# Patient Record
Sex: Male | Born: 1935 | Race: White | Hispanic: No | Smoking: Former smoker
Health system: Southern US, Community
[De-identification: ages and names within clinical notes are randomized; demographics above are authoritative.]

## PROBLEM LIST (undated history)

## (undated) DIAGNOSIS — Z8679 Personal history of other diseases of the circulatory system: Secondary | ICD-10-CM

## (undated) DIAGNOSIS — E785 Hyperlipidemia, unspecified: Secondary | ICD-10-CM

## (undated) DIAGNOSIS — J449 Chronic obstructive pulmonary disease, unspecified: Secondary | ICD-10-CM

## (undated) DIAGNOSIS — I10 Essential (primary) hypertension: Secondary | ICD-10-CM

## (undated) DIAGNOSIS — Z87898 Personal history of other specified conditions: Secondary | ICD-10-CM

## (undated) DIAGNOSIS — N529 Male erectile dysfunction, unspecified: Secondary | ICD-10-CM

## (undated) HISTORY — DX: Hyperlipidemia, unspecified: E78.5

## (undated) HISTORY — PX: JOINT REPLACEMENT: SHX530

## (undated) HISTORY — DX: Personal history of other diseases of the circulatory system: Z86.79

## (undated) HISTORY — DX: Personal history of other specified conditions: Z87.898

## (undated) HISTORY — DX: Chronic obstructive pulmonary disease, unspecified: J44.9

## (undated) HISTORY — DX: Male erectile dysfunction, unspecified: N52.9

## (undated) HISTORY — DX: Essential (primary) hypertension: I10

---
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
## 1999-04-13 HISTORY — PX: LITHOTRIPSY: SUR834

## 1999-04-13 HISTORY — PX: CATARACT EXTRACTION, BILATERAL: SHX1313

## 1999-06-08 ENCOUNTER — Ambulatory Visit (HOSPITAL_COMMUNITY): Admission: RE | Admit: 1999-06-08 | Discharge: 1999-06-08 | Payer: Self-pay | Admitting: Urology

## 1999-08-20 ENCOUNTER — Ambulatory Visit (HOSPITAL_BASED_OUTPATIENT_CLINIC_OR_DEPARTMENT_OTHER): Admission: RE | Admit: 1999-08-20 | Discharge: 1999-08-20 | Payer: Self-pay | Admitting: *Deleted

## 1999-08-20 ENCOUNTER — Encounter (INDEPENDENT_AMBULATORY_CARE_PROVIDER_SITE_OTHER): Payer: Self-pay | Admitting: *Deleted

## 1999-09-23 ENCOUNTER — Encounter (INDEPENDENT_AMBULATORY_CARE_PROVIDER_SITE_OTHER): Payer: Self-pay | Admitting: Specialist

## 1999-09-23 ENCOUNTER — Ambulatory Visit (HOSPITAL_BASED_OUTPATIENT_CLINIC_OR_DEPARTMENT_OTHER): Admission: RE | Admit: 1999-09-23 | Discharge: 1999-09-23 | Payer: Self-pay | Admitting: *Deleted

## 2002-04-23 ENCOUNTER — Ambulatory Visit (HOSPITAL_COMMUNITY): Admission: RE | Admit: 2002-04-23 | Discharge: 2002-04-23 | Payer: Self-pay | Admitting: Cardiology

## 2005-04-12 HISTORY — PX: TOTAL HIP ARTHROPLASTY: SHX124

## 2006-06-20 ENCOUNTER — Ambulatory Visit: Payer: Self-pay | Admitting: Internal Medicine

## 2006-08-15 ENCOUNTER — Inpatient Hospital Stay (HOSPITAL_COMMUNITY): Admission: EM | Admit: 2006-08-15 | Discharge: 2006-08-18 | Payer: Self-pay | Admitting: Emergency Medicine

## 2006-08-15 ENCOUNTER — Ambulatory Visit: Payer: Self-pay | Admitting: Internal Medicine

## 2006-09-06 ENCOUNTER — Ambulatory Visit: Payer: Self-pay | Admitting: Internal Medicine

## 2006-12-19 ENCOUNTER — Ambulatory Visit: Payer: Self-pay | Admitting: Internal Medicine

## 2007-05-23 ENCOUNTER — Ambulatory Visit (HOSPITAL_BASED_OUTPATIENT_CLINIC_OR_DEPARTMENT_OTHER): Admission: RE | Admit: 2007-05-23 | Discharge: 2007-05-23 | Payer: Self-pay | Admitting: Orthopedic Surgery

## 2007-06-19 ENCOUNTER — Ambulatory Visit: Payer: Self-pay | Admitting: Internal Medicine

## 2007-06-19 DIAGNOSIS — J439 Emphysema, unspecified: Secondary | ICD-10-CM | POA: Insufficient documentation

## 2007-08-20 ENCOUNTER — Encounter: Payer: Self-pay | Admitting: Internal Medicine

## 2007-09-28 ENCOUNTER — Ambulatory Visit: Payer: Self-pay | Admitting: Internal Medicine

## 2007-12-13 ENCOUNTER — Ambulatory Visit: Payer: Self-pay | Admitting: Internal Medicine

## 2008-05-08 ENCOUNTER — Telehealth: Payer: Self-pay | Admitting: Internal Medicine

## 2008-12-11 ENCOUNTER — Ambulatory Visit: Payer: Self-pay | Admitting: Internal Medicine

## 2009-04-18 ENCOUNTER — Telehealth: Payer: Self-pay | Admitting: Internal Medicine

## 2010-01-09 ENCOUNTER — Ambulatory Visit: Payer: Self-pay | Admitting: Internal Medicine

## 2010-01-09 DIAGNOSIS — E785 Hyperlipidemia, unspecified: Secondary | ICD-10-CM | POA: Insufficient documentation

## 2010-02-25 ENCOUNTER — Ambulatory Visit: Payer: Self-pay | Admitting: Cardiology

## 2010-03-27 ENCOUNTER — Ambulatory Visit: Payer: Self-pay | Admitting: Cardiology

## 2010-05-14 NOTE — Assessment & Plan Note (Signed)
Summary: rov 1 yr ///kp-Pt not seen-[facility] appt  Pt [facility] appt due to having another appt he had to get to.Reynaldo Minium CMA  December 10, 2009 10:56 AM  CC:  yearly follow up visit-COPD; denies any SOB or wheezing..  Preventive Screening-Counseling & Management  Alcohol-Tobacco     Smoking Status: quit     Year Quit: 2002     Pack years: 1ppd x 60 years  Current Medications (verified): 1)  Hytrin 2mg  .... Take 1 Tablet By Mouth Once A Day 2)  Zocor 80 Mg Tabs (Simvastatin) .... Take One By Mouth Once Daily 3)  Combivent 103-18 Mcg/act  Aero (Ipratropium-Albuterol) .... Inhale 2 Puffs Every 4 Hours As Needed 4)  Spiriva Handihaler 18 Mcg  Caps (Tiotropium Bromide Monohydrate) .... Inhale Contents of 1 Capsule Once A Day  Allergies (verified): 1)  ! Penicillin 2)  ! * Flu Vaccination  Social History: Pack years:  1ppd x 60 years  Vital Signs:  Patient profile:   77 year old male Height:      74 inches Weight:      216 pounds BMI:     27.83 O2 Sat:      94 % on Room air Pulse rate:   63 / minute BP sitting:   140 / 60  (left arm) Cuff size:   regular  Vitals Entered By: Reynaldo Minium CMA (December 10, 2009 10:02 AM)  O2 Flow:  Room air CC: yearly follow up visit-COPD; denies any SOB or wheezing.    Other Orders: No Charge Patient Arrived (NCPA0) (NCPA0)

## 2010-05-14 NOTE — Assessment & Plan Note (Signed)
Summary: rov 1 yr ///kp   Primary Provider/Referring Provider:  Delfin Edis  CC:  Annual Followup COPD.  Breathing okay and denies any complaints today.Marland Kitchen  History of Present Illness: 12/13/07- 77 year old man returning for follow-up of COPD.  We reviewed his medications and discussed upcoming flu vaccine this season.  He has a history of side effects with flu vaccine once before.he denies any significant change in his breathing.  Specifically, he denies change in cough, purulent or bloody sputum, chest pain or palpitation, adenopathy, or fever, leg pain, or edema.  December 27, 2008- COPD Uneventful year with no problems through cataract surgery recently. Denies cough, phlegm, chest pain, palpitation. Follows with DrTennant and says BP good with no cardiac concerns. Continues Spiriva, using combivent 2-3 x/ week, mostly when his nose is stuffy, with little chest tightness. Has never had pneumonia vac.  January 09, 2010- COPD Discussed how he has outlived 2 wives. Has done well with no major flares. He declines flu shot "I always get the flu from it". Uses daily Spiriva with no problems and only occasional Combivent. No routine cough or phlegm. Not aware of dyspnea, non limiting. He observes that he never tries to run. Dr Deborah Chalk serves as his primary doctor without cardiac problem. Admits rare cigarette- none in 6 months. May smoke one if he is around someone smoking. I explained why we feel it would be best if he stopped completely.   Preventive Screening-Counseling & Management  Alcohol-Tobacco     Smoking Status: quit  Comments: Rare social cigarette, once or twice a year.  Current Medications (verified): 1)  Hytrin 2mg  .... Take 1 Tablet By Mouth Once A Day 2)  Zocor 80 Mg Tabs (Simvastatin) .... Take One By Mouth Once Daily 3)  Combivent 103-18 Mcg/act  Aero (Ipratropium-Albuterol) .... Inhale 2 Puffs Every 4 Hours As Needed 4)  Spiriva Handihaler 18 Mcg  Caps (Tiotropium Bromide  Monohydrate) .... Inhale Contents of 1 Capsule Once A Day  Allergies (verified): 1)  ! Penicillin 2)  ! * Flu Vaccination  Past History:  Past Surgical History: Last updated: 2008-12-27 Carpal tunnel sgy R hip replacement tonsils cataract surgery  Family History: Last updated: 12-27-2008 Mother-deceased age 7; old age. Father- deceased age 1; cancer Brother-deceased age 55; killed by drunk driver Brother- living age 46 Brother-living age 45 Brother- living age 81 ]  Social History: Last updated: 10/11/2007 Patient states former smoker.- cigars. Quit 6 years ago ETOH- 2 drinks per day Widowed with 4 children .  Risk Factors: Smoking Status: quit (01/09/2010)  Past Medical History: COPD pneumonia Hyperlipidemia  Review of Systems      See HPI       The patient complains of shortness of breath with activity.  The patient denies shortness of breath at rest, productive cough, non-productive cough, coughing up blood, chest pain, irregular heartbeats, acid heartburn, indigestion, loss of appetite, weight change, abdominal pain, difficulty swallowing, sore throat, tooth/dental problems, headaches, nasal congestion/difficulty breathing through nose, sneezing, itching, ear ache, rash, change in color of mucus, and fever.    Vital Signs:  Patient profile:   77 year old male Weight:      214.25 pounds O2 Sat:      92 % on Room air Pulse rate:   67 / minute BP sitting:   150 / 70  (left arm)  Vitals Entered By: Vernie Murders (January 09, 2010 9:23 AM)  O2 Flow:  Room air CC: Annual Followup COPD.  Breathing okay and denies  any complaints today.   Physical Exam  Additional Exam:  General: A/Ox3; pleasant and cooperative, NAD, cigarette burn mark on shirt, no odor of tobacco. SKIN: no rash, lesions NODES: no lymphadenopathy HEENT: [place]/AT, EOM- WNL, Conjuctivae- clear, PERRLA, TM-WNL, Nose- clear, Throat- clear and wnl, Mallampati II NECK: Supple w/ fair ROM, JVD-  none, normal carotid impulses w/o bruits Thyroid-  CHEST: Clear to P&A, very clear with no cough, rub or dullness HEART: RRR, no m/g/r heard ABDOMEN: Soft and nl;  EAV:WUJW, nl pulses, no edema  NEURO: Grossly intact to observation      Impression & Recommendations:  Problem # 1:  COPD (ICD-496) He is well controlled and using meds appropriately. I encourage flu shot and strict smoking abstention, but he is convinced flu vaccine is not good for him, and smokes a very rare social cigarette that is not reallistically likely to do him in. We are updating his scripts.  We discussed his breathing in light of upcoming trip to [place] and [place].I did recommend abstinence from smoking.  Other Orders: Est. Patient Level III (11914)  Patient Instructions: 1)  Please schedule a follow-up appointment in 1 year. 2)  Please call if I can be helpful 3)  Scripts refilled. Prescriptions: SPIRIVA HANDIHALER 18 MCG  CAPS (TIOTROPIUM BROMIDE MONOHYDRATE) Inhale contents of 1 capsule once a day  #90 x 3   Entered and Authorized by:   Waymon Budge MD   Signed by:   Waymon Budge MD on 01/09/2010   Method used:   Print then Give to Patient   RxID:   [id number] COMBIVENT 103-18 MCG/ACT  AERO (IPRATROPIUM-ALBUTEROL) inhale 2 puffs every 4 hours as needed  #3 x 3   Entered and Authorized by:   Waymon Budge MD   Signed by:   Waymon Budge MD on 01/09/2010   Method used:   Print then Give to Patient   RxID:   [id number]

## 2010-05-14 NOTE — Progress Notes (Signed)
Summary: rx  Phone Note Call from Patient Call back at Home Phone [phone]   Caller: Patient Call For: Allen Beasley Reason for Call: Refill Medication Summary of Call: need written rx for Spiriva 90 day supply with refills for pt to send to mail order pharmacy.  Please mail to pt's home address. Initial call taken by: Eugene Gavia,  April 18, 2009 11:25 AM  Follow-up for Phone Call        rx given to CY to sign, will mail to pt house once signed. Pt aware.Carron Curie CMA  April 18, 2009 12:46 PM     Prescriptions: SPIRIVA HANDIHALER 18 MCG  CAPS (TIOTROPIUM BROMIDE MONOHYDRATE) Inhale contents of 1 capsule once a day  #90 x 3   Entered by:   Carron Curie CMA   Authorized by:   Waymon Budge MD   Signed by:   Carron Curie CMA on 04/18/2009   Method used:   Print then Mail to Patient   RxID:   [id number]

## 2010-07-31 ENCOUNTER — Other Ambulatory Visit: Payer: Self-pay | Admitting: *Deleted

## 2010-07-31 DIAGNOSIS — Z79899 Other long term (current) drug therapy: Secondary | ICD-10-CM

## 2010-07-31 DIAGNOSIS — E7800 Pure hypercholesterolemia, unspecified: Secondary | ICD-10-CM

## 2010-08-05 ENCOUNTER — Encounter: Payer: Self-pay | Admitting: *Deleted

## 2010-08-05 DIAGNOSIS — Z8679 Personal history of other diseases of the circulatory system: Secondary | ICD-10-CM | POA: Insufficient documentation

## 2010-08-05 DIAGNOSIS — Z87898 Personal history of other specified conditions: Secondary | ICD-10-CM | POA: Insufficient documentation

## 2010-08-05 DIAGNOSIS — N529 Male erectile dysfunction, unspecified: Secondary | ICD-10-CM | POA: Insufficient documentation

## 2010-08-05 DIAGNOSIS — I10 Essential (primary) hypertension: Secondary | ICD-10-CM

## 2010-08-07 ENCOUNTER — Ambulatory Visit (INDEPENDENT_AMBULATORY_CARE_PROVIDER_SITE_OTHER): Payer: Medicare Other | Admitting: Cardiology

## 2010-08-07 ENCOUNTER — Encounter: Payer: Self-pay | Admitting: Cardiology

## 2010-08-07 ENCOUNTER — Other Ambulatory Visit (INDEPENDENT_AMBULATORY_CARE_PROVIDER_SITE_OTHER): Payer: Medicare Other | Admitting: *Deleted

## 2010-08-07 DIAGNOSIS — E7800 Pure hypercholesterolemia, unspecified: Secondary | ICD-10-CM

## 2010-08-07 DIAGNOSIS — E785 Hyperlipidemia, unspecified: Secondary | ICD-10-CM

## 2010-08-07 DIAGNOSIS — Z79899 Other long term (current) drug therapy: Secondary | ICD-10-CM

## 2010-08-07 DIAGNOSIS — I10 Essential (primary) hypertension: Secondary | ICD-10-CM

## 2010-08-07 DIAGNOSIS — Z8679 Personal history of other diseases of the circulatory system: Secondary | ICD-10-CM

## 2010-08-07 LAB — LIPID PANEL
Cholesterol: 131 mg/dL (ref 0–200)
LDL Cholesterol: 65 mg/dL (ref 0–99)

## 2010-08-07 LAB — HEPATIC FUNCTION PANEL: Albumin: 4.1 g/dL (ref 3.5–5.2)

## 2010-08-07 LAB — BASIC METABOLIC PANEL
BUN: 28 mg/dL — ABNORMAL HIGH (ref 6–23)
Calcium: 10 mg/dL (ref 8.4–10.5)
Creatinine, Ser: 0.9 mg/dL (ref 0.4–1.5)
GFR: 87.56 mL/min (ref >60.00)
Glucose, Bld: 108 mg/dL — ABNORMAL HIGH (ref 70–99)
Sodium: 139 mEq/L (ref 135–145)

## 2010-08-07 NOTE — Assessment & Plan Note (Signed)
He had very mild aortic insufficiency by echocardiography in 2006. He clinically doesn't have a murmur.

## 2010-08-07 NOTE — Assessment & Plan Note (Signed)
Pressure slightly elevated today but in general it's been satisfactory. I will have him continue his current medicines with followup with Dr. Jacky Kindle.

## 2010-08-07 NOTE — Progress Notes (Signed)
Subjective:   Allen Beasley comes in today for followup visit. In general, he's been doing well he's returned from a trip to all [place] and [place]. He has occasional shortness of breath but no chest pain headache or dizziness. Lab work is pending today. We discussed about my retirement and I'll ask him to see primary care with Dr. Jacky Kindle.  He's had bilateral cataract extractions and lens implants in August and September of 2011. He has a history of hypertensive heart disease and hypercholesterolemia has been well managed. He has underlying COPD and has a right hip prosthesis from 1997. He had lithotripsy in 2001 tonsillectomy 1932. He was hospitalized for chest pain in 1996 at [facility] I care for his wife, Johnny Bridge, and we discussed those times.  Current Outpatient Prescriptions  Medication Sig Dispense Refill  . Ipratropium-Albuterol (COMBIVENT IN) Inhale 2 puffs into the lungs as needed.        Marland Kitchen losartan (COZAAR) 50 MG tablet Take 50 mg by mouth daily.        . sildenafil (VIAGRA) 50 MG tablet Take 25 mg by mouth. 1/2 tablet as needed       . simvastatin (ZOCOR) 80 MG tablet Take 80 mg by mouth at bedtime.        Marland Kitchen terazosin (HYTRIN) 2 MG capsule Take 2 mg by mouth at bedtime.        Marland Kitchen tiotropium (SPIRIVA) 18 MCG inhalation capsule Place 18 mcg into inhaler and inhale daily.          Allergies  Allergen Reactions  . Penicillins     Patient Active Problem List  Diagnoses  . HYPERLIPIDEMIA  . COPD  . HTN (hypertension)  . Erectile dysfunction  . History of chest pain  . History of aortic insufficiency    History  Smoking status  . Former Smoker -- 1.0 packs/day for 59 years  . Types: Cigarettes  . Quit date: 08/04/2001  Smokeless tobacco  . Not on file    History  Alcohol Use  . Yes    patient admits to alcohol unknown amout    Family History  Problem Relation Age of Onset  . Cancer Father   . Heart disease Father   . Heart attack Father     Review of Systems:    The patient denies any heat or cold intolerance.  No weight gain or weight loss.  The patient denies headaches or blurry vision.  There is no cough or sputum production.  The patient denies dizziness.  There is no hematuria or hematochezia.  The patient denies any muscle aches or arthritis.  The patient denies any rash.  The patient denies frequent falling or instability.  There is no history of depression or anxiety.  All other systems were reviewed and are negative.   Physical Exam:   Weight is 211. Blood pressure today is 158/68 off of medications today.The head is normocephalic and atraumatic.  Pupils are equally round and reactive to light.  Sclerae nonicteric.  Conjunctiva is clear.  Oropharynx is unremarkable.  There's adequate oral airway.  Neck is supple there are no masses.  Thyroid is not enlarged.  There is no lymphadenopathy.  Lungs are clear.  Chest is symmetric.  Heart shows a regular rate and rhythm.  S1 and S2 are normal.  There is no murmur click or gallop.  Abdomen is soft normal bowel sounds.  There is no organomegaly.  Genital and rectal deferred.  Extremities are without edema.  Peripheral pulses are adequate.  Neurologically intact.  Full range of motion.  The patient is not depressed.  Skin is warm and dry.  Assessment / Plan:

## 2010-08-07 NOTE — Assessment & Plan Note (Signed)
Refills were given on his lipid-lowering medicines and lab work is checked today. He is on Zocor 80.

## 2010-08-08 LAB — VITAMIN D 25 HYDROXY (VIT D DEFICIENCY, FRACTURES): Vit D, 25-Hydroxy: 17 ng/mL — ABNORMAL LOW (ref 30–89)

## 2010-08-10 ENCOUNTER — Telehealth: Payer: Self-pay | Admitting: Cardiology

## 2010-08-10 NOTE — Telephone Encounter (Signed)
Needs a Rx refill on Hytrin 2 mg instead of the cholesterol med that he asked for on Friday. Please call into [facility].

## 2010-08-11 ENCOUNTER — Telehealth: Payer: Self-pay | Admitting: *Deleted

## 2010-08-11 NOTE — Telephone Encounter (Signed)
Message copied by Barnetta Hammersmith on Tue Aug 11, 2010 12:49 PM ------      Message from: Roger Shelter      Created: Fri Aug 07, 2010  2:40 PM       OK

## 2010-08-11 NOTE — Telephone Encounter (Signed)
RN faxed new script for Hytrin 2 mg one po daily at bedtime #90 with 3 refills to [facility].

## 2010-08-11 NOTE — Progress Notes (Signed)
Does he need to start Vit D?

## 2010-08-12 NOTE — Telephone Encounter (Signed)
Pt notified of lab results and will f/u with Dr. Jacky Kindle for Vitamin D replacement.  RN faxed labs to Dr. Jacky Kindle.

## 2010-08-12 NOTE — Progress Notes (Signed)
Vitamin D 17

## 2010-08-13 ENCOUNTER — Telehealth: Payer: Self-pay | Admitting: *Deleted

## 2010-08-13 NOTE — Telephone Encounter (Signed)
Labs reported 

## 2010-08-25 NOTE — Op Note (Signed)
[name], Allen                ACCOUNT NO.:  [id number]   MEDICAL RECORD NO.:  [id number]          PATIENT TYPE:  AMB   LOCATION:  DSC                          FACILITY:  [facility]   PHYSICIAN:  Katy Fitch. Sypher, M.D. DATE OF BIRTH:  1923/05/03   DATE OF PROCEDURE:  05/23/2007  DATE OF DISCHARGE:                               OPERATIVE REPORT   PREOPERATIVE DIAGNOSIS:  Chronic severe right carpal tunnel syndrome.   POSTOPERATIVE DIAGNOSIS:  Chronic severe right carpal tunnel syndrome.   OPERATIONS:  Release of right transverse carpal ligament.   OPERATING SURGEON:  Josephine Igo, M.D.   ASSISTANT:  Annye Rusk, PA-C.   ANESTHESIA:  General by LMA.   SUPERVISING ANESTHESIOLOGIST:  Dr. Autumn Patty.   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 Delfin Edis  for evaluation and management of bilateral hand numbness and pain.  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   Electrodiagnostic studies confirmed bilateral severe carpal tunnel  syndrome.  A sed rate was obtained that was elevated at 32.  Uric acid  level was obtained and was noted to be normal.   Due to failure to respond to nonoperative measures, [name] is brought  to the operating room at this time for release of his right transverse  carpal ligament.   PROCEDURE:  [name] is brought to the operating room and placed  in supine position on the table.   Following the induction of general anesthesia by LMA technique, the  right arm was prepped with Betadine soap solution, sterilely draped.  A  pneumatic tourniquet was applied to the proximal right brachium.   Following exsanguination of right arm with Esmarch bandage, arterial  tourniquet was inflated to 250 mmHg due to mild systolic hypertension.   Procedure commenced with a short incision in line of the ring finger in  the palm.  The subcutaneous tissues were carefully divided revealing the  palmar fascia.  This was split in line of its fibers to reveal the  common sensory branch of the median nerve and the distal portion of the  ulnar bursa.  The bursa was quite edematous and inflamed.  This had the  appearance of a possible rheumatoid synovium.   The transverse carpal ligament isolated from the flexor tendons and the  median nerve.  The ligament was released along its ulnar border  extending into the distal forearm.  This widely opened the carpal canal.  No mass or other pigments were noted.   Bleeding points were electrocauterized by bipolar current followed by  repair of the skin with intradermal 3-0 Prolene.   Mr. Mathey tolerated surgery and anesthesia well.  For aftercare he is  provided a prescription for Percocet 5 mg one by mouth every four to six  hours as needed for pain 20 tablets without refill.  Also doxycycline  100  mg one by mouth twice daily x4 days as a perioperative prophylactic  antibiotic.      Katy Fitch Sypher, M.D.  Electronically Signed     RVS/MEDQ  D:  05/23/2007  T:  05/24/2007  Job:  [id number]

## 2010-08-25 NOTE — Assessment & Plan Note (Signed)
[place]  [facility]                             PULMONARY OFFICE NOTE   Allen Beasley, Allen Beasley                       MRN:          [id number]  DATE:09/06/2006                            DOB:          April 10, 1924    PROBLEM LIST:  1. Chronic obstructive pulmonary disease.  2. Recent pneumonia.   HISTORY:  He was hospitalized May 5 through May 9 with chronic  obstructive pulmonary disease exacerbation and pneumonia. He has now  finished antibiotics and returns for hospital followup reporting that he  feels fine. He shows me a Medrol 4 mg Dosepak that he got an Urgent  Care in February but never opened. We talked about its use. He does not  need it now. Chest x-ray May 7 at [facility] had shown improving aeration in  the left lung base with a bibasilar patchy atelectatic infiltrate. There  was a question of a nodular shadow or nipple shadow in the right lung  base and followup was recommended. He is coughing up a little sputum  now, no blood or purulent material, no chest pain. Feet have not been  swelling. He shows me an obvious bruise on his left deltoid area but  does not remember how he got it. He is not taking aspirin or  anticoagulants.   MEDICATIONS:  1. Hytrin 2 mg.  2. Vytorin.  3. Micardis 80 mg.  4. Combivent rescue inhaler.   He uses Dr. Deborah Chalk as his primary physician saying he has no history of  heart disease.   OBJECTIVE:  VITAL SIGNS:  Weight 207 pounds, blood pressure 136/62,  pulse 74, room air saturation 92%. A bruise about the size of a 50 cent  piece on his left deltoid.  LUNGS:  Breath sounds are diminished in the bases and cough with  laughter is mildly congested but not productive. There is no real  dullness, no rub.  HEART:  Sounds are regular, I do not hear murmur or gallops.  EXTREMITIES:  There is no cyanosis or clubbing, no edema.   IMPRESSION:  Chronic obstructive pulmonary disease after recent  exacerbation with pneumonia.   PLAN:  1. Chest x-ray for followup of his hospital film.  2. Try sample Spiriva 1 inhalation daily.  3. Schedule return in 4 months, earlier p.r.n.     Clinton D. Maple Hudson, MD, Tonny Bollman, FACP  Electronically Signed    CDY/MedQ  DD: 09/06/2006  DT: 09/07/2006  Job #: [id number]   cc:   Colleen Can. Deborah Chalk, M.D.

## 2010-08-25 NOTE — Assessment & Plan Note (Signed)
[place] [facility]                             PULMONARY OFFICE NOTE   Allen Beasley, CUEVA                       MRN:          [id number]  DATE:12/19/2006                            DOB:          [birth year]    PROBLEM:  Chronic obstructive pulmonary disease.   HISTORY:  Tolerated the hot summer weather without major problems, but  was bothered attempting to visit at altitude in [place] where exercise  tolerance was quite limited.  He does not cough up much phlegm and feels  stable now with no new issues.  Combivent inhaler is usually sufficient.  He tried Spiriva, but did not see an advantage.   MEDICATIONS:  1. Hytrin 2 mg.  2. Vytorin.  3. Spiriva, available, but not used regularly.  4. Combivent p.r.n.   ALLERGIES:  PENICILLIN AND FLU VACCINE.   OBJECTIVE:  VITAL SIGNS:  Weight 219 pounds, BP 120/70, pulse 60, room  air saturation 95%.  LUNGS:  Rattling cough.  Breath sounds are equal.  I do not hear  dullness, rub or rales.  NECK:  Neck veins are not distended.  There is no stridor.  HEART:  Regular without murmur.  EXTREMITIES:  No peripheral edema.   Chest x-ray in May had shown COPD with bibasilar atelectasis or  infiltrate at the time of pneumonia.  There is a questionable nodule or  summation shadow in the right lung base.  Study was repeated on May 27,  with no definite nodules.  Study repeated for this visit described as  stable exam with hyperinflation, interstitial chronic change and basilar  atelectasis or scarring.  Pulmonary function testing showed moderate  obstructive airways disease with some response to bronchodilator, normal  lung volumes, but moderately reduced diffusion.  FEV-1 was 1.92 (66%  predicted), ratio 0.49.  Flow volume lobe supports impression of  primarily emphysema.   IMPRESSION:  Chronic obstructive pulmonary disease/emphysema.  Followup  chest x-rays x2 have not demonstrated a nodule.   PLAN:  We refilled  Combivent and also Spiriva so that he would have it  available this winter.  Scheduled to return in 6 months, earlier p.r.n.     Clinton D. Maple Hudson, MD, Tonny Bollman, FACP  Electronically Signed    CDY/MedQ  DD: 12/25/2006  DT: 12/26/2006  Job #: [id number]   cc:   Colleen Can. Deborah Chalk, M.D.

## 2010-08-28 NOTE — Assessment & Plan Note (Signed)
[place] [facility]                             PULMONARY OFFICE NOTE   Allen Beasley, Allen Beasley                       MRN:          [id number]  DATE:06/20/2006                            DOB:          20-May-1923    PROBLEM:  1. Chronic obstructive pulmonary disease.  2. Coronary disease.   HISTORY:  I had last seen this gentleman at [facility] in  2005.  He is a former smoker, who was using Combivent as a  bronchodilator, having failed Advair and Spiriva.  He had worked with  Dr. Deborah Chalk for his coronary disease.  Pulmonary function testing in  2004 had shown mild obstruction with an FEV1 of 73% predicted.  He comes  now, having been treated at [facility] for pneumonia.  A  chest x-ray report from May 30, 2006, describes probable lingular  pneumonia with followup recommended.  He was referred here and he said  he feels much better now, back to normal.  Minor cough with no regular  sputum, no blood, no chest pain.  He is a little aware of dyspnea.  He  has had no ankle edema or adenopathy.  Only very occasionally has needed  his Combivent.   MEDICATION:  1. Hytrin.  2. Vytorin.  3. Combivent.   DRUG INTOLERANCE:  PENICILLIN and FLU VACCINE.   He had had his first pneumococcal vaccine in 2004.   OBJECTIVE:  Blood pressure 146/72, pulse 73, room air saturation 95%.  There is a slight raspy quality to his lung sounds, only when he  laughed, once.  Otherwise, chest sounded clear to quiet auscultation  with no dullness or rub.  Heart sounds were regular and normal.  There  was no adenopathy, no edema.   IMPRESSION:  COPD with resolved pneumonia in the lingula.   PLAN:  We are scheduling pulmonary function test and he will return in  mid-summer, earlier p.r.n.  He will need a pneumococcal vaccine booster  next year and we will want to get a followup chest x-ray.  All of this  was reviewed with him and he was in  agreement.     Clinton D. Maple Hudson, MD, Tonny Bollman, FACP  Electronically Signed   CDY/MedQ  DD: 06/23/2006  DT: 06/25/2006  Job #: [id number]   cc:   Louanna Raw

## 2010-08-28 NOTE — Discharge Summary (Signed)
Allen Beasley, Allen Beasley                ACCOUNT NO.:  [id number]   MEDICAL RECORD NO.:  [id number]          PATIENT TYPE:  INP   LOCATION:  5504                         FACILITY:  [facility]   PHYSICIAN:  Clinton D. Maple Hudson, MD, FCCP, FACPDATE OF BIRTH:  05-25-25   DATE OF ADMISSION:  08/15/2006  DATE OF DISCHARGE:  08/18/2006                               DISCHARGE SUMMARY   DISCHARGE DIAGNOSES:  1. Chronic obstructive pulmonary disease with acute exacerbation.  2. Pneumonia and organism not otherwise specified.  3. Hip joint replacement, status post.  4. History of skin malignancy.  5. Leukocytosis.   BRIEF HISTORY:  An 77 year old gentleman followed by Dr. Roger Shelter  for primary care and presenting with a 2-day history of increasing  dyspnea, productive cough, white sputum, low-grade fever, without chest  pain or hemoptysis.  Chest x-ray suggested bilateral pneumonia and he  was admitted for stabilization.   PAST MEDICAL HISTORY:  1. COPD.  2. Replacement, right hip.  3. Resection, basal cell carcinoma.  4. Previous tobacco abuse, ended 5 years ago.   PHYSICAL EXAM ON ADMISSION:  VITAL SIGNS:  Temperature 101.7,  respiratory rate 20-24, oxygen saturation 97% on a non-rebreather mask.  CHEST:  Hyperinflated chest, diminished breath sounds, faint crackles.  CARDIAC:  Regular heart sounds.  EXTREMITIES:  No edema or cyanosis.   HOSPITAL COURSE:  He was admitted to a medical bed and begun on Avelox  intravenously while continuing his outpatient medications and  supplemental oxygen.  He gradually progressed and was able to cough up a  large plug.  At discharge he remained somewhat congested but  substantially improved with little residual sputum.  Chest x-ray showed  some clearing.  Right lower lobe opacity marked for follow-up chest x-  ray.   LABORATORY:  Normal sinus rhythm on EKG.  Chest x-ray showed bibasilar  atelectatic infiltrative changes and mild improvement in  aeration of the  left lung base, question a nodule or summation shadow in the right lung  base.  The initial film had shown airspace disease more prominent on the  left but not excluding pneumonia.  Admission white blood count was  14,300 with a hemoglobin of 16.2 and normal platelet count, neutrophils  93%.  Admission BUN 23, creatinine 0.9.  B-natriuretic peptide less than  30.  Muscle enzymes showed no evidence of injury.   DISCHARGE PLANS:  He is to follow up with Dr. Maple Hudson in 2-3 weeks for  post-hospital follow-up and to see Dr. Deborah Chalk, who is listed as his  primary physician, as previously scheduled or needed.  Diet and activity  as tolerated.  He remains off cigarettes.   1. Avelox 400 mg daily for five more days.  2. Hytrin 2 mg daily.  3. Vytorin 10/40 mg one daily.  4. Rescue inhaler, either Combivent or albuterol 2 puffs q.i.d. p.r.n.   Activity as tolerated.      Clinton D. Maple Hudson, MD, Tonny Bollman, FACP  Electronically Signed     CDY/MEDQ  D:  09/28/2006  T:  09/29/2006  Job:  [id number]   cc:  Colleen Can. Deborah Chalk, M.D.

## 2010-08-28 NOTE — Op Note (Signed)
[place]  Patient:    GATLYN, LIPARI                       MRN: [id number] Proc. Date: 08/20/99 Adm. Date:  [id number] Attending:  Melody Comas.                           Operative Report  PREOPERATIVE DIAGNOSES: 1. Basal cell carcinoma of the nose, 1.0 cm. 2. 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  PROCEDURES PERFORMED: 1. Excisional biopsy of 1.0 cm basal cell carcinoma of the nasal dorsum. 2. Excisional biopsy of 1.0 cm Bowens disease of the right helical rim of the    auricle.  SURGEON:  Janet Berlin. Dan Humphreys, M.D.  ASSISTANT:  None.  ANESTHESIA:  Local.  INDICATIONS:  Brentlee Delage is a 77 year old man referred to me with previously biopsied nasal dorsum and right auricular helical rim skin lesions.  These were showing to be consistent with basal cell carcinoma and Bowens disease, respectively.  He was taken to operating room at this time for excisional biopsy.  DESCRIPTION OF OPERATION:  The patient was brought back to the minor surgery operating suite in the [facility] and placed on the table in a supine position with the head turned slightly to the left.  The right auricle and nose were sterilely prepped and draped.  I used a sterile surgical marking pen to outline the elliptically shaped excisions that I planned to perform with grossly clear tissue margins.  These were oriented, respectively, along the long axis of the helical rim and the long axis of the nasal dorsum. I then infiltrated each of these areas with 1% Xylocaine containing epinephrine.  After a suitable level of anesthesia had been obtained, I carried out excision first of the right auricular skin lesion.  This was excised full-thickness and oriented with a suture at its superior margin for the patient.  The specimen was passed off the operative field.  I closed the resulting defect with a running 5-0 monofilament nylon suture.  Attention  was then directed to the nasal dorsum, where the lesion was excised in a similar fashion.   This specimen was also oriented with a nylon suture at its superior margin.  Of note, was the fact that the patients dorsal nasal skin was heavily sebaceous and that quite a bit of cerumen was expressed as these sutures passed through the skin.  The defect created by the excision was closed with a running 5-0 monofilament nylon suture.  Bacitracin ointment was applied to each of the lesions, which were then covered with sterile band-aids. The patient was given wound care instructions.  I will see him back in the office next week for suture removal and review of his final pathology report. DD:  08/20/99 TD:  08/21/99 Job: [id number]

## 2010-08-28 NOTE — H&P (Signed)
Allen Beasley, DOWNS NO.:  [id number]   MEDICAL RECORD NO.:  [id number]          PATIENT TYPE:  EMS   LOCATION:  MAJO                         FACILITY:  [facility]   PHYSICIAN:  Oley Balm. Sung Amabile, MD   DATE OF BIRTH:  1923/09/16   DATE OF ADMISSION:  08/15/2006  DATE OF DISCHARGE:                              HISTORY & PHYSICAL   ADMISSION DIAGNOSES:  1. Chronic obstructive pulmonary disease.  2. Community-acquired pneumonia.   HISTORY OF PRESENT ILLNESS:  Mr. Allen Beasley is an 77 year old gentleman who  presents with a 2-day history of increasing shortness of breath, cough  productive of white sputum and subjective fevers.  He denies chest pain,  pressure, tightness and heaviness.  He has had no hemoptysis.  He denies  lower extremity edema and calf tenderness.  He denies nausea, vomiting,  diarrhea and dysuria.  He presented to the emergency department due to  increasing shortness of breath.  Chest x-ray suggested bilateral basilar  pneumonia.  Consequently pulmonary medicine is asked to admit him.  He  is followed by Dr. Jetty Duhamel for COPD and his primary care physician  is Dr. Roger Shelter.   PAST MEDICAL HISTORY:  1. COPD.  2. Total right hip replacement.  3. Basal cell carcinoma removed.   CURRENT MEDICATIONS:  1. Hytrin 2 mg p.o. q.h.s.  2. Micardis 80 mg p.o. q. day.  3. Vytorin unknown dose daily.  4. Combivent inhaler 2 sprays q.4h. p.r.n.   SOCIAL HISTORY:  He smoked a pack of cigarettes per day until  approximately five years ago.  He has no history of significant  occupational exposures.   FAMILY HISTORY:  Previously reviewed and noncontributory.   REVIEW OF SYSTEMS:  As per the history of present illness.  It is  otherwise unremarkable.   PHYSICAL EXAMINATION:  GENERAL:  He is well-developed and well-nourished  and in no overt distress.  Cognitive function is intact.  VITAL SIGNS:  Current temperature is 101.7.  Other vital signs are  normal with respiratory rate of 20-24 per minute.  Oxygen saturation 97%  on a nonrebreather mask.  HEENT:  Cranial nerves intact.  NECK:  Supple without adenopathy or jugular venous distention.  CHEST:  Examination reveals hyperresonance to percussion throughout.  Breath sounds are moderately diminished with faint bilateral basilar  crackles.  There is no egophony or bronchophony.  CARDIOVASCULAR:  Cardiac exam reveals a regular rate and rhythm with no  murmurs.  ABDOMEN: Soft, nontender with normal bowel sounds.  EXTREMITIES: Without clubbing, cyanosis and edema.  NEUROLOGIC:  Exam is without focal deficits.   DATA:  Chest x-ray reveals changes of COPD with bilateral basilar  infiltrates consistent with pneumonia.  No prior chest x-rays are  available for comparison.  B natruretic peptide is less than 30.  Chemistries are unremarkable.  White blood cell count is elevated at  14,300.  Cardiac panel is normal.   IMPRESSION:  1. Underlying chronic obstructive pulmonary disease - previous records      from Dr. Maple Hudson indicate that this is mild in severity.  His  baseline exercise tolerance is very good.  2. Community-acquired pneumonia, organism unknown - this most likely      represents a bacterial infection.   PLAN:  Due to as high oxygen requirements, I am going to admit him to a  step-down unit.  I suspect that he can be transferred to regular bed  very soon.  He will be treated with Avelox intravenously.  Nebulized  bronchodilators will be scheduled.  The rest of this medical regimen  will be maintained as is.  DVT prophylaxis will be provided with low  molecular weight heparin.      Oley Balm Sung Amabile, MD  Electronically Signed     DBS/MEDQ  D:  08/15/2006  T:  08/16/2006  Job:  [id number]   cc:   Colleen Can. Deborah Chalk, M.D.  Clinton D. Maple Hudson, MD, FCCP, FACP

## 2010-08-28 NOTE — Op Note (Signed)
[place]  Patient:    Allen Beasley, Allen Beasley                       MRN: [id number] Proc. Date: 09/23/99 Adm. Date:  [id number] Disc. Date: [id number] Attending:  Melody Comas.                           Operative Report  PREOPERATIVE DIAGNOSIS:  Bowens disease at the right helical rim.  POSTOPERATIVE DIAGNOSIS:  Bowens disease at the right helical rim.  OPERATION PERFORMED:  A 3 cm re-excision of area of Bowens disease of right auricular helical rim.  SURGEON:  Janet Berlin. Dan Humphreys, M.D.  ANESTHESIA:  Local.  INDICATIONS FOR PROCEDURE:  The patient is a couple of weeks out from excision of an area of Bowens disease involving the helical rim of his right ear. Final pathology showed the margins at the midpoint and the inferior extent of the incision to be potentially involved.  He was taken back to the operating room at this time for re-excision of the wound in attempt to establish clear margins.  DESCRIPTION OF PROCEDURE:  The patient was brought back to the minor surgery operating room at the [facility].  He was placed on the table in the supine position with the head turned to the left.  I sterilely prepped and draped the right ear.  I designed an elliptically shaped excision oriented along the long axis of the helical rim.  This fully incorporated the previous excision site and measured 3 cm in length.   I infiltrated the area to be excised with 1% Xylocaine containing epinephrine.  The skin was then excised full thickness as previously diagramed.  I placed a suture along the superior extent of the specimen to orient it for the pathologist.  The specimen was passed off the operative field for permanent sections.  The resulting defect was closed with a running 5-0 Monofilament nylon suture. This was dressed with bacitracin ointment and sterile surgical gauze, immobilized into position with surgical tape.  The patient was given  wound care instructions.  I will see him back in the office early next week for wound check, suture removal and review of his final pathology report. DD:  09/23/99 TD:  09/28/99 Job: [id number]

## 2010-11-30 ENCOUNTER — Telehealth: Payer: Self-pay | Admitting: Nurse Practitioner

## 2011-01-01 LAB — POCT HEMOGLOBIN-HEMACUE: Hemoglobin: 13.8

## 2011-01-06 ENCOUNTER — Encounter: Payer: Self-pay | Admitting: Internal Medicine

## 2011-01-08 ENCOUNTER — Ambulatory Visit: Payer: Self-pay | Admitting: Internal Medicine

## 2011-01-11 ENCOUNTER — Ambulatory Visit: Payer: Self-pay | Admitting: Internal Medicine

## 2011-02-12 ENCOUNTER — Ambulatory Visit (INDEPENDENT_AMBULATORY_CARE_PROVIDER_SITE_OTHER): Payer: Medicare Other | Admitting: Internal Medicine

## 2011-02-12 ENCOUNTER — Encounter: Payer: Self-pay | Admitting: Internal Medicine

## 2011-02-12 ENCOUNTER — Ambulatory Visit (INDEPENDENT_AMBULATORY_CARE_PROVIDER_SITE_OTHER)
Admission: RE | Admit: 2011-02-12 | Discharge: 2011-02-12 | Disposition: A | Discharge status: Home / Self Care | Payer: Medicare Other | Source: Ambulatory Visit | Attending: Internal Medicine | Admitting: Internal Medicine

## 2011-02-12 VITALS — BP 110/58 | HR 54 | Ht 74.0 in | Wt 216.0 lb

## 2011-02-12 DIAGNOSIS — J449 Chronic obstructive pulmonary disease, unspecified: Secondary | ICD-10-CM

## 2011-02-12 DIAGNOSIS — J4489 Other specified chronic obstructive pulmonary disease: Secondary | ICD-10-CM

## 2011-02-12 MED ORDER — SILDENAFIL CITRATE 50 MG PO TABS: ORAL_TABLET | ORAL | Status: DC

## 2011-02-12 NOTE — Patient Instructions (Addendum)
Order(ed)   CXR- dx COPD  Refill Viagra to be sent to [facility]  Since you are doing so well, it would be fine to follow with Dr Jacky Kindle for all your future needs and see me again only if needed for your breathing.

## 2011-02-12 NOTE — Progress Notes (Signed)
02/12/11- 77 year old male former smoker followed for COPD, complicated by history of aortic insufficiency, HBP LOV- 01/02/10 He declines flu vaccine which he says makes him sick. His newly established for primary care with Dr. Jacky Kindle. He denies any respiratory problems in the past year. Still smoking one cigarette every 6 months or so. Denies routine cough discolored sputum, chest pain, blood or nodes. Has traveled to [place] and [place]. Still uses Spiriva daily. Last chest x-ray 12/19/2006.  ROS-see HPI Constitutional:   No-   weight loss, night sweats, fevers, chills, fatigue, lassitude. HEENT:   No-  headaches, difficulty swallowing, tooth/dental problems, sore throat,       No-  sneezing, itching, ear ache, nasal congestion, post nasal drip,  CV:  No-   chest pain, orthopnea, PND, swelling in lower extremities, anasarca, dizziness, palpitations Resp: No-   shortness of breath with exertion or at rest.              No-   productive cough,  No non-productive cough,  No- coughing up of blood.              No-   change in color of mucus.  No- wheezing.   Skin: No-   rash or lesions. GI:  No-   heartburn, indigestion, abdominal pain, nausea, vomiting, diarrhea,                 change in bowel habits, loss of appetite GU: No-   dysuria, change in color of urine, no urgency or frequency.  No- flank pain. MS:  No-   joint pain or swelling.  No- decreased range of motion.  No- back pain. Neuro-     nothing unusual Psych:  No- change in mood or affect. No depression or anxiety.  No memory loss.  OBJ General- Alert, Oriented, Affect-appropriate, Distress- none acute, laconic, comfortable appearing Skin- rash-none, lesions- none, excoriation- none Lymphadenopathy- none Head- atraumatic            Eyes- Gross vision intact, PERRLA, conjunctivae clear secretions            Ears- Hearing, canals-normal            Nose- Clear, no-Septal dev, mucus, polyps, erosion, perforation   Throat- Mallampati II , mucosa clear , drainage- none, tonsils- atrophic Neck- flexible , trachea midline, no stridor , thyroid nl, carotid no bruit Chest - symmetrical excursion , unlabored           Heart/CV- RRR , no murmur , no gallop  , no rub, nl s1 s2                           - JVD- none , edema- none, stasis changes- none, varices- none           Lung- clear to P&A, wheeze- none, cough- none , dullness-none, rub- none           Chest wall-  Abd- tender-no, distended-no, bowel sounds-present, HSM- no Br/ Gen/ Rectal- Not done, not indicated Extrem- cyanosis- none, clubbing, none, atrophy- none, strength- nl Neuro- grossly intact to observation

## 2011-02-13 NOTE — Assessment & Plan Note (Signed)
He has been comfortable on Spiriva alone with no exacerbations in the past year. We don't really expect his 2 cigarettes per year to be important, although not encouraged. After discussion, I suggested he follow now with Dr. Jacky Kindle. We will get a chest x-ray today and report, otherwise he'll be happy to see him again as needed.

## 2011-02-17 NOTE — Progress Notes (Signed)
Quick Note:  LMTCB ______ 

## 2011-02-19 NOTE — Progress Notes (Signed)
Quick Note:  Pt aware of results. ______ 

## 2011-05-19 NOTE — Telephone Encounter (Signed)
Closed enc.

## 2011-05-28 DIAGNOSIS — J189 Pneumonia, unspecified organism: Secondary | ICD-10-CM | POA: Diagnosis not present

## 2011-05-28 DIAGNOSIS — J9801 Acute bronchospasm: Secondary | ICD-10-CM | POA: Diagnosis not present

## 2011-05-28 DIAGNOSIS — J019 Acute sinusitis, unspecified: Secondary | ICD-10-CM | POA: Diagnosis not present

## 2011-06-12 ENCOUNTER — Encounter (HOSPITAL_COMMUNITY): Payer: Self-pay | Admitting: *Deleted

## 2011-06-12 ENCOUNTER — Emergency Department (HOSPITAL_COMMUNITY): Payer: Medicare Other

## 2011-06-12 ENCOUNTER — Other Ambulatory Visit: Payer: Self-pay

## 2011-06-12 ENCOUNTER — Inpatient Hospital Stay (HOSPITAL_COMMUNITY)
Admission: EM | Admit: 2011-06-12 | Discharge: 2011-06-18 | DRG: 192 | Disposition: A | Discharge status: Skilled Nursing Facility | Payer: Medicare Other | Attending: Internal Medicine | Admitting: Internal Medicine

## 2011-06-12 DIAGNOSIS — Z880 Allergy status to penicillin: Secondary | ICD-10-CM | POA: Diagnosis not present

## 2011-06-12 DIAGNOSIS — R0902 Hypoxemia: Secondary | ICD-10-CM | POA: Diagnosis not present

## 2011-06-12 DIAGNOSIS — R05 Cough: Secondary | ICD-10-CM | POA: Diagnosis not present

## 2011-06-12 DIAGNOSIS — I10 Essential (primary) hypertension: Secondary | ICD-10-CM | POA: Diagnosis not present

## 2011-06-12 DIAGNOSIS — R269 Unspecified abnormalities of gait and mobility: Secondary | ICD-10-CM | POA: Diagnosis not present

## 2011-06-12 DIAGNOSIS — R0602 Shortness of breath: Secondary | ICD-10-CM | POA: Diagnosis not present

## 2011-06-12 DIAGNOSIS — I119 Hypertensive heart disease without heart failure: Secondary | ICD-10-CM | POA: Diagnosis not present

## 2011-06-12 DIAGNOSIS — R918 Other nonspecific abnormal finding of lung field: Secondary | ICD-10-CM | POA: Diagnosis not present

## 2011-06-12 DIAGNOSIS — Z87891 Personal history of nicotine dependence: Secondary | ICD-10-CM

## 2011-06-12 DIAGNOSIS — R279 Unspecified lack of coordination: Secondary | ICD-10-CM | POA: Diagnosis not present

## 2011-06-12 DIAGNOSIS — IMO0002 Reserved for concepts with insufficient information to code with codable children: Secondary | ICD-10-CM

## 2011-06-12 DIAGNOSIS — J984 Other disorders of lung: Secondary | ICD-10-CM | POA: Diagnosis not present

## 2011-06-12 DIAGNOSIS — E785 Hyperlipidemia, unspecified: Secondary | ICD-10-CM | POA: Diagnosis present

## 2011-06-12 DIAGNOSIS — J9600 Acute respiratory failure, unspecified whether with hypoxia or hypercapnia: Secondary | ICD-10-CM | POA: Diagnosis not present

## 2011-06-12 DIAGNOSIS — N400 Enlarged prostate without lower urinary tract symptoms: Secondary | ICD-10-CM | POA: Diagnosis present

## 2011-06-12 DIAGNOSIS — J441 Chronic obstructive pulmonary disease with (acute) exacerbation: Principal | ICD-10-CM | POA: Diagnosis present

## 2011-06-12 DIAGNOSIS — J449 Chronic obstructive pulmonary disease, unspecified: Secondary | ICD-10-CM | POA: Diagnosis not present

## 2011-06-12 DIAGNOSIS — J189 Pneumonia, unspecified organism: Secondary | ICD-10-CM | POA: Diagnosis not present

## 2011-06-12 DIAGNOSIS — J4489 Other specified chronic obstructive pulmonary disease: Secondary | ICD-10-CM | POA: Diagnosis not present

## 2011-06-12 DIAGNOSIS — Z96649 Presence of unspecified artificial hip joint: Secondary | ICD-10-CM

## 2011-06-12 DIAGNOSIS — J439 Emphysema, unspecified: Secondary | ICD-10-CM | POA: Insufficient documentation

## 2011-06-12 DIAGNOSIS — Z5189 Encounter for other specified aftercare: Secondary | ICD-10-CM | POA: Diagnosis not present

## 2011-06-12 LAB — CBC
HCT: 45.5 % (ref 39.0–52.0)
HCT: 42.6 % (ref 39.0–52.0)
Hemoglobin: 15.4 g/dL (ref 13.0–17.0)
Hemoglobin: 14.5 g/dL (ref 13.0–17.0)
MCH: 30.9 pg (ref 26.0–34.0)
MCHC: 34 g/dL (ref 30.0–36.0)
MCV: 91.5 fL (ref 78.0–100.0)
MCV: 90.8 fL (ref 78.0–100.0)
Platelets: 149 K/uL — ABNORMAL LOW (ref 150–400)
RBC: 4.69 MIL/uL (ref 4.22–5.81)
RDW: 13.3 % (ref 11.5–15.5)
WBC: 6.3 10*3/uL (ref 4.0–10.5)
WBC: 5.7 K/uL (ref 4.0–10.5)

## 2011-06-12 LAB — URINALYSIS, ROUTINE W REFLEX MICROSCOPIC
Glucose, UA: NEGATIVE mg/dL
Leukocytes, UA: NEGATIVE
Protein, ur: NEGATIVE mg/dL
Specific Gravity, Urine: 1.023 (ref 1.005–1.030)
Urobilinogen, UA: 0.2 mg/dL (ref 0.0–1.0)

## 2011-06-12 LAB — DIFFERENTIAL
Basophils Relative: 0 % (ref 0–1)
Eosinophils Relative: 1 % (ref 0–5)
Lymphocytes Relative: 17 % (ref 12–46)
Lymphs Abs: 1.1 10*3/uL (ref 0.7–4.0)
Monocytes Relative: 12 % (ref 3–12)
Neutro Abs: 4.3 10*3/uL (ref 1.7–7.7)

## 2011-06-12 LAB — BASIC METABOLIC PANEL
BUN: 29 mg/dL — ABNORMAL HIGH (ref 6–23)
CO2: 28 mEq/L (ref 19–32)
Chloride: 100 mEq/L (ref 96–112)
GFR calc Af Amer: >90 mL/min (ref >90)
Glucose, Bld: 144 mg/dL — ABNORMAL HIGH (ref 70–99)
Potassium: 3.9 mEq/L (ref 3.5–5.1)

## 2011-06-12 LAB — CREATININE, SERUM
Creatinine, Ser: 0.84 mg/dL (ref 0.50–1.35)
GFR calc Af Amer: 90 mL/min — ABNORMAL LOW (ref >90)
GFR calc non Af Amer: 78 mL/min — ABNORMAL LOW (ref >90)

## 2011-06-12 LAB — URINE MICROSCOPIC-ADD ON

## 2011-06-12 MED ORDER — ALBUTEROL SULFATE (5 MG/ML) 0.5% IN NEBU
10.0000 mg | INHALATION_SOLUTION | Freq: Once | RESPIRATORY_TRACT | Status: AC
  Administered 2011-06-12: 10 mg via RESPIRATORY_TRACT
  Filled 2011-06-12: qty 2

## 2011-06-12 MED ORDER — ONDANSETRON HCL 4 MG/2ML IJ SOLN: 4.0000 mg | Freq: Four times a day (QID) | INTRAMUSCULAR | Status: DC | PRN

## 2011-06-12 MED ORDER — ENOXAPARIN SODIUM 40 MG/0.4ML ~~LOC~~ SOLN
40.0000 mg | SUBCUTANEOUS | Status: DC
  Administered 2011-06-16: 40 mg via SUBCUTANEOUS
  Filled 2011-06-12 (×7): qty 0.4

## 2011-06-12 MED ORDER — TERAZOSIN HCL 2 MG PO CAPS
2.0000 mg | ORAL_CAPSULE | Freq: Every day | ORAL | Status: DC
  Administered 2011-06-12 – 2011-06-17 (×6): 2 mg via ORAL
  Filled 2011-06-12 (×7): qty 1

## 2011-06-12 MED ORDER — ALBUTEROL SULFATE (5 MG/ML) 0.5% IN NEBU
2.5000 mg | INHALATION_SOLUTION | RESPIRATORY_TRACT | Status: DC | PRN
  Filled 2011-06-12: qty 0.5

## 2011-06-12 MED ORDER — GUAIFENESIN ER 600 MG PO TB12
600.0000 mg | ORAL_TABLET | Freq: Two times a day (BID) | ORAL | Status: DC
  Administered 2011-06-12 – 2011-06-18 (×12): 600 mg via ORAL
  Filled 2011-06-12 (×13): qty 1

## 2011-06-12 MED ORDER — IPRATROPIUM BROMIDE 0.02 % IN SOLN
0.5000 mg | Freq: Once | RESPIRATORY_TRACT | Status: AC
  Administered 2011-06-12: 0.5 mg via RESPIRATORY_TRACT
  Filled 2011-06-12: qty 2.5

## 2011-06-12 MED ORDER — ATORVASTATIN CALCIUM 40 MG PO TABS
40.0000 mg | ORAL_TABLET | Freq: Every day | ORAL | Status: DC
  Administered 2011-06-13 – 2011-06-17 (×5): 40 mg via ORAL
  Filled 2011-06-12 (×6): qty 1

## 2011-06-12 MED ORDER — ZOLPIDEM TARTRATE 5 MG PO TABS: 5.0000 mg | ORAL_TABLET | Freq: Every evening | ORAL | Status: DC | PRN

## 2011-06-12 MED ORDER — ALBUTEROL SULFATE (5 MG/ML) 0.5% IN NEBU: 5.0000 mg | INHALATION_SOLUTION | Freq: Once | RESPIRATORY_TRACT | Status: DC

## 2011-06-12 MED ORDER — ONDANSETRON HCL 4 MG PO TABS: 4.0000 mg | ORAL_TABLET | Freq: Four times a day (QID) | ORAL | Status: DC | PRN

## 2011-06-12 MED ORDER — ACETAMINOPHEN 325 MG PO TABS
650.0000 mg | ORAL_TABLET | Freq: Four times a day (QID) | ORAL | Status: DC | PRN
  Filled 2011-06-12: qty 1

## 2011-06-12 MED ORDER — METHYLPREDNISOLONE SODIUM SUCC 125 MG IJ SOLR: 80.0000 mg | Freq: Once | INTRAMUSCULAR | Status: DC

## 2011-06-12 MED ORDER — SENNA 8.6 MG PO TABS
1.0000 | ORAL_TABLET | Freq: Two times a day (BID) | ORAL | Status: DC
  Administered 2011-06-13 – 2011-06-16 (×3): 8.6 mg via ORAL
  Filled 2011-06-12 (×13): qty 1

## 2011-06-12 MED ORDER — MOXIFLOXACIN HCL IN NACL 400 MG/250ML IV SOLN
400.0000 mg | INTRAVENOUS | Status: DC
  Administered 2011-06-13 (×2): 400 mg via INTRAVENOUS
  Filled 2011-06-12 (×3): qty 250

## 2011-06-12 MED ORDER — ACETAMINOPHEN 650 MG RE SUPP: 650.0000 mg | Freq: Four times a day (QID) | RECTAL | Status: DC | PRN

## 2011-06-12 MED ORDER — POTASSIUM CHLORIDE IN NACL 20-0.9 MEQ/L-% IV SOLN
INTRAVENOUS | Status: DC
  Administered 2011-06-13: 02:00:00 via INTRAVENOUS
  Administered 2011-06-13: 75 mL via INTRAVENOUS
  Filled 2011-06-12 (×4): qty 1000

## 2011-06-12 MED ORDER — PREDNISONE 20 MG PO TABS
20.0000 mg | ORAL_TABLET | Freq: Every day | ORAL | Status: DC
  Administered 2011-06-13: 20 mg via ORAL
  Filled 2011-06-12 (×3): qty 1

## 2011-06-12 MED ORDER — ALBUTEROL SULFATE (5 MG/ML) 0.5% IN NEBU
5.0000 mg | INHALATION_SOLUTION | Freq: Once | RESPIRATORY_TRACT | Status: AC
  Administered 2011-06-12: 5 mg via RESPIRATORY_TRACT
  Filled 2011-06-12: qty 1

## 2011-06-12 MED ORDER — METHYLPREDNISOLONE SODIUM SUCC 125 MG IJ SOLR
125.0000 mg | Freq: Once | INTRAMUSCULAR | Status: AC
  Administered 2011-06-12: 125 mg via INTRAVENOUS
  Filled 2011-06-12: qty 2

## 2011-06-12 MED ORDER — LOSARTAN POTASSIUM 50 MG PO TABS
50.0000 mg | ORAL_TABLET | Freq: Every day | ORAL | Status: DC
  Administered 2011-06-12 – 2011-06-18 (×7): 50 mg via ORAL
  Filled 2011-06-12 (×7): qty 1

## 2011-06-12 MED ORDER — TIOTROPIUM BROMIDE MONOHYDRATE 18 MCG IN CAPS
18.0000 ug | ORAL_CAPSULE | Freq: Every day | RESPIRATORY_TRACT | Status: DC
  Administered 2011-06-14 – 2011-06-18 (×5): 18 ug via RESPIRATORY_TRACT
  Filled 2011-06-12 (×2): qty 5

## 2011-06-12 MED ORDER — ALBUTEROL SULFATE (5 MG/ML) 0.5% IN NEBU: 2.5000 mg | INHALATION_SOLUTION | Freq: Once | RESPIRATORY_TRACT | Status: DC

## 2011-06-12 MED ORDER — ALUM & MAG HYDROXIDE-SIMETH 200-200-20 MG/5ML PO SUSP
30.0000 mL | Freq: Four times a day (QID) | ORAL | Status: DC | PRN
  Administered 2011-06-12: 30 mL via ORAL
  Filled 2011-06-12: qty 30

## 2011-06-12 MED ORDER — ALBUTEROL SULFATE (5 MG/ML) 0.5% IN NEBU
2.5000 mg | INHALATION_SOLUTION | Freq: Four times a day (QID) | RESPIRATORY_TRACT | Status: DC
  Administered 2011-06-12 – 2011-06-15 (×9): 2.5 mg via RESPIRATORY_TRACT
  Filled 2011-06-12 (×10): qty 0.5

## 2011-06-12 NOTE — ED Provider Notes (Signed)
Medical screening examination/treatment/procedure(s) were conducted as a shared visit with non-physician practitioner(s) and myself.  I personally evaluated the patient during the encounter  Patient with recent pneumonia, now cough, and shortness of breath with O2 sats below 80 to he improves with nasal canal oxygen in the ER. He appears to need overnight treatment with bronchodilators and oxygen. Prior to disposition .    Flint Melter, MD 06/12/11 1600

## 2011-06-12 NOTE — ED Notes (Signed)
Pt c/o sob x2 days, reports he was dx in [place] 2 wks ago w/pneumonia, he received an ABX shot IM, a prescription for inhaler, ABX pill x 10 days, and steroid pills. Pt also reports productive cough, unable to get anything up w/coughing but reports green colored mucus from his nose.

## 2011-06-12 NOTE — Progress Notes (Signed)
CHIMAOBI CASEBOLT [id number] Admission Data: 06/12/2011 8:17 PM Attending Provider: Minda Meo, MD  PCP:No primary provider on file. Consults/ Treatment Team:    Allen Beasley is a 78 y.o. male patient admitted from ED awake, alert  & orientated  X 3,  From home alone. VSS - Blood pressure 127/57, pulse 99, temperature 98.5 F (36.9 C), temperature source Oral, resp. rate 18, height 6\' 2"  (1.88 m), weight 87.091 kg (192 lb), SpO2 85.00%., O2   3L nasal cannular,  shortness of breath, no c/o chest pain, no distress noted.IV site WDL: Right AC SL with a transparent dsg that's clean dry and intact.  Allergies:   Allergies  Allergen Reactions  . Penicillins Hives and Swelling     Past Medical History  Diagnosis Date  . Hypertension   . Hyperlipidemia   . COPD (chronic obstructive pulmonary disease)   . Erectile dysfunction   . History of chest pain   . History of aortic insufficiency      Pt orientation to unit, room and routine. Information packet given to patient/family and safety video watched.  Admission INP armband ID verified with patient/family, and in place. SR up x 2, fall risk assessment complete with Patient and family verbalizing understanding of risks associated with falls. Pt verbalizes an understanding of how to use the call bell and to call for help before getting out of bed.  Skin, clean-dry- intact without evidence of bruising, or skin tears.   No evidence of skin break down noted on exam.    Will cont to monitor and assist as needed.  Cindra Eves, RN 06/12/2011 8:17 PM

## 2011-06-12 NOTE — ED Provider Notes (Signed)
History     CSN: [id number]  Arrival date & time 06/12/11  1126   First MD Initiated Contact with Patient 06/12/11 1200      Chief Complaint  Patient presents with  . Shortness of Breath    (Consider location/radiation/quality/duration/timing/severity/associated sxs/prior treatment) HPI  78 year old male with history of COPD who was recently diagnosed with pneumonia 2 weeks ago down in [place]. Patient said initially he was getting antibiotic, and steroid taper course. His symptoms seemed to improve significantly until last night when he noticed increased shortness of breath. He endorsed nonproductive cough.  Patient denies fever, chills, nausea, vomiting, diarrhea, or abdominal pain. He denies chest pain or back pain. He does not use oxygen at home. He is a former smoker but has quit for the past 10-12 years.  Past Medical History  Diagnosis Date  . Hypertension   . Hyperlipidemia   . COPD (chronic obstructive pulmonary disease)   . Erectile dysfunction   . History of chest pain   . History of aortic insufficiency     Past Surgical History  Procedure Date  . Cataract extraction, bilateral 2001  . Lithotripsy 2001  . Tonsilectomy, adenoidectomy, bilateral myringotomy and tubes 1932  . Total hip arthroplasty 07    right    Family History  Problem Relation Age of Onset  . Cancer Father   . Heart disease Father   . Heart attack Father     History  Substance Use Topics  . Smoking status: Former Smoker -- 1.0 packs/day for 59 years    Types: Cigarettes    Quit date: 08/04/2001  . Smokeless tobacco: Not on file  . Alcohol Use: Yes     patient admits to alcohol unknown amout      Review of Systems  All other systems reviewed and are negative.    Allergies  Penicillins  Home Medications   Current Outpatient Rx  Name Route Sig Dispense Refill  . COMBIVENT IN Inhalation Inhale 2 puffs into the lungs as needed.      Marland Kitchen LOSARTAN POTASSIUM 50 MG PO TABS Oral  Take 50 mg by mouth daily.      Marland Kitchen SILDENAFIL CITRATE 50 MG PO TABS  1/2 tablet as needed 50 tablet 3  . SIMVASTATIN 80 MG PO TABS Oral Take 80 mg by mouth at bedtime.      . TERAZOSIN HCL 2 MG PO CAPS Oral Take 2 mg by mouth at bedtime.      Marland Kitchen TIOTROPIUM BROMIDE MONOHYDRATE 18 MCG IN CAPS Inhalation Place 18 mcg into inhaler and inhale daily.        BP 119/51  Pulse 74  Temp(Src) 97.6 F (36.4 C) (Oral)  Resp 24  SpO2 93%  Physical Exam  Nursing note and vitals reviewed. Constitutional: He appears well-developed and well-nourished. No distress.       Awake, alert, nontoxic appearance  HENT:  Head: Atraumatic.  Eyes: Conjunctivae are normal. Right eye exhibits no discharge. Left eye exhibits no discharge.  Neck: Normal range of motion. Neck supple.  Cardiovascular: Normal rate and regular rhythm.   Pulmonary/Chest: No accessory muscle usage. Tachypnea noted. No respiratory distress. He has decreased breath sounds. He has wheezes. He has rhonchi. He has rales. He exhibits no tenderness.  Abdominal: Soft. There is no tenderness. There is no rebound.  Musculoskeletal: He exhibits no tenderness.       ROM appears intact, no obvious focal weakness  Neurological: He is alert.  Skin: Skin is  warm and dry. No rash noted.  Psychiatric: He has a normal mood and affect.    ED Course  Procedures (including critical care time)  Labs Reviewed - No data to display No results found.   No diagnosis found.  Dg Chest 2 View  06/12/2011  *RADIOLOGY REPORT*  Clinical Data: 78 year old male with cough and shortness of breath.  CHEST - 2 VIEW  Comparison: 02/12/2011 and prior chest radiographs dating back to 08/15/2006  Findings: The cardiomediastinal silhouette is unchanged. Chronic opacities/scarring at the lung bases are again noted. There is no evidence of focal airspace disease, pulmonary edema, suspicious pulmonary nodule/mass, pleural effusion, or pneumothorax. No acute bony abnormalities are  identified.  IMPRESSION: No evidence of acute cardiopulmonary disease.  Chronic bibasilar pulmonary changes/scarring.  Original Report Authenticated By: Rosendo Gros, M.D.   Results for orders placed during the hospital encounter of 06/12/11  CBC      Component Value Range   WBC 6.3  4.0 - 10.5 (K/uL)   RBC 4.97  4.22 - 5.81 (MIL/uL)   Hemoglobin 15.4  13.0 - 17.0 (g/dL)   HCT 13.0  86.5 - 78.4 (%)   MCV 91.5  78.0 - 100.0 (fL)   MCH 31.0  26.0 - 34.0 (pg)   MCHC 33.8  30.0 - 36.0 (g/dL)   RDW 69.6  29.5 - 28.4 (%)   Platelets 162  150 - 400 (K/uL)  DIFFERENTIAL      Component Value Range   Neutrophils Relative 70  43 - 77 (%)   Lymphocytes Relative 17  12 - 46 (%)   Monocytes Relative 12  3 - 12 (%)   Eosinophils Relative 1  0 - 5 (%)   Basophils Relative 0  0 - 1 (%)   Neutro Abs 4.3  1.7 - 7.7 (K/uL)   Lymphs Abs 1.1  0.7 - 4.0 (K/uL)   Monocytes Absolute 0.8  0.1 - 1.0 (K/uL)   Eosinophils Absolute 0.1  0.0 - 0.7 (K/uL)   Basophils Absolute 0.0  0.0 - 0.1 (K/uL)   WBC Morphology ATYPICAL LYMPHOCYTES    BASIC METABOLIC PANEL      Component Value Range   Sodium 138  135 - 145 (mEq/L)   Potassium 3.9  3.5 - 5.1 (mEq/L)   Chloride 100  96 - 112 (mEq/L)   CO2 28  19 - 32 (mEq/L)   Glucose, Bld 144 (*) 70 - 99 (mg/dL)   BUN 29 (*) 6 - 23 (mg/dL)   Creatinine, Ser 1.32  0.50 - 1.35 (mg/dL)   Calcium 9.3  8.4 - 44.0 (mg/dL)   GFR calc non Af Amer 83 (*) >90 (mL/min)   GFR calc Af Amer >90  >90 (mL/min)   Dg Chest 2 View  06/12/2011  *RADIOLOGY REPORT*  Clinical Data: 78 year old male with cough and shortness of breath.  CHEST - 2 VIEW  Comparison: 02/12/2011 and prior chest radiographs dating back to 08/15/2006  Findings: The cardiomediastinal silhouette is unchanged. Chronic opacities/scarring at the lung bases are again noted. There is no evidence of focal airspace disease, pulmonary edema, suspicious pulmonary nodule/mass, pleural effusion, or pneumothorax. No acute bony  abnormalities are identified.  IMPRESSION: No evidence of acute cardiopulmonary disease.  Chronic bibasilar pulmonary changes/scarring.  Original Report Authenticated By: Rosendo Gros, M.D.    Date: 06/12/2011  Rate: 73  Rhythm: normal sinus rhythm  QRS Axis: normal  Intervals: normal  ST/T Wave abnormalities: normal  Conduction Disutrbances:none  Narrative Interpretation:  Old EKG Reviewed: unchanged      MDM  Recent diagnosis of pneumonia, likely community acquired as patient denies any recent hospitalization. Initially his oxygenation saturation was 81% on room air. Patient is currently on 2-L of NS.  Oxygenation improves to 92%. He appears to be in no acute respiratory distress. He is able to speak 5-10 words per sentence. Mildly increased respiratory rate to 24.  CXR, ECG, UA, CBC, BMP ordered.  Albuterol/atrovent nebs and solu-medrol given.  No hx of CHF or having lower extremities swelling today.    12:43 PM Chest x-ray shows no evidence of acute cardiopulmonary disease.  BUN mildly elevated at 29, but that's his baseline.  Plan for admission due to hypoxia.  Discussed plan with my attending, who will contact [facility] for admission.  Pt is aware of plan.  He is currently in no obvious resp distress.   4:02 PM [facility] oncall doctor, Dr. Neale Burly, who agrees to see pt in ED.    Fayrene Helper, PA-C 06/12/11 1608

## 2011-06-12 NOTE — H&P (Signed)
NAMEBALEN, WOOLUM NO.:  [id number]  MEDICAL RECORD NO.:  [id number]  LOCATION:  5507                         FACILITY:  [facility]  PHYSICIAN:  Gaspar Garbe, M.D.DATE OF BIRTH:  06/15/1923  DATE OF ADMISSION:  06/12/2011 DATE OF DISCHARGE:                             HISTORY & PHYSICAL   CHIEF COMPLAINT:  COPD exacerbation.  HISTORY OF PRESENT ILLNESS:  The patient is an 78 year old white male, former smoker, who is normally followed by Dr. Jacky Kindle at [facility].  He indicates that he was down in [place] in the [address] area visiting his brother and 2 weeks ago was treated for pneumonia.  He was given antibiotics as well as prednisone.  He has returned to [facility] and felt reasonably well until last night when he became more short of breath.  He indicated that he took his Spiriva this morning, but did not try anything else and continued to have shortness of breath, and thus came to the emergency room.  When he was initially seen, his sats were in the low 80s on room air.  He was not able to be weaned off of oxygen and had 1 set of nebulizer treatments.  No steroids were provided by the ER physician/resident, and I was asked to come, admit the patient.  Upon seeing the patient, he did not look to be in any distress, but his breathing was somewhat coarsened and medications were initiated as below.  He is currently by himself and does not have any family members with him.  PAST MEDICAL HISTORY:  Essential hypertension, hyperlipidemia, osteoarthritis, COPD.  PAST SURGICAL HISTORY:  Hip replacement in 1997.  SOCIAL HISTORY:  The patient is widowed.  He has 4 sons, all in good health.  He has a Education officer, community, and he is retired from the [employer].  He has worked for the [employer] for 20+ years.  FAMILY HISTORY:  Father died at age 20 from prostate cancer with a history of hypertension and coronary artery disease.  Mother died at  age 19 from natural causes with a history of cancer, and a brother who was killed by a drunk driver.  He has another brother who has a history of memory loss and another who is younger than him, who is in a good state of health.  MEDICATIONS: 1. Terazosin 2 mg p.o. at bedtime. 2. Zocor 80 mg at bedtime. 3. Spiriva 18 mcg 1 inhalation daily. 4. Combivent 2 puffs as needed. 5. Losartan 50 mg once daily.  ALLERGIES:  PENICILLIN causes swelling of the throat.  REVIEW OF SYSTEMS:  The patient denies any fevers, chills, or sweats. Denies any headache.  Indicates shortness of breath and dyspnea on exertion with some wheezing earlier today.  Denies any chest pain.  No abdominal symptoms.  Otherwise, neurologically intact with no other distress.  REVIEW OF SYSTEMS:  Otherwise, within normal limits.  PHYSICAL EXAMINATION:  VITAL SIGNS:  Temperature 97.6, pulse 63, respiratory rate 16, blood pressure 109/48, satting 87% on 2 L.  He was brought up to 4 L upon my arrival. GENERAL:  In no acute distress. HEENT:  Normocephalic, atraumatic.  PERRLA.  EOMI.  ENT is within normal limits. NECK:  Supple.  No lymphadenopathy, JVD, or bruit. HEART:  Regular rate and rhythm. LUNGS:  The patient has decreased breath sounds bilaterally and wheezes with forced expiration.  He has no other adventitial sounds. ABDOMEN:  Soft, nontender, normoactive bowel sounds. EXTREMITIES:  No clubbing, cyanosis, or edema. NEUROLOGIC:  The patient is oriented to person, place, and time without deficits. SKIN:  Otherwise intact. MUSCULOSKELETAL:  No focal deficits noted.  No weakness.  IMAGING:  The patient underwent a chest x-ray, which showed no evidence of acute pulmonary disease and chronic bibasilar pulmonary changes and scarring.  LABORATORY TESTING:  White count 6.3, hemoglobin 15.4, hematocrit 45.5. BUN elevated at 29 and creatinine 0.72.  Electrolytes otherwise within normal limits.  Urinalysis showed 0-2  white blood cells and 7-10 red blood cells, but was otherwise dipstick negative.  A 12-lead EKG was normal.  ASSESSMENT AND PLAN: 1. Chronic obstructive pulmonary disease exacerbation.  Given that the     patient is initially short of breath upon my seeing him and has not     received steroids.  I have written for Depo-Medrol and another     course of albuterol.  He will be on a non-telemetry bed upstairs,     and I have written for him to have albuterol every 6 hours and     every 2 hours as needed.  We will continue his Spiriva daily.  He     will also be put on oral prednisone after his initial Depo-Medrol     dose.  He may benefit from inhaled corticosteroid once tapered off     of prednisone.  He clearly does not have a concept of rescue     inhaler and Combivent is clearly the wrong inhaler for him given     that he is on Spiriva at baseline.  He would much better be served     by albuterol and further teaching. 2. Hypertension.  Blood pressure is currently somewhat low, but he is     also prerenal, will receive IV fluids. 3. Prerenal azotemia.  We will hydrate with normal saline with 20 mEq     KCl at 75 mL an hour. 4. Hyperlipidemia.  We will substitute his Zocor 80 from Lipitor 40     per formulae. 5. The patient has Lovenox for deep vein thrombosis prophylaxis. 6. The patient will likely be able to go home in the couple of days if     his breathing continues, requires considerable education on what to     do for rescue.  In addition, he was started on empiric Avelox, and     we will repeat another chest x-ray in the morning.  He indicated     that he was on an antibiotic.  With his PENICILLIN     allergy, I am not exactly sure what he was on because he cannot     remember the name of it and when names were reviewed, he could not     tell what they were.  This was dispensed in [place].  Condition on     admission stable.     Gaspar Garbe, M.D.     RWT/MEDQ  D:   06/12/2011  T:  06/12/2011  Job:  [id number]

## 2011-06-12 NOTE — ED Provider Notes (Signed)
Medical screening examination/treatment/procedure(s) were conducted as a shared visit with non-physician practitioner(s) and myself.  I personally evaluated the patient during the encounter  Flint Melter, MD 06/12/11 [phone]

## 2011-06-12 NOTE — ED Notes (Signed)
Pt brought back from triage. Undressed and in a gown. Cardiac monitor, pulse oximetry and blood pressure cuff on.

## 2011-06-12 NOTE — ED Notes (Signed)
Reports sob x 2 weeks, was dx with pneumonia and took antibiotics for it. Still having productive cough. spo2 81% on room air at triage.

## 2011-06-12 NOTE — ED Notes (Signed)
Westly Pam RT called for tx

## 2011-06-13 ENCOUNTER — Inpatient Hospital Stay (HOSPITAL_COMMUNITY): Payer: Medicare Other

## 2011-06-13 LAB — COMPREHENSIVE METABOLIC PANEL
ALT: 11 U/L (ref 0–53)
Alkaline Phosphatase: 49 U/L (ref 39–117)
CO2: 28 mEq/L (ref 19–32)
Chloride: 103 mEq/L (ref 96–112)
GFR calc Af Amer: >90 mL/min (ref >90)
GFR calc non Af Amer: 80 mL/min — ABNORMAL LOW (ref >90)
Glucose, Bld: 117 mg/dL — ABNORMAL HIGH (ref 70–99)
Potassium: 4 mEq/L (ref 3.5–5.1)
Sodium: 139 mEq/L (ref 135–145)

## 2011-06-13 LAB — CBC
MCHC: 33.4 g/dL (ref 30.0–36.0)
RDW: 13 % (ref 11.5–15.5)

## 2011-06-13 NOTE — Progress Notes (Signed)
Subjective: Doing a bit better.   Still has not had his AM CXR.  Eating ok, not needing extra nebs.  Objective: Vital signs in last 24 hours: Temp:  [97.4 F (36.3 C)-98.5 F (36.9 C)] 97.4 F (36.3 C) (03/03 0517) Pulse Rate:  [55-99] 55  (03/03 0517) Resp:  [16-24] 20  (03/03 0517) BP: (105-127)/(48-57) 105/49 mmHg (03/03 0517) SpO2:  [81 %-100 %] 94 % (03/03 0517) Weight:  [87.091 kg (192 lb)] 87.091 kg (192 lb) (03/02 1820) Weight change:  Last BM Date: 06/12/11  Intake/Output from previous day: 03/02 0701 - 03/03 0700 In: 505 [P.O.:240; I.V.:265] Out: -  Intake/Output this shift:    General appearance: alert, cooperative and appears stated age Throat: lips, mucosa, and tongue normal; teeth and gums normal Resp: Coarse on R, better aeration, still diminished on Left Cardio: regular rate and rhythm, S1, S2 normal, no murmur, click, rub or gallop GI: soft, non-tender; bowel sounds normal; no masses,  no organomegaly Skin: Skin color, texture, turgor normal. No rashes or lesions Neurologic: Grossly normal  Lab Results:  Basename 06/13/11 0643 06/12/11 1813  WBC 6.7 5.7  HGB 13.1 14.5  HCT 39.2 42.6  PLT 180 149*   BMET  Basename 06/13/11 0643 06/12/11 1813 06/12/11 1200  NA 139 -- 138  K 4.0 -- 3.9  CL 103 -- 100  CO2 28 -- 28  GLUCOSE 117* -- 144*  BUN 27* -- 29*  CREATININE 0.77 0.84 --  CALCIUM 8.7 -- 9.3    Studies/Results: Dg Chest 2 View  06/12/2011  *RADIOLOGY REPORT*  Clinical Data: 78 year old male with cough and shortness of breath.  CHEST - 2 VIEW  Comparison: 02/12/2011 and prior chest radiographs dating back to 08/15/2006  Findings: The cardiomediastinal silhouette is unchanged. Chronic opacities/scarring at the lung bases are again noted. There is no evidence of focal airspace disease, pulmonary edema, suspicious pulmonary nodule/mass, pleural effusion, or pneumothorax. No acute bony abnormalities are identified.  IMPRESSION: No evidence of acute  cardiopulmonary disease.  Chronic bibasilar pulmonary changes/scarring.  Original Report Authenticated By: Rosendo Gros, M.D.    Medications:  I have reviewed the patient's current medications. Scheduled:   . albuterol  10 mg Nebulization Once  . albuterol  2.5 mg Nebulization Q6H  . albuterol  2.5 mg Nebulization Once  . albuterol  5 mg Nebulization Once  . atorvastatin  40 mg Oral q1800  . enoxaparin  40 mg Subcutaneous Q24H  . guaiFENesin  600 mg Oral BID  . ipratropium  0.5 mg Nebulization Once  . ipratropium  0.5 mg Nebulization Once  . losartan  50 mg Oral Daily  . methylPREDNISolone (SOLU-MEDROL) injection  125 mg Intravenous Once  . moxifloxacin  400 mg Intravenous Q24H  . predniSONE  20 mg Oral Q breakfast  . senna  1 tablet Oral BID  . terazosin  2 mg Oral QHS  . tiotropium  18 mcg Inhalation Daily  . DISCONTD: albuterol  5 mg Nebulization Once  . DISCONTD: methylPREDNISolone (SOLU-MEDROL) injection  80 mg Intravenous Once   Continuous:   . 0.9 % NaCl with KCl 20 mEq / L 75 mL/hr at 06/13/11 0228   RUE:[id number], acetaminophen, albuterol, alum & mag hydroxide-simeth, ondansetron (ZOFRAN) IV, ondansetron, zolpidem  Assessment/Plan: COPD exacerbation.  Steroids orally, nebs, continue Spiriva, will likely benefit from ICS once off oral steroids as step up.  Instruction on rescue also needed.  CXR pending for this AM, empiric Avelox No other med changes  LOS: 1 day   Veola Cafaro W 06/13/2011, 9:03 AM

## 2011-06-14 MED ORDER — MOXIFLOXACIN HCL 400 MG PO TABS
400.0000 mg | ORAL_TABLET | Freq: Every day | ORAL | Status: DC
  Administered 2011-06-14 – 2011-06-17 (×4): 400 mg via ORAL
  Filled 2011-06-14 (×5): qty 1

## 2011-06-14 MED ORDER — PREDNISONE 20 MG PO TABS
20.0000 mg | ORAL_TABLET | Freq: Two times a day (BID) | ORAL | Status: DC
  Administered 2011-06-14 – 2011-06-18 (×8): 20 mg via ORAL
  Filled 2011-06-14 (×10): qty 1

## 2011-06-14 NOTE — Progress Notes (Signed)
Subjective: Allen Beasley seems to be doing well. He sitting on the edge of the bed and clearly states he felt better than he felt is going to [place]. He is not back to baseline as yet. He does live alone. He is now considering home with home health versus skilled nursing this will occur within the next one to 2 days.  Objective: Vital signs in last 24 hours: Temp:  [97.6 F (36.4 C)-97.9 F (36.6 C)] 97.6 F (36.4 C) (03/04 0522) Pulse Rate:  [61-75] 69  (03/04 0522) Resp:  [18-20] 20  (03/04 0522) BP: (119-126)/(51-64) 126/60 mmHg (03/04 0522) SpO2:  [85 %-94 %] 94 % (03/04 0522) Weight change:   CBG (last 3)  No results found for this basename: GLUCAP:3 in the last 72 hours  Intake/Output from previous day: 03/03 0701 - 03/04 0700 In: 2520 [P.O.:720; I.V.:1800] Out: 250 [Urine:250]  Physical Exam: Patient is awake alert no distress. Good facial symmetry. No JVD or bruits. He does have a very moist cough. Pulmonary exam reveals a prolonged expiratory phase wheezing at the left base few rales at the right base. Cardiovascular exam distant heart sounds normal S1-S2 no murmur. Abdomen is soft and nontender. Extremities no edema intact pulses. Neurologically he is nonlateralizing   Lab Results:  Basename 06/13/11 0643 06/12/11 1813 06/12/11 1200  NA 139 -- 138  K 4.0 -- 3.9  CL 103 -- 100  CO2 28 -- 28  GLUCOSE 117* -- 144*  BUN 27* -- 29*  CREATININE 0.77 0.84 --  CALCIUM 8.7 -- 9.3  MG -- -- --  PHOS -- -- --    Basename 06/13/11 0643  AST 14  ALT 11  ALKPHOS 49  BILITOT 0.3  PROT 5.9*  ALBUMIN 2.9*    Basename 06/13/11 0643 06/12/11 1813 06/12/11 1200  WBC 6.7 5.7 --  NEUTROABS -- -- 4.3  HGB 13.1 14.5 --  HCT 39.2 42.6 --  MCV 90.1 90.8 --  PLT 180 149* --   No results found for this basename: INR, PROTIME   No results found for this basename: CKTOTAL:3,CKMB:3,CKMBINDEX:3,TROPONINI:3 in the last 72 hours No results found for this basename:  TSH,T4TOTAL,FREET3,T3FREE,THYROIDAB in the last 72 hours No results found for this basename: VITAMINB12:2,FOLATE:2,FERRITIN:2,TIBC:2,IRON:2,RETICCTPCT:2 in the last 72 hours  Studies/Results: X-ray Chest Pa And Lateral   06/13/2011  *RADIOLOGY REPORT*  Clinical Data: Cough, pneumonia  CHEST - 2 VIEW  Comparison: Chest radiograph 06/12/2011  Findings: Normal mediastinum and cardiac silhouette.  There is opacity over the left cardiac border which is present on multiple comparison exams.  Lungs are hyperinflated.  No effusion or pneumothorax.  IMPRESSION:  1.  No acute findings or change from prior. 2.  Hyperinflated lungs with chronic basilar opacities.  Original Report Authenticated By: Genevive Bi, M.D.   Dg Chest 2 View  06/12/2011  *RADIOLOGY REPORT*  Clinical Data: 78 year old male with cough and shortness of breath.  CHEST - 2 VIEW  Comparison: 02/12/2011 and prior chest radiographs dating back to 08/15/2006  Findings: The cardiomediastinal silhouette is unchanged. Chronic opacities/scarring at the lung bases are again noted. There is no evidence of focal airspace disease, pulmonary edema, suspicious pulmonary nodule/mass, pleural effusion, or pneumothorax. No acute bony abnormalities are identified.  IMPRESSION: No evidence of acute cardiopulmonary disease.  Chronic bibasilar pulmonary changes/scarring.  Original Report Authenticated By: Rosendo Gros, M.D.     Assessment/Plan: #1 COPD exacerbation currently on steroids antibiotics as well as oral bronchodilators. #2 hyperlipidemia stable  #3 essential  hypertension #4 BPH/boo   LOS: 2 days   Goran Olden A 06/14/2011, 7:26 AM

## 2011-06-14 NOTE — Progress Notes (Signed)
Clinical Social Worker completed the psychosocial assessment, which can be found in the shadow chart. FL-2 completed and placed in shadow chart for MD signature and skilled nursing facility search initiated. CSW will follow up with patient with bed offers.  Genelle Bal, MSW, LCSW [phone]

## 2011-06-14 NOTE — Progress Notes (Signed)
Met with pt re d/c plan, pt plan is for short term rehab. He currently lives alone and would need assistance with ADLs as well as PT/OT for strengthening. Will notify CSW of need for placement, as pt will most likely d/c in next 2 days. Johny Shock RN MPH Case Manager [phone]

## 2011-06-15 MED ORDER — ALBUTEROL SULFATE (5 MG/ML) 0.5% IN NEBU
2.5000 mg | INHALATION_SOLUTION | Freq: Four times a day (QID) | RESPIRATORY_TRACT | Status: DC
  Administered 2011-06-15 – 2011-06-18 (×13): 2.5 mg via RESPIRATORY_TRACT
  Filled 2011-06-15 (×13): qty 0.5

## 2011-06-15 NOTE — Progress Notes (Signed)
Subjective: Patient is on the side of bed and feeling better day by day. He does have significant dyspnea. His O2 sat is range 91-97% on oxygen. He is clearly not back to baseline and recognizes that he'll need to skilled nursing to bridge the gap.  Objective: Vital signs in last 24 hours: Temp:  [97.6 F (36.4 C)-98 F (36.7 C)] 97.6 F (36.4 C) (03/05 0525) Pulse Rate:  [67-80] 67  (03/05 0525) Resp:  [17-20] 18  (03/05 0525) BP: (105-164)/(50-70) 105/61 mmHg (03/05 0525) SpO2:  [90 %-98 %] 91 % (03/05 0738) Weight change:   CBG (last 3)  No results found for this basename: GLUCAP:3 in the last 72 hours  Intake/Output from previous day: 03/04 0701 - 03/05 0700 In: 840 [P.O.:840] Out: 400 [Urine:400]  Physical Exam: Patient is awake alert sitting and is out of bed wearing his oxygen. He utilizes no accessory muscles. No JVD or bruits. Lungs reveal coarse breath sounds at the left base, prolonged expiratory phase otherwise clear. Heart sounds are distant. No peripheral edema. Intact distal pulses. Abdomen is benign. Neurologic exam is normal.   Lab Results:  Basename 06/13/11 0643 06/12/11 1813 06/12/11 1200  NA 139 -- 138  K 4.0 -- 3.9  CL 103 -- 100  CO2 28 -- 28  GLUCOSE 117* -- 144*  BUN 27* -- 29*  CREATININE 0.77 0.84 --  CALCIUM 8.7 -- 9.3  MG -- -- --  PHOS -- -- --    Basename 06/13/11 0643  AST 14  ALT 11  ALKPHOS 49  BILITOT 0.3  PROT 5.9*  ALBUMIN 2.9*    Basename 06/13/11 0643 06/12/11 1813 06/12/11 1200  WBC 6.7 5.7 --  NEUTROABS -- -- 4.3  HGB 13.1 14.5 --  HCT 39.2 42.6 --  MCV 90.1 90.8 --  PLT 180 149* --   No results found for this basename: INR, PROTIME   No results found for this basename: CKTOTAL:3,CKMB:3,CKMBINDEX:3,TROPONINI:3 in the last 72 hours No results found for this basename: TSH,T4TOTAL,FREET3,T3FREE,THYROIDAB in the last 72 hours No results found for this basename:  VITAMINB12:2,FOLATE:2,FERRITIN:2,TIBC:2,IRON:2,RETICCTPCT:2 in the last 72 hours  Studies/Results: X-ray Chest Pa And Lateral   06/13/2011  *RADIOLOGY REPORT*  Clinical Data: Cough, pneumonia  CHEST - 2 VIEW  Comparison: Chest radiograph 06/12/2011  Findings: Normal mediastinum and cardiac silhouette.  There is opacity over the left cardiac border which is present on multiple comparison exams.  Lungs are hyperinflated.  No effusion or pneumothorax.  IMPRESSION:  1.  No acute findings or change from prior. 2.  Hyperinflated lungs with chronic basilar opacities.  Original Report Authenticated By: Genevive Bi, M.D.     Assessment/Plan: #1 COPD exacerbation on steroids and bronchodilators we'll continue the present course  #2 essential hypertension stable  #3 hyperlipidemia stable  #4 benign prostatic hypertrophy stable  Plan Will wean oxygen as able insert for skilled nursing.   LOS: 3 days   Derisha Funderburke A 06/15/2011, 7:48 AM

## 2011-06-15 NOTE — Progress Notes (Signed)
Pt ambulated a second time today in the hallway. His oxygen saturation dropped to 75 % on 4lnc. NAD. Returned to room to rest.

## 2011-06-15 NOTE — Progress Notes (Signed)
Ambulated pt in hallway on o2 @ 4l [place]. Pt's breathing became labored and his oxygen saturation dropped to 80%. When he ceases activity, returns to 90s. Pt. Denies distress when ambulating. Will continue to ambulate intermittently and closely monitor o2 sats.

## 2011-06-16 ENCOUNTER — Inpatient Hospital Stay (HOSPITAL_COMMUNITY): Payer: Medicare Other

## 2011-06-16 NOTE — Progress Notes (Signed)
Subjective: Patient remains the same with a moist cough but good by mouth intake. He ambulated for the first time yesterday 20 significant extent and clearly had a significant desaturation. Cough does remain moist but overall he has been some better. We currently await the availability for skilled nursing bed.  Objective: Vital signs in last 24 hours: Temp:  [97.6 F (36.4 C)-98.6 F (37 C)] 97.6 F (36.4 C) (03/06 0542) Pulse Rate:  [64-78] 64  (03/06 0542) Resp:  [18-20] 20  (03/06 0542) BP: (111-139)/(53-65) 139/62 mmHg (03/06 0542) SpO2:  [90 %-92 %] 92 % (03/06 0542) Weight change:   CBG (last 3)  No results found for this basename: GLUCAP:3 in the last 72 hours  Intake/Output from previous day: 03/05 0701 - 03/06 0700 In: 840 [P.O.:840] Out: -   Physical Exam: Patient is awake and alert sitting wearing oxygen no excess her muscle utilization. He is no JVD or bruits. Pulmonary same reveals prolonged expiratory phase with some rhonchi involving the left lung base and wheezing involving the right base both which are new. Cardiovascular exam reveals distant heart sounds. No peripheral edema. Abdomen is benign. Neurologically he is normal.   Lab Results: No results found for this basename: NA:2,K:2,CL:2,CO2:2,GLUCOSE:2,BUN:2,CREATININE:2,CALCIUM:2,MG:2,PHOS:2 in the last 72 hours No results found for this basename: AST:2,ALT:2,ALKPHOS:2,BILITOT:2,PROT:2,ALBUMIN:2 in the last 72 hours No results found for this basename: WBC:2,NEUTROABS:2,HGB:2,HCT:2,MCV:2,PLT:2 in the last 72 hours No results found for this basename: INR, PROTIME   No results found for this basename: CKTOTAL:3,CKMB:3,CKMBINDEX:3,TROPONINI:3 in the last 72 hours No results found for this basename: TSH,T4TOTAL,FREET3,T3FREE,THYROIDAB in the last 72 hours No results found for this basename: VITAMINB12:2,FOLATE:2,FERRITIN:2,TIBC:2,IRON:2,RETICCTPCT:2 in the last 72 hours  Studies/Results: No results  found.   Assessment/Plan:  1 COPD exacerbation on steroids and bronchodilators we'll continue the present course  #2 essential hypertension stable  #3 hyperlipidemia stable  #4 benign prostatic hypertrophy stable    I will repeat a chest x-ray today to assure stability and that nothing further has developed. I did explain with to him that most when he needs at this time his rehabilitation, activity and continued pulmonary medications as ordered. I await notification of bed availability for skilled nursing.   LOS: 4 days   Emani Morad A 06/16/2011, 7:40 AM

## 2011-06-16 NOTE — Progress Notes (Signed)
UR completed. Allen Beasley 06/16/2011 [phone] 

## 2011-06-17 MED ORDER — ALBUTEROL SULFATE (5 MG/ML) 0.5% IN NEBU: 2.5000 mg | INHALATION_SOLUTION | Freq: Four times a day (QID) | RESPIRATORY_TRACT | Status: DC

## 2011-06-17 MED ORDER — ATORVASTATIN CALCIUM 40 MG PO TABS: 40.0000 mg | ORAL_TABLET | Freq: Every day | ORAL | Status: AC

## 2011-06-17 MED ORDER — MOXIFLOXACIN HCL 400 MG PO TABS: 400.0000 mg | ORAL_TABLET | Freq: Every day | ORAL | Status: AC

## 2011-06-17 MED ORDER — PREDNISONE 20 MG PO TABS: 20.0000 mg | ORAL_TABLET | Freq: Two times a day (BID) | ORAL | Status: AC

## 2011-06-17 MED ORDER — ALBUTEROL SULFATE (5 MG/ML) 0.5% IN NEBU: 2.5000 mg | INHALATION_SOLUTION | RESPIRATORY_TRACT | Status: DC | PRN

## 2011-06-17 MED ORDER — GUAIFENESIN ER 600 MG PO TB12: 600.0000 mg | ORAL_TABLET | Freq: Two times a day (BID) | ORAL | Status: AC

## 2011-06-17 NOTE — Discharge Summary (Signed)
DISCHARGE SUMMARY  Allen Beasley  MR#: [id number]  DOB:1923/05/26  Date of Admission: 06/12/2011 Date of Discharge: 06/17/2011  Attending Physician:Brigido Mera A  Patient's PCP:No primary provider on file.  Consults:  none  Discharge Diagnoses:  #1 COPD exacerbation secondary to bronchitis  #2 hyperlipidemia  #3 essential hypertension  #4 BPH/B00  Discharge Medications: Medication List  As of 06/17/2011  7:23 AM   STOP taking these medications         COMBIVENT IN      sildenafil 50 MG tablet      simvastatin 80 MG tablet         TAKE these medications         albuterol (5 MG/ML) 0.5% nebulizer solution   Commonly known as: PROVENTIL   Take 0.5 mLs (2.5 mg total) by nebulization every 2 (two) hours as needed for wheezing.      albuterol (5 MG/ML) 0.5% nebulizer solution   Commonly known as: PROVENTIL   Take 0.5 mLs (2.5 mg total) by nebulization 4 (four) times daily.      atorvastatin 40 MG tablet   Commonly known as: LIPITOR   Take 1 tablet (40 mg total) by mouth daily at 6 PM.      guaiFENesin 600 MG 12 hr tablet   Commonly known as: MUCINEX   Take 1 tablet (600 mg total) by mouth 2 (two) times daily.      losartan 50 MG tablet   Commonly known as: COZAAR   Take 50 mg by mouth daily.      moxifloxacin 400 MG tablet   Commonly known as: AVELOX   Take 1 tablet (400 mg total) by mouth daily at 6 PM.      predniSONE 20 MG tablet   Commonly known as: DELTASONE   Take 1 tablet (20 mg total) by mouth 2 (two) times daily with a meal.      terazosin 2 MG capsule   Commonly known as: HYTRIN   Take 2 mg by mouth at bedtime.      tiotropium 18 MCG inhalation capsule   Commonly known as: SPIRIVA   Place 18 mcg into inhaler and inhale daily.            Hospital Procedures: Dg Chest 2 View  06/16/2011  *RADIOLOGY REPORT*  Clinical Data: Hypoxemia.  Rule out infiltrate or congestive heart failure.  Cough and congestion.  CHEST - 2 VIEW  Comparison:  Chest x-ray 06/13/2011.  Findings: Lungs appear hyperexpanded with increased retrosternal air space and pruning of the pulmonary vasculature in the periphery, suggesting underlying COPD.  There are prominent interstitial opacities throughout the medial aspects of the lung bases bilaterally, only slightly increased compared to the recent prior study 06/13/2011.  Lungs otherwise appear clear.  No definite pleural effusions.  No evidence of edema.  Heart size is normal. Atherosclerotic calcifications are noted within the arch of the aorta.  IMPRESSION: 1. Slight increase seen in the medial bibasilar opacities, which may reflect underlying atelectasis, however, sequela of aspiration with developing pneumonitis, or infection, is not excluded. 2.  Atherosclerosis. 3.  Changes of COPD redemonstrated.  Original Report Authenticated By: Florencia Reasons, M.D.   X-ray Chest Pa And Lateral   06/13/2011  *RADIOLOGY REPORT*  Clinical Data: Cough, pneumonia  CHEST - 2 VIEW  Comparison: Chest radiograph 06/12/2011  Findings: Normal mediastinum and cardiac silhouette.  There is opacity over the left cardiac border which is present on multiple comparison exams.  Lungs  are hyperinflated.  No effusion or pneumothorax.  IMPRESSION:  1.  No acute findings or change from prior. 2.  Hyperinflated lungs with chronic basilar opacities.  Original Report Authenticated By: Genevive Bi, M.D.   Dg Chest 2 View  06/12/2011  *RADIOLOGY REPORT*  Clinical Data: 78 year old male with cough and shortness of breath.  CHEST - 2 VIEW  Comparison: 02/12/2011 and prior chest radiographs dating back to 08/15/2006  Findings: The cardiomediastinal silhouette is unchanged. Chronic opacities/scarring at the lung bases are again noted. There is no evidence of focal airspace disease, pulmonary edema, suspicious pulmonary nodule/mass, pleural effusion, or pneumothorax. No acute bony abnormalities are identified.  IMPRESSION: No evidence of acute  cardiopulmonary disease.  Chronic bibasilar pulmonary changes/scarring.  Original Report Authenticated By: Rosendo Gros, M.D.    History of Present Illness:  The patient is an 78 year old white male,  former smoker, who is normally followed by Dr. Jacky Kindle at [facility]. He indicates that he was down in [place] in the  [place] area visiting his brother and 2 weeks ago was treated for  pneumonia. He was given antibiotics as well as prednisone. He has  returned to [facility] and felt reasonably well until last night when he  became more short of breath. He indicated that he took his Spiriva this  morning, but did not try anything else and continued to have shortness  of breath, and thus came to the emergency room. When he was initially  seen, his sats were in the low 80s on room air. He was not able to be  weaned off of oxygen and had 1 set of nebulizer treatments. No steroids  were provided by the ER physician/resident, and I was asked to come,  admit the patient.  Upon seeing the patient, he did not look to be in any distress, but his  breathing was somewhat coarsened and medications were initiated as  below. He is currently by himself and does not have any family members  with him.  Hospital Course: Patient was admitted to the acute inpatient [facility] for management of his COPD exacerbation. The disease is long-standing and has not sound like he takes his inhalers on a regular basis. The decompensation was precipitated to his by his trip to [place] but he failed to recover to his baseline. He was treated aggressively with empiric antibiotics, bronchodilators and steroids to the extent that he is 80% better but is not back to his baseline. He does remain weak and deconditioned. His oxygen level does desaturate upon walking down to the low 80% range. I did discuss with him and we are keeping in mind that we're not clear what his baseline oxygen saturation  is prior to this decompensation as it is not been monitored on a regular basis. Regardless he does live at home and will need skilled nursing for continued pulmonary toilet, PT and OT to regain independent living. His by mouth intake has been excellent. Mentally he has been bright alert spirits upbeat and is well motivated dyspneic therapies. He is discharged to skilled nursing today  Day of Discharge Exam BP 116/65  Pulse 88  Temp(Src) 98.5 F (36.9 C) (Oral)  Resp 21  Ht 6\' 2"  (1.88 m)  Wt 87.091 kg (192 lb)  BMI 24.65 kg/m2  SpO2 90%  Physical Exam: General appearance: alert, cooperative and appears older than stated age Eyes: no scleral icterus Throat: oropharynx moist without erythema Resp: clear to auscultation bilaterally, patient does  have a prolonged expiratory phase but no overt wheezing rales or rhonchi Cardio: regular rate and rhythm, S1, S2 normal, no murmur, click, rub or gallop Extremities: no clubbing, cyanosis or edema Abdominal exam is benign soft nontender good bowel sounds Neurologically he is a week but nonlateralizing higher cortical functioning is intact  Discharge Labs: Chest x-ray one day prior to discharge he'll some atelectasis no heart failure no frank infiltrate Discharge instructions: Discharge Orders    Future Orders Please Complete By Expires   Diet - low sodium heart healthy      Increase activity slowly         Disposition: Skilled nursing  Follow-up Appts: Follow-up with Dr. Jacky Kindle at [facility] in 2 weeks following discharge to skilled nursing in the interim he will be seen by the physician at the skilled nursing Center that he chooses.  Call for appointment.  Condition on Discharge: Stable, he does continue on oxygen at 2 L nasal cannula, p to prevent further exacerbations once his steroids have been weaned further. atient will need some weaning of his steroids as the receiving physician sees appropriate. He has not been  steroid dependent prior to this. He may also benefit from the addition of Daliresp   Tests Needing Follow-up: None  Signed: Lempi Edwin A 06/17/2011, 7:23 AM

## 2011-06-17 NOTE — Progress Notes (Signed)
Mr. Grantham will discharge on Friday to [facility]. Patient, family, and MD aware. Discharge paperwork forwarded to facility. Patient's son will complete admissions paperwork at [facility].  Genelle Bal, MSW, LCSW [phone]

## 2011-06-18 DIAGNOSIS — J9600 Acute respiratory failure, unspecified whether with hypoxia or hypercapnia: Secondary | ICD-10-CM | POA: Diagnosis not present

## 2011-06-18 DIAGNOSIS — N400 Enlarged prostate without lower urinary tract symptoms: Secondary | ICD-10-CM | POA: Diagnosis not present

## 2011-06-18 DIAGNOSIS — J441 Chronic obstructive pulmonary disease with (acute) exacerbation: Secondary | ICD-10-CM | POA: Diagnosis not present

## 2011-06-18 DIAGNOSIS — E559 Vitamin D deficiency, unspecified: Secondary | ICD-10-CM | POA: Diagnosis not present

## 2011-06-18 DIAGNOSIS — R279 Unspecified lack of coordination: Secondary | ICD-10-CM | POA: Diagnosis not present

## 2011-06-18 DIAGNOSIS — I10 Essential (primary) hypertension: Secondary | ICD-10-CM | POA: Diagnosis not present

## 2011-06-18 DIAGNOSIS — R269 Unspecified abnormalities of gait and mobility: Secondary | ICD-10-CM | POA: Diagnosis not present

## 2011-06-18 DIAGNOSIS — E785 Hyperlipidemia, unspecified: Secondary | ICD-10-CM | POA: Diagnosis not present

## 2011-06-18 DIAGNOSIS — I119 Hypertensive heart disease without heart failure: Secondary | ICD-10-CM | POA: Diagnosis not present

## 2011-06-18 DIAGNOSIS — Z5189 Encounter for other specified aftercare: Secondary | ICD-10-CM | POA: Diagnosis not present

## 2011-06-18 NOTE — Progress Notes (Signed)
Recent remains unchanged from the discharge summary dated 06/17/2011. No new medications and no new orders have been provided. Indication reconciliation after visit summary was completed and has not changed since yesterday. He is to go to skilled nursing today.

## 2011-06-18 NOTE — Progress Notes (Signed)
Pt IV d/c'd. Awaiting transport to SNF by ambulance. Skin intact. Vitals stable. Pt is ambulatory.

## 2011-06-23 DIAGNOSIS — J441 Chronic obstructive pulmonary disease with (acute) exacerbation: Secondary | ICD-10-CM | POA: Diagnosis not present

## 2011-06-23 DIAGNOSIS — I10 Essential (primary) hypertension: Secondary | ICD-10-CM | POA: Diagnosis not present

## 2011-06-23 DIAGNOSIS — E785 Hyperlipidemia, unspecified: Secondary | ICD-10-CM | POA: Diagnosis not present

## 2011-07-06 DIAGNOSIS — E785 Hyperlipidemia, unspecified: Secondary | ICD-10-CM | POA: Diagnosis not present

## 2011-07-06 DIAGNOSIS — J449 Chronic obstructive pulmonary disease, unspecified: Secondary | ICD-10-CM | POA: Diagnosis not present

## 2011-07-06 DIAGNOSIS — I10 Essential (primary) hypertension: Secondary | ICD-10-CM | POA: Diagnosis not present

## 2011-07-14 DIAGNOSIS — J449 Chronic obstructive pulmonary disease, unspecified: Secondary | ICD-10-CM | POA: Diagnosis not present

## 2011-07-14 DIAGNOSIS — I10 Essential (primary) hypertension: Secondary | ICD-10-CM | POA: Diagnosis not present

## 2011-07-17 DIAGNOSIS — E785 Hyperlipidemia, unspecified: Secondary | ICD-10-CM | POA: Diagnosis not present

## 2011-07-17 DIAGNOSIS — J449 Chronic obstructive pulmonary disease, unspecified: Secondary | ICD-10-CM | POA: Diagnosis not present

## 2011-07-17 DIAGNOSIS — I10 Essential (primary) hypertension: Secondary | ICD-10-CM | POA: Diagnosis not present

## 2011-08-12 DIAGNOSIS — R071 Chest pain on breathing: Secondary | ICD-10-CM | POA: Diagnosis not present

## 2011-08-12 DIAGNOSIS — S298XXA Other specified injuries of thorax, initial encounter: Secondary | ICD-10-CM | POA: Diagnosis not present

## 2011-08-12 DIAGNOSIS — I10 Essential (primary) hypertension: Secondary | ICD-10-CM | POA: Diagnosis not present

## 2011-08-12 DIAGNOSIS — J449 Chronic obstructive pulmonary disease, unspecified: Secondary | ICD-10-CM | POA: Diagnosis not present

## 2011-09-20 DIAGNOSIS — L821 Other seborrheic keratosis: Secondary | ICD-10-CM | POA: Diagnosis not present

## 2011-09-20 DIAGNOSIS — Z85828 Personal history of other malignant neoplasm of skin: Secondary | ICD-10-CM | POA: Diagnosis not present

## 2011-09-20 DIAGNOSIS — L570 Actinic keratosis: Secondary | ICD-10-CM | POA: Diagnosis not present

## 2011-09-27 DIAGNOSIS — E785 Hyperlipidemia, unspecified: Secondary | ICD-10-CM | POA: Diagnosis not present

## 2011-09-27 DIAGNOSIS — I10 Essential (primary) hypertension: Secondary | ICD-10-CM | POA: Diagnosis not present

## 2011-09-27 DIAGNOSIS — J449 Chronic obstructive pulmonary disease, unspecified: Secondary | ICD-10-CM | POA: Diagnosis not present

## 2011-11-12 DIAGNOSIS — N400 Enlarged prostate without lower urinary tract symptoms: Secondary | ICD-10-CM | POA: Diagnosis not present

## 2012-03-23 DIAGNOSIS — J449 Chronic obstructive pulmonary disease, unspecified: Secondary | ICD-10-CM | POA: Diagnosis not present

## 2012-03-23 DIAGNOSIS — I10 Essential (primary) hypertension: Secondary | ICD-10-CM | POA: Diagnosis not present

## 2012-03-23 DIAGNOSIS — E785 Hyperlipidemia, unspecified: Secondary | ICD-10-CM | POA: Diagnosis not present

## 2012-03-23 DIAGNOSIS — Z23 Encounter for immunization: Secondary | ICD-10-CM | POA: Diagnosis not present

## 2012-07-13 DIAGNOSIS — L821 Other seborrheic keratosis: Secondary | ICD-10-CM | POA: Diagnosis not present

## 2012-07-13 DIAGNOSIS — Z85828 Personal history of other malignant neoplasm of skin: Secondary | ICD-10-CM | POA: Diagnosis not present

## 2012-07-13 DIAGNOSIS — L570 Actinic keratosis: Secondary | ICD-10-CM | POA: Diagnosis not present

## 2012-07-13 DIAGNOSIS — L219 Seborrheic dermatitis, unspecified: Secondary | ICD-10-CM | POA: Diagnosis not present

## 2012-07-21 DIAGNOSIS — H35369 Drusen (degenerative) of macula, unspecified eye: Secondary | ICD-10-CM | POA: Diagnosis not present

## 2012-08-16 ENCOUNTER — Encounter: Payer: Self-pay | Admitting: Cardiology

## 2012-08-17 ENCOUNTER — Encounter: Payer: Self-pay | Admitting: Cardiology

## 2012-08-19 ENCOUNTER — Encounter (HOSPITAL_COMMUNITY): Payer: Self-pay | Admitting: Neurology

## 2012-08-19 ENCOUNTER — Inpatient Hospital Stay (HOSPITAL_COMMUNITY)
Admission: EM | Admit: 2012-08-19 | Discharge: 2012-08-21 | DRG: 194 | Disposition: A | Discharge status: Home Health | Payer: Medicare Other | Attending: Internal Medicine | Admitting: Internal Medicine

## 2012-08-19 ENCOUNTER — Emergency Department (HOSPITAL_COMMUNITY): Payer: Medicare Other

## 2012-08-19 DIAGNOSIS — J441 Chronic obstructive pulmonary disease with (acute) exacerbation: Secondary | ICD-10-CM | POA: Diagnosis present

## 2012-08-19 DIAGNOSIS — Z87891 Personal history of nicotine dependence: Secondary | ICD-10-CM

## 2012-08-19 DIAGNOSIS — Z96649 Presence of unspecified artificial hip joint: Secondary | ICD-10-CM

## 2012-08-19 DIAGNOSIS — J439 Emphysema, unspecified: Secondary | ICD-10-CM | POA: Diagnosis present

## 2012-08-19 DIAGNOSIS — J449 Chronic obstructive pulmonary disease, unspecified: Secondary | ICD-10-CM | POA: Diagnosis not present

## 2012-08-19 DIAGNOSIS — Z880 Allergy status to penicillin: Secondary | ICD-10-CM | POA: Diagnosis not present

## 2012-08-19 DIAGNOSIS — Z9849 Cataract extraction status, unspecified eye: Secondary | ICD-10-CM

## 2012-08-19 DIAGNOSIS — J189 Pneumonia, unspecified organism: Secondary | ICD-10-CM | POA: Diagnosis not present

## 2012-08-19 DIAGNOSIS — R0902 Hypoxemia: Secondary | ICD-10-CM | POA: Diagnosis present

## 2012-08-19 DIAGNOSIS — Z79899 Other long term (current) drug therapy: Secondary | ICD-10-CM

## 2012-08-19 DIAGNOSIS — R0602 Shortness of breath: Secondary | ICD-10-CM | POA: Diagnosis not present

## 2012-08-19 DIAGNOSIS — I10 Essential (primary) hypertension: Secondary | ICD-10-CM | POA: Diagnosis present

## 2012-08-19 DIAGNOSIS — E785 Hyperlipidemia, unspecified: Secondary | ICD-10-CM | POA: Diagnosis not present

## 2012-08-19 DIAGNOSIS — R079 Chest pain, unspecified: Secondary | ICD-10-CM | POA: Diagnosis not present

## 2012-08-19 LAB — POCT I-STAT 3, ART BLOOD GAS (G3+)
Bicarbonate: 24 meq/L (ref 20.0–24.0)
O2 Saturation: 82 %
Patient temperature: 98.6
TCO2: 25 mmol/L (ref 0–100)
pCO2 arterial: 35.2 mmHg (ref 35.0–45.0)
pH, Arterial: 7.442 (ref 7.350–7.450)
pO2, Arterial: 44 mmHg — ABNORMAL LOW (ref 80.0–100.0)

## 2012-08-19 LAB — URINALYSIS, ROUTINE W REFLEX MICROSCOPIC
Bilirubin Urine: NEGATIVE
Glucose, UA: NEGATIVE mg/dL
Hgb urine dipstick: NEGATIVE
Ketones, ur: 15 mg/dL — AB
Leukocytes, UA: NEGATIVE
Nitrite: NEGATIVE
Protein, ur: NEGATIVE mg/dL
Specific Gravity, Urine: 1.022 (ref 1.005–1.030)
Urobilinogen, UA: 0.2 mg/dL (ref 0.0–1.0)
pH: 5.5 (ref 5.0–8.0)

## 2012-08-19 LAB — CBC WITH DIFFERENTIAL/PLATELET
Basophils Absolute: 0 10*3/uL (ref 0.0–0.1)
Basophils Relative: 0 % (ref 0–1)
Eosinophils Absolute: 0.1 K/uL (ref 0.0–0.7)
Eosinophils Relative: 1 % (ref 0–5)
HCT: 45 % (ref 39.0–52.0)
Hemoglobin: 15.3 g/dL (ref 13.0–17.0)
Lymphocytes Relative: 8 % — ABNORMAL LOW (ref 12–46)
Lymphs Abs: 0.8 K/uL (ref 0.7–4.0)
MCH: 31.5 pg (ref 26.0–34.0)
MCHC: 34 g/dL (ref 30.0–36.0)
MCV: 92.6 fL (ref 78.0–100.0)
Monocytes Absolute: 0.4 10*3/uL (ref 0.1–1.0)
Monocytes Relative: 4 % (ref 3–12)
Neutro Abs: 9.5 10*3/uL — ABNORMAL HIGH (ref 1.7–7.7)
Neutrophils Relative %: 88 % — ABNORMAL HIGH (ref 43–77)
Platelets: 120 10*3/uL — ABNORMAL LOW (ref 150–400)
RBC: 4.86 MIL/uL (ref 4.22–5.81)
RDW: 13 % (ref 11.5–15.5)
WBC: 10.8 10*3/uL — ABNORMAL HIGH (ref 4.0–10.5)

## 2012-08-19 LAB — BASIC METABOLIC PANEL WITH GFR
BUN: 31 mg/dL — ABNORMAL HIGH (ref 6–23)
CO2: 25 meq/L (ref 19–32)
GFR calc non Af Amer: 79 mL/min — ABNORMAL LOW (ref >90)
Glucose, Bld: 167 mg/dL — ABNORMAL HIGH (ref 70–99)
Potassium: 4.1 meq/L (ref 3.5–5.1)

## 2012-08-19 LAB — BASIC METABOLIC PANEL
Calcium: 9.1 mg/dL (ref 8.4–10.5)
Chloride: 100 mEq/L (ref 96–112)
Creatinine, Ser: 0.79 mg/dL (ref 0.50–1.35)
GFR calc Af Amer: >90 mL/min (ref >90)
Sodium: 136 mEq/L (ref 135–145)

## 2012-08-19 LAB — CG4 I-STAT (LACTIC ACID): Lactic Acid, Venous: 1.84 mmol/L (ref 0.5–2.2)

## 2012-08-19 LAB — POCT I-STAT TROPONIN I: Troponin i, poc: 0 ng/mL (ref 0.00–0.08)

## 2012-08-19 MED ORDER — POLYETHYLENE GLYCOL 3350 17 G PO PACK
17.0000 g | PACK | Freq: Every day | ORAL | Status: DC | PRN
  Filled 2012-08-19: qty 1

## 2012-08-19 MED ORDER — MOMETASONE FURO-FORMOTEROL FUM 100-5 MCG/ACT IN AERO
2.0000 | INHALATION_SPRAY | Freq: Two times a day (BID) | RESPIRATORY_TRACT | Status: DC
  Administered 2012-08-19 – 2012-08-21 (×4): 2 via RESPIRATORY_TRACT
  Filled 2012-08-19: qty 8.8

## 2012-08-19 MED ORDER — ALBUTEROL SULFATE (5 MG/ML) 0.5% IN NEBU
5.0000 mg | INHALATION_SOLUTION | Freq: Once | RESPIRATORY_TRACT | Status: AC
  Administered 2012-08-19: 5 mg via RESPIRATORY_TRACT
  Filled 2012-08-19: qty 1

## 2012-08-19 MED ORDER — TERAZOSIN HCL 2 MG PO CAPS
2.0000 mg | ORAL_CAPSULE | Freq: Every day | ORAL | Status: DC
  Administered 2012-08-19 – 2012-08-20 (×2): 2 mg via ORAL
  Filled 2012-08-19 (×3): qty 1

## 2012-08-19 MED ORDER — IPRATROPIUM BROMIDE 0.02 % IN SOLN
0.5000 mg | Freq: Once | RESPIRATORY_TRACT | Status: AC
  Administered 2012-08-19: 0.5 mg via RESPIRATORY_TRACT
  Filled 2012-08-19: qty 2.5

## 2012-08-19 MED ORDER — METHYLPREDNISOLONE SODIUM SUCC 125 MG IJ SOLR
80.0000 mg | Freq: Three times a day (TID) | INTRAMUSCULAR | Status: DC
  Administered 2012-08-19 – 2012-08-20 (×4): 80 mg via INTRAVENOUS
  Filled 2012-08-19 (×7): qty 1.28

## 2012-08-19 MED ORDER — ALBUTEROL SULFATE (5 MG/ML) 0.5% IN NEBU: 2.5000 mg | INHALATION_SOLUTION | RESPIRATORY_TRACT | Status: DC | PRN

## 2012-08-19 MED ORDER — SODIUM CHLORIDE 0.9 % IV BOLUS (SEPSIS)
1000.0000 mL | Freq: Once | INTRAVENOUS | Status: AC
  Administered 2012-08-19: 1000 mL via INTRAVENOUS

## 2012-08-19 MED ORDER — SODIUM CHLORIDE 0.9 % IV SOLN
INTRAVENOUS | Status: DC
  Administered 2012-08-19: 18:00:00 via INTRAVENOUS

## 2012-08-19 MED ORDER — ENOXAPARIN SODIUM 40 MG/0.4ML ~~LOC~~ SOLN
40.0000 mg | SUBCUTANEOUS | Status: DC
  Administered 2012-08-19 – 2012-08-20 (×2): 40 mg via SUBCUTANEOUS
  Filled 2012-08-19 (×3): qty 0.4

## 2012-08-19 MED ORDER — SODIUM CHLORIDE 0.9 % IV SOLN
INTRAVENOUS | Status: AC
  Administered 2012-08-19: 15:00:00 via INTRAVENOUS

## 2012-08-19 MED ORDER — LOSARTAN POTASSIUM 50 MG PO TABS
50.0000 mg | ORAL_TABLET | Freq: Every day | ORAL | Status: DC
  Administered 2012-08-19 – 2012-08-21 (×3): 50 mg via ORAL
  Filled 2012-08-19 (×3): qty 1

## 2012-08-19 MED ORDER — ZOLPIDEM TARTRATE 5 MG PO TABS: 5.0000 mg | ORAL_TABLET | Freq: Every evening | ORAL | Status: DC | PRN

## 2012-08-19 MED ORDER — ACETAMINOPHEN 325 MG PO TABS
650.0000 mg | ORAL_TABLET | Freq: Four times a day (QID) | ORAL | Status: DC | PRN
  Administered 2012-08-19: 650 mg via ORAL
  Filled 2012-08-19: qty 2

## 2012-08-19 MED ORDER — LEVOFLOXACIN IN D5W 750 MG/150ML IV SOLN
750.0000 mg | Freq: Once | INTRAVENOUS | Status: AC
  Administered 2012-08-19: 750 mg via INTRAVENOUS
  Filled 2012-08-19: qty 150

## 2012-08-19 MED ORDER — LEVOFLOXACIN IN D5W 750 MG/150ML IV SOLN
750.0000 mg | INTRAVENOUS | Status: DC
  Administered 2012-08-19 – 2012-08-20 (×2): 750 mg via INTRAVENOUS
  Filled 2012-08-19 (×2): qty 150

## 2012-08-19 NOTE — H&P (Signed)
PCP:   No primary provider on file.   Chief Complaint:  sob  HPI: Well known to me with severe copd presenting with increased shortness of breath and new cough. Normal day yesterday, awoke feeling poorly.  Last hospitalized 2013--marginal oxygen then, refused home oxygen after a stay at rehab.  Upon presentation, increased work of breathing, hypoxemic--settling down now.  No sputum or blood. No chestpain, n/v. No lower extrem swelling. Admitted to iv abx.   Past Medical History: Past Medical History  Diagnosis Date  . Hypertension   . Hyperlipidemia   . COPD (chronic obstructive pulmonary disease)   . Erectile dysfunction   . History of chest pain   . History of aortic insufficiency    Past Surgical History  Procedure Laterality Date  . Cataract extraction, bilateral  2001  . Lithotripsy  2001  . Tonsilectomy, adenoidectomy, bilateral myringotomy and tubes  1932  . Total hip arthroplasty  07    right  . Joint replacement      Medications: Prior to Admission medications   Medication Sig Start Date End Date Taking? Authorizing Provider  albuterol (PROVENTIL) (2.5 MG/3ML) 0.083% nebulizer solution Take 2.5 mg by nebulization every 6 (six) hours as needed for wheezing.   Yes Historical Provider, MD  Fluticasone-Salmeterol (ADVAIR) 250-50 MCG/DOSE AEPB Inhale 1 puff into the lungs every 12 (twelve) hours.   Yes Historical Provider, MD  losartan (COZAAR) 50 MG tablet Take 50 mg by mouth daily.     Yes Historical Provider, MD  terazosin (HYTRIN) 2 MG capsule Take 2 mg by mouth at bedtime.     Yes Historical Provider, MD    Allergies:   Allergies  Allergen Reactions  . Penicillins Hives and Swelling    Social History:  reports that he quit smoking about 11 years ago. His smoking use included Cigarettes. He has a 59 pack-year smoking history. He does not have any smokeless tobacco history on file. He reports that  drinks alcohol. His drug history is not on file.  Family  History: Family History  Problem Relation Age of Onset  . Cancer Father   . Heart disease Father   . Heart attack Father     Physical Exam: Filed Vitals:   08/19/12 1300 08/19/12 1300 08/19/12 1453 08/19/12 1500  BP: 108/55 106/50 114/86   Pulse: 94 93 93   Temp:   98.1 F (36.7 C)   TempSrc:  Oral Oral   Resp: 36 24 23   Height:    6\' 2"  (1.88 m)  Weight:    89.54 kg (197 lb 6.4 oz)  SpO2: 95% 94% 93%    General appearance: alert, cooperative and no distress Head: Normocephalic, without obvious abnormality, atraumatic Eyes: conjunctivae/corneas clear. PERRL, EOM's intact.  Nose: Nares normal. Septum midline. Mucosa normal. No drainage or sinus tenderness. Throat: lips, mucosa, and tongue normal; teeth and gums normal Neck: no adenopathy, no carotid bruit, no JVD and thyroid not enlarged, symmetric, no tenderness/mass/nodules Resp: wheezes base - left, diminished bs left base, increased e/i Cardio: regular rate and rhythm, distant GI: soft, non-tender; bowel sounds normal; no masses,  no organomegaly Extremities: extremities normal, atraumatic, no cyanosis or edema Pulses: 2+ and symmetric Lymph nodes: Cervical adenopathy: no cervical lymphadenopathy Neurologic: Alert and oriented X 3, normal strength and tone. Normal symmetric reflexes.     Labs on Admission:   Recent Labs  08/19/12 1052  NA 136  K 4.1  CL 100  CO2 25  GLUCOSE 167*  BUN 31*  CREATININE 0.79  CALCIUM 9.1   No results found for this basename: AST, ALT, ALKPHOS, BILITOT, PROT, ALBUMIN,  in the last 72 hours No results found for this basename: LIPASE, AMYLASE,  in the last 72 hours  Recent Labs  08/19/12 1052  WBC 10.8*  NEUTROABS 9.5*  HGB 15.3  HCT 45.0  MCV 92.6  PLT 120*   No results found for this basename: CKTOTAL, CKMB, CKMBINDEX, TROPONINI,  in the last 72 hours No results found for this basename: TSH, T4TOTAL, FREET3, T3FREE, THYROIDAB,  in the last 72 hours No results found for  this basename: VITAMINB12, FOLATE, FERRITIN, TIBC, IRON, RETICCTPCT,  in the last 72 hours  Radiological Exams on Admission: Dg Chest Port 1 View  08/19/2012  *RADIOLOGY REPORT*  Clinical Data: Shortness of breath and history COPD.  PORTABLE CHEST - 1 VIEW  Comparison: 06/16/2011  Findings: There is a prominent new infiltrate of the left lung likely involving both the lingula and lower lobe.  There may also be a subtle new infiltrate at the right lung base.  No associated pulmonary edema or pleural fluid is identified.  Heart size is normal.  IMPRESSION: Acute pneumonia of the left lower lung.  Potential subtle infiltrate at the right lung base as well.   Original Report Authenticated By: [name], M.D.    Orders placed during the hospital encounter of 06/12/11  . ED EKG  . ED EKG  . EKG    Assessment/Plan 1. LLL pneumonia, community acquired 2. COPD exacernation 3. HTN  Admit abx, steroids, nebs   Ryo Klang A 08/19/2012, 6:03 PM

## 2012-08-19 NOTE — ED Notes (Signed)
Pt reporting sharp left sided rib pain when breathing, PA made aware.

## 2012-08-19 NOTE — ED Notes (Signed)
Per EMS- Pt woke up at 0600 this morning with SOB. Initially diminished, wheezing breathing sounds, oxygen saturation 80% RA. Given 5 mg albuterol, 0.5 atrovent, 125 solumedrol. ST 111, BP 122/64.

## 2012-08-19 NOTE — ED Notes (Signed)
After albuterol treatment, oxygenation remains 86% 4 L. PA reporting use NRB.

## 2012-08-19 NOTE — ED Provider Notes (Signed)
Medical screening examination/treatment/procedure(s) were conducted as a shared visit with non-physician practitioner(s) and myself.  I personally evaluated the patient during the encounter   Patient with one day of rapidly worsening shortness of breath, cough, chills that began late last night into this morning. Patient has known history of COPD, not usually on home oxygen. Patient denies any significant chest pain except for when he is coughing. EMS reports room air saturations of about 80%, was given albuterol Atrovent and Solu-Medrol en route. Patient was normotensive for them. Here the patient has a low-grade temperature 100.1, lactic acid is normal, no alteration in usual mental status, normotensive. After initial albuterol treatment, on nasal cannula at 5 L, saturations are 89-91%. patient reports he feels somewhat improved. Chest x-ray does endorse a community-acquired pneumonia which was my clinical suspicion. Patient's white count is mildly elevated. Given his allergy to penicillin, IV Levaquin is ordered, and do to continued hypoxemia, patient warrants admission. IV fluids, repeat nebs are also ordered. Patient remains comfortable and normotensive at this time. Plan is to discuss with [facility] for admission.  Impression: Community acquired pneumonia Hypoxemia COPD exacerbation   Allen Beasley. Advay Volante, MD 08/19/12 1257

## 2012-08-19 NOTE — ED Notes (Signed)
Attempted to collect urine but pt unable to void at this time

## 2012-08-19 NOTE — ED Provider Notes (Signed)
History     CSN: [id number]  Arrival date & time 08/19/12  1023   First MD Initiated Contact with Patient 08/19/12 1025      Chief Complaint  Patient presents with  . Shortness of Breath    (Consider location/radiation/quality/duration/timing/severity/associated sxs/prior treatment) HPI Comments: Patient is a 79 year old male who presents today with shortness of breath. He has a history of COPD. He states he was feeling well yesterday, working in the garden and cooking dinner.  Woke up this morning with shortness of breath. He took his breathing medications with no relief. He does not use oxygen at home. He has not been hospitalized recently. He has a tremor which he states has been present for the past 6 months. He denied nausea, vomiting, abdominal pain, numbness, weakness, headache. He has chest pain only when he coughs.   The history is provided by the patient. No language interpreter was used.    Past Medical History  Diagnosis Date  . Hypertension   . Hyperlipidemia   . COPD (chronic obstructive pulmonary disease)   . Erectile dysfunction   . History of chest pain   . History of aortic insufficiency     Past Surgical History  Procedure Laterality Date  . Cataract extraction, bilateral  2001  . Lithotripsy  2001  . Tonsilectomy, adenoidectomy, bilateral myringotomy and tubes  1932  . Total hip arthroplasty  07    right    Family History  Problem Relation Age of Onset  . Cancer Father   . Heart disease Father   . Heart attack Father     History  Substance Use Topics  . Smoking status: Former Smoker -- 1.00 packs/day for 59 years    Types: Cigarettes    Quit date: 08/04/2001  . Smokeless tobacco: Not on file  . Alcohol Use: Yes     Comment: patient admits to alcohol unknown amout      Review of Systems  Constitutional: Positive for fever.  Respiratory: Positive for cough and shortness of breath.   Cardiovascular: Positive for chest pain (associated with  cough). Negative for leg swelling.  Gastrointestinal: Negative for nausea, vomiting and abdominal pain.  All other systems reviewed and are negative.    Allergies  Penicillins  Home Medications   Current Outpatient Rx  Name  Route  Sig  Dispense  Refill  . EXPIRED: albuterol (PROVENTIL) (5 MG/ML) 0.5% nebulizer solution   Nebulization   Take 0.5 mLs (2.5 mg total) by nebulization every 2 (two) hours as needed for wheezing.   20 mL   5   . EXPIRED: albuterol (PROVENTIL) (5 MG/ML) 0.5% nebulizer solution   Nebulization   Take 0.5 mLs (2.5 mg total) by nebulization 4 (four) times daily.   20 mL   5   . losartan (COZAAR) 50 MG tablet   Oral   Take 50 mg by mouth daily.           Marland Kitchen terazosin (HYTRIN) 2 MG capsule   Oral   Take 2 mg by mouth at bedtime.           Marland Kitchen tiotropium (SPIRIVA) 18 MCG inhalation capsule   Inhalation   Place 18 mcg into inhaler and inhale daily.             There were no vitals taken for this visit.  Physical Exam  Nursing note and vitals reviewed. Constitutional: He is oriented to person, place, and time. He appears well-developed and well-nourished. No distress.  HENT:  Head: Normocephalic and atraumatic.  Right Ear: External ear normal.  Left Ear: External ear normal.  Nose: Nose normal.  Eyes: Conjunctivae are normal.  Neck: Normal range of motion. No tracheal deviation present.  Cardiovascular: Normal rate, regular rhythm and normal heart sounds.   Pulmonary/Chest: Effort normal. No stridor. He has decreased breath sounds in the right lower field. He has rales in the left middle field and the left lower field.  Abdominal: Soft. He exhibits no distension. There is no tenderness.  Musculoskeletal: Normal range of motion.  Neurological: He is alert and oriented to person, place, and time.  Skin: Skin is warm and dry. He is not diaphoretic.  Psychiatric: He has a normal mood and affect. His behavior is normal.    ED Course   Procedures (including critical care time)  Labs Reviewed  CBC WITH DIFFERENTIAL - Abnormal; Notable for the following:    WBC 10.8 (*)    Platelets 120 (*)    Neutrophils Relative 88 (*)    Neutro Abs 9.5 (*)    Lymphocytes Relative 8 (*)    All other components within normal limits  BASIC METABOLIC PANEL - Abnormal; Notable for the following:    Glucose, Bld 167 (*)    BUN 31 (*)    GFR calc non Af Amer 79 (*)    All other components within normal limits  CULTURE, BLOOD (ROUTINE X 2)  CULTURE, BLOOD (ROUTINE X 2)  URINE CULTURE  URINALYSIS, ROUTINE W REFLEX MICROSCOPIC  CG4 I-STAT (LACTIC ACID)  POCT I-STAT TROPONIN I   Dg Chest Port 1 View  08/19/2012  *RADIOLOGY REPORT*  Clinical Data: Shortness of breath and history COPD.  PORTABLE CHEST - 1 VIEW  Comparison: 06/16/2011  Findings: There is a prominent new infiltrate of the left lung likely involving both the lingula and lower lobe.  There may also be a subtle new infiltrate at the right lung base.  No associated pulmonary edema or pleural fluid is identified.  Heart size is normal.  IMPRESSION: Acute pneumonia of the left lower lung.  Potential subtle infiltrate at the right lung base as well.   Original Report Authenticated By: [name], M.D.      1. CAP (community acquired pneumonia)   2. COPD (chronic obstructive pulmonary disease)       MDM  Patient is an 79 year old male with history of COPD who presents today with shortness of breath. No oxygen use at home. XR reveals an acute pneumonia of the left lower lung. Oxygen sats were 80% on arrival. EMS gave patient Atrovent and solumedrol en route. Albuterol treatment on arrival. Currently oxygen sats remain at 90% on 5L of oxygen via [place]. Levaquin started due to a penicillin allergy. Dr. Oletta Lamas evaluated this patient and agrees with plan. Plan is to consult [facility] for admission. Patient / Family / Caregiver informed of clinical course, understand medical  decision-making process, and agree with plan.  Medications  acetaminophen (TYLENOL) tablet 650 mg (650 mg Oral Given 08/19/12 1206)  sodium chloride 0.9 % bolus 1,000 mL (not administered)  0.9 %  sodium chloride infusion (not administered)  albuterol (PROVENTIL) (5 MG/ML) 0.5% nebulizer solution 5 mg (5 mg Nebulization Given 08/19/12 1040)  levofloxacin (LEVAQUIN) IVPB 750 mg (750 mg Intravenous New Bag/Given 08/19/12 1137)  albuterol (PROVENTIL) (5 MG/ML) 0.5% nebulizer solution 5 mg (5 mg Nebulization Given 08/19/12 1249)  ipratropium (ATROVENT) nebulizer solution 0.5 mg (0.5 mg Nebulization Given 08/19/12 1249)  Mora Bellman, PA-C 08/19/12 1328

## 2012-08-20 DIAGNOSIS — I10 Essential (primary) hypertension: Secondary | ICD-10-CM | POA: Diagnosis not present

## 2012-08-20 DIAGNOSIS — J189 Pneumonia, unspecified organism: Secondary | ICD-10-CM | POA: Diagnosis not present

## 2012-08-20 DIAGNOSIS — E785 Hyperlipidemia, unspecified: Secondary | ICD-10-CM | POA: Diagnosis not present

## 2012-08-20 DIAGNOSIS — J449 Chronic obstructive pulmonary disease, unspecified: Secondary | ICD-10-CM | POA: Diagnosis not present

## 2012-08-20 LAB — URINE CULTURE
Colony Count: NO GROWTH
Culture: NO GROWTH

## 2012-08-20 NOTE — Progress Notes (Signed)
Subjective: Patient is doing well wearing his oxygen with good O2 sats. He is requiring 4 L to keep him and 91%. Minimal cough and sputum. Work of breathing has settled down but he is dyspneic with minimal exertion  Objective: Vital signs in last 24 hours: Temp:  [98 F (36.7 C)-100.1 F (37.8 C)] 98.1 F (36.7 C) (05/11 0526) Pulse Rate:  [58-114] 58 (05/11 0526) Resp:  [18-36] 18 (05/11 0526) BP: (106-135)/(35-86) 109/35 mmHg (05/11 0526) SpO2:  [86 %-95 %] 91 % (05/10 1956) Weight:  [88.8 kg (195 lb 12.3 oz)-89.54 kg (197 lb 6.4 oz)] 88.8 kg (195 lb 12.3 oz) (05/11 0526) Weight change:   CBG (last 3)  No results found for this basename: GLUCAP,  in the last 72 hours  Intake/Output from previous day: 05/10 0701 - 05/11 0700 In: 587 [P.O.:240; I.V.:195; IV Piggyback:152] Out: 1101 [Urine:1100; Stool:1]  Physical Exam: Looks good no distress wearing his oxygen good facial symmetry. No JVD or bruits. Lungs with diminished breath sounds at the left base minimal wheezing prolonged expiratory phase bilaterally. No JVD or bruits. No peripheral edema. Abdomen is soft and nontender. Heart sounds are distant but regular   Lab Results:  Recent Labs  08/19/12 1052  NA 136  K 4.1  CL 100  CO2 25  GLUCOSE 167*  BUN 31*  CREATININE 0.79  CALCIUM 9.1   No results found for this basename: AST, ALT, ALKPHOS, BILITOT, PROT, ALBUMIN,  in the last 72 hours  Recent Labs  08/19/12 1052  WBC 10.8*  NEUTROABS 9.5*  HGB 15.3  HCT 45.0  MCV 92.6  PLT 120*   No results found for this basename: INR, PROTIME   No results found for this basename: CKTOTAL, CKMB, CKMBINDEX, TROPONINI,  in the last 72 hours No results found for this basename: TSH, T4TOTAL, FREET3, T3FREE, THYROIDAB,  in the last 72 hours No results found for this basename: VITAMINB12, FOLATE, FERRITIN, TIBC, IRON, RETICCTPCT,  in the last 72 hours  Studies/Results: Dg Chest Port 1 View  08/19/2012  *RADIOLOGY REPORT*   Clinical Data: Shortness of breath and history COPD.  PORTABLE CHEST - 1 VIEW  Comparison: 06/16/2011  Findings: There is Beasley prominent new infiltrate of the left lung likely involving both the lingula and lower lobe.  There may also be Beasley subtle new infiltrate at the right lung base.  No associated pulmonary edema or pleural fluid is identified.  Heart size is normal.  IMPRESSION: Acute pneumonia of the left lower lung.  Potential subtle infiltrate at the right lung base as well.   Original Report Authenticated By: Allen Beasley, M.D.      Assessment/Plan:   #1 left lower lobe pneumonia community-acquired on Levaquin  #2 COPD exacerbation with hypoxemia will undoubtedly require home oxygen  #3 essential hypertension   LOS: 1 day   Allen Beasley 08/20/2012, 9:53 AM

## 2012-08-20 NOTE — Progress Notes (Signed)
Pt went for 2 additional walks w/ nursing staff

## 2012-08-20 NOTE — Progress Notes (Signed)
SATURATION QUALIFICATIONS: (This note is used to comply with regulatory documentation for home oxygen)  Patient Saturations on Room Air at Rest = 87%  Patient Saturations on Room Air while Ambulating = 84%  Patient Saturations on 3l Liters of oxygen while Ambulating = 87%  Patient Saturations on 5L Liters of oxygen while Ambulating = 91%  Please briefly explain why patient needs home oxygen: Patient Saturations on Room Air while Ambulating = 84%  Pt walk 350ft, with mild dyspnea, Pt Hr 90-115 when walking. Pt 93% 3L at rest

## 2012-08-21 DIAGNOSIS — I10 Essential (primary) hypertension: Secondary | ICD-10-CM | POA: Diagnosis not present

## 2012-08-21 DIAGNOSIS — J449 Chronic obstructive pulmonary disease, unspecified: Secondary | ICD-10-CM | POA: Diagnosis not present

## 2012-08-21 DIAGNOSIS — E785 Hyperlipidemia, unspecified: Secondary | ICD-10-CM | POA: Diagnosis not present

## 2012-08-21 DIAGNOSIS — J189 Pneumonia, unspecified organism: Secondary | ICD-10-CM | POA: Diagnosis not present

## 2012-08-21 MED ORDER — PREDNISONE 50 MG PO TABS
60.0000 mg | ORAL_TABLET | Freq: Every day | ORAL | Status: DC
  Administered 2012-08-21: 60 mg via ORAL
  Filled 2012-08-21 (×2): qty 1

## 2012-08-21 MED ORDER — LEVOFLOXACIN 750 MG PO TABS
750.0000 mg | ORAL_TABLET | Freq: Every day | ORAL | Status: DC
  Administered 2012-08-21: 750 mg via ORAL
  Filled 2012-08-21: qty 1

## 2012-08-21 MED ORDER — MOMETASONE FURO-FORMOTEROL FUM 100-5 MCG/ACT IN AERO: 2.0000 | INHALATION_SPRAY | Freq: Two times a day (BID) | RESPIRATORY_TRACT | Status: DC

## 2012-08-21 MED ORDER — PREDNISONE 20 MG PO TABS: 60.0000 mg | ORAL_TABLET | Freq: Every day | ORAL | Status: DC

## 2012-08-21 MED ORDER — LEVOFLOXACIN 750 MG PO TABS: 750.0000 mg | ORAL_TABLET | Freq: Every day | ORAL | Status: DC

## 2012-08-21 NOTE — Progress Notes (Signed)
Ambulated Pt 37ft O2 86% on RA, Pt less dyspnea then 08-20-12.

## 2012-08-21 NOTE — Progress Notes (Addendum)
Pt d/c home with son. Pt instructions and medications reviewed with Pt. Pt states understanding. All Pt questions answered. MD office to call to set up follow up appt

## 2012-08-21 NOTE — Progress Notes (Signed)
Utilization Review Completed.   Jullia Mulligan, RN, BSN Nurse Case Manager  [phone]  

## 2012-08-21 NOTE — Progress Notes (Signed)
08/21/12 1030 In to speak with pt. about home health services and dme.  Pt. chose [facility].  In addition, pt. will require home continuous oxygen and nebulizer machine.  TC to Hilda Lias, with [facility], to give referral for [facility] RN, PT, and TC to [facility], to give referral for home continuous oxygen and nebulizer.  Pt. to dc home today. Tera Mater, RN, BSN NCM [phone]

## 2012-08-21 NOTE — Discharge Summary (Signed)
DISCHARGE SUMMARY  Allen Beasley  MR#: [id number]  DOB:[birth year]  Date of Admission: 08/19/2012 Date of Discharge: 08/21/2012  Attending Physician:Treshaun Carrico A  Patient's PCP:No primary provider on file.  Consults:  none  Discharge Diagnoses: Active Problems:   CAP (community acquired pneumonia)   COPD   HYPERLIPIDEMIA   HTN (hypertension)   Discharge Medications:   Medication List    ASK your doctor about these medications       albuterol (2.5 MG/3ML) 0.083% nebulizer solution  Commonly known as:  PROVENTIL  Take 2.5 mg by nebulization every 6 (six) hours as needed for wheezing.     Fluticasone-Salmeterol 250-50 MCG/DOSE Aepb  Commonly known as:  ADVAIR  Inhale 1 puff into the lungs every 12 (twelve) hours.     losartan 50 MG tablet  Commonly known as:  COZAAR  Take 50 mg by mouth daily.     terazosin 2 MG capsule  Commonly known as:  HYTRIN  Take 2 mg by mouth at bedtime.        Hospital Procedures: Dg Chest Port 1 View  08/19/2012  *RADIOLOGY REPORT*  Clinical Data: Shortness of breath and history COPD.  PORTABLE CHEST - 1 VIEW  Comparison: 06/16/2011  Findings: There is a prominent new infiltrate of the left lung likely involving both the lingula and lower lobe.  There may also be a subtle new infiltrate at the right lung base.  No associated pulmonary edema or pleural fluid is identified.  Heart size is normal.  IMPRESSION: Acute pneumonia of the left lower lung.  Potential subtle infiltrate at the right lung base as well.   Original Report Authenticated By: [name], M.D.     History of Present Illness: Allen Beasley is a gentleman well-known to me with severe COPD and hypertension last hospitalized 2013 presenting with fairly sudden onset of cough, shortness of breath and air hunger. In the emergency room was found to have a new left lower lobe infiltrate and COPD exacerbation unresponsive to emergency room interventions. As a baseline he is  marginally compensated and in the past has required home oxygen which she is declined and has not been utilizing. He does remain fairly active and compliant with his inhalers. His last hospitalization 2013 required a rehabilitation stay and was fairly protracted. He is admitted this time for IV steroids and IV antibiotics.  Hospital Course: Patient was omitted placed IV Solu-Medrol and IV Levaquin. Bronchodilators were continued and he overall improved to the extent that on the morning of his discharge he is basically saying he is ready for discharge. He has ambulated yesterday in the hallway one time with oxygen 2 times without oxygen. He is eating well and speaking without any increasing dyspnea. We will transition him to orals this morning and hopefully discharge him later today.  Day of Discharge Exam BP 114/74  Pulse 68  Temp(Src) 97.2 F (36.2 C) (Oral)  Resp 20  Ht 6\' 2"  (1.88 m)  Wt 89.7 kg (197 lb 12 oz)  BMI 25.38 kg/m2  SpO2 95%  Physical Exam: General appearance: alert, cooperative and no distress Eyes: no scleral icterus Throat: oropharynx moist without erythema Resp: clear to auscultation bilaterally. Prolonged expiratory phase but back to baseline Cardio: regular rate and rhythm, S1, S2 normal, no murmur, click, rub or gallop Extremities: no clubbing, cyanosis or edema Neurologic exam is completely normal Abdomen is soft and nontender  Discharge Labs:  Recent Labs  08/19/12 1052  NA 136  K 4.1  CL 100  CO2 25  GLUCOSE 167*  BUN 31*  CREATININE 0.79  CALCIUM 9.1   No results found for this basename: AST, ALT, ALKPHOS, BILITOT, PROT, ALBUMIN,  in the last 72 hours  Recent Labs  08/19/12 1052  WBC 10.8*  NEUTROABS 9.5*  HGB 15.3  HCT 45.0  MCV 92.6  PLT 120*   No results found for this basename: CKTOTAL, CKMB, CKMBINDEX, TROPONINI,  in the last 72 hours No results found for this basename: TSH, T4TOTAL, FREET3, T3FREE, THYROIDAB,  in the last 72  hours No results found for this basename: VITAMINB12, FOLATE, FERRITIN, TIBC, IRON, RETICCTPCT,  in the last 72 hours  Discharge instructions:   Disposition: Home with home health  Follow-up Appts: Follow-up with Dr. Jacky Kindle at [facility] in 1 week.  Call for appointment.  Condition on Discharge: Improved and stable  Tests Needing Follow-up: None  Signed: Wilbern Pennypacker A 08/21/2012, 7:52 AM

## 2012-08-22 DIAGNOSIS — I359 Nonrheumatic aortic valve disorder, unspecified: Secondary | ICD-10-CM | POA: Diagnosis not present

## 2012-08-22 DIAGNOSIS — Z9981 Dependence on supplemental oxygen: Secondary | ICD-10-CM | POA: Diagnosis not present

## 2012-08-22 DIAGNOSIS — J441 Chronic obstructive pulmonary disease with (acute) exacerbation: Secondary | ICD-10-CM | POA: Diagnosis not present

## 2012-08-22 DIAGNOSIS — J189 Pneumonia, unspecified organism: Secondary | ICD-10-CM | POA: Diagnosis not present

## 2012-08-22 DIAGNOSIS — I10 Essential (primary) hypertension: Secondary | ICD-10-CM | POA: Diagnosis not present

## 2012-08-24 DIAGNOSIS — Z9981 Dependence on supplemental oxygen: Secondary | ICD-10-CM | POA: Diagnosis not present

## 2012-08-24 DIAGNOSIS — I359 Nonrheumatic aortic valve disorder, unspecified: Secondary | ICD-10-CM | POA: Diagnosis not present

## 2012-08-24 DIAGNOSIS — I10 Essential (primary) hypertension: Secondary | ICD-10-CM | POA: Diagnosis not present

## 2012-08-24 DIAGNOSIS — J441 Chronic obstructive pulmonary disease with (acute) exacerbation: Secondary | ICD-10-CM | POA: Diagnosis not present

## 2012-08-24 DIAGNOSIS — J189 Pneumonia, unspecified organism: Secondary | ICD-10-CM | POA: Diagnosis not present

## 2012-08-25 ENCOUNTER — Telehealth (HOSPITAL_COMMUNITY): Payer: Self-pay | Admitting: Emergency Medicine

## 2012-08-25 DIAGNOSIS — I359 Nonrheumatic aortic valve disorder, unspecified: Secondary | ICD-10-CM | POA: Diagnosis not present

## 2012-08-25 DIAGNOSIS — Z9981 Dependence on supplemental oxygen: Secondary | ICD-10-CM | POA: Diagnosis not present

## 2012-08-25 DIAGNOSIS — J189 Pneumonia, unspecified organism: Secondary | ICD-10-CM | POA: Diagnosis not present

## 2012-08-25 DIAGNOSIS — J441 Chronic obstructive pulmonary disease with (acute) exacerbation: Secondary | ICD-10-CM | POA: Diagnosis not present

## 2012-08-25 DIAGNOSIS — I10 Essential (primary) hypertension: Secondary | ICD-10-CM | POA: Diagnosis not present

## 2012-08-25 LAB — CULTURE, BLOOD (ROUTINE X 2): Culture: NO GROWTH

## 2012-08-25 NOTE — ED Notes (Signed)
Call from lab with [facility] B[facility].  Pt was admitted.  Provided them w/attending MD's name and office number

## 2012-08-26 LAB — CULTURE, BLOOD (ROUTINE X 2)

## 2012-08-29 DIAGNOSIS — I359 Nonrheumatic aortic valve disorder, unspecified: Secondary | ICD-10-CM | POA: Diagnosis not present

## 2012-08-29 DIAGNOSIS — I10 Essential (primary) hypertension: Secondary | ICD-10-CM | POA: Diagnosis not present

## 2012-08-29 DIAGNOSIS — J189 Pneumonia, unspecified organism: Secondary | ICD-10-CM | POA: Diagnosis not present

## 2012-08-29 DIAGNOSIS — J441 Chronic obstructive pulmonary disease with (acute) exacerbation: Secondary | ICD-10-CM | POA: Diagnosis not present

## 2012-08-29 DIAGNOSIS — Z9981 Dependence on supplemental oxygen: Secondary | ICD-10-CM | POA: Diagnosis not present

## 2012-08-31 DIAGNOSIS — I359 Nonrheumatic aortic valve disorder, unspecified: Secondary | ICD-10-CM | POA: Diagnosis not present

## 2012-08-31 DIAGNOSIS — Z9981 Dependence on supplemental oxygen: Secondary | ICD-10-CM | POA: Diagnosis not present

## 2012-08-31 DIAGNOSIS — J441 Chronic obstructive pulmonary disease with (acute) exacerbation: Secondary | ICD-10-CM | POA: Diagnosis not present

## 2012-08-31 DIAGNOSIS — I10 Essential (primary) hypertension: Secondary | ICD-10-CM | POA: Diagnosis not present

## 2012-08-31 DIAGNOSIS — J189 Pneumonia, unspecified organism: Secondary | ICD-10-CM | POA: Diagnosis not present

## 2012-09-05 DIAGNOSIS — I10 Essential (primary) hypertension: Secondary | ICD-10-CM | POA: Diagnosis not present

## 2012-09-05 DIAGNOSIS — I359 Nonrheumatic aortic valve disorder, unspecified: Secondary | ICD-10-CM | POA: Diagnosis not present

## 2012-09-05 DIAGNOSIS — Z9981 Dependence on supplemental oxygen: Secondary | ICD-10-CM | POA: Diagnosis not present

## 2012-09-05 DIAGNOSIS — J189 Pneumonia, unspecified organism: Secondary | ICD-10-CM | POA: Diagnosis not present

## 2012-09-05 DIAGNOSIS — J441 Chronic obstructive pulmonary disease with (acute) exacerbation: Secondary | ICD-10-CM | POA: Diagnosis not present

## 2012-09-07 DIAGNOSIS — J189 Pneumonia, unspecified organism: Secondary | ICD-10-CM | POA: Diagnosis not present

## 2012-09-07 DIAGNOSIS — J441 Chronic obstructive pulmonary disease with (acute) exacerbation: Secondary | ICD-10-CM | POA: Diagnosis not present

## 2012-09-07 DIAGNOSIS — I359 Nonrheumatic aortic valve disorder, unspecified: Secondary | ICD-10-CM | POA: Diagnosis not present

## 2012-09-07 DIAGNOSIS — I10 Essential (primary) hypertension: Secondary | ICD-10-CM | POA: Diagnosis not present

## 2012-09-07 DIAGNOSIS — Z9981 Dependence on supplemental oxygen: Secondary | ICD-10-CM | POA: Diagnosis not present

## 2012-09-19 DIAGNOSIS — Z6826 Body mass index (BMI) 26.0-26.9, adult: Secondary | ICD-10-CM | POA: Diagnosis not present

## 2012-09-19 DIAGNOSIS — J189 Pneumonia, unspecified organism: Secondary | ICD-10-CM | POA: Diagnosis not present

## 2012-09-19 DIAGNOSIS — J449 Chronic obstructive pulmonary disease, unspecified: Secondary | ICD-10-CM | POA: Diagnosis not present

## 2012-09-19 DIAGNOSIS — R0609 Other forms of dyspnea: Secondary | ICD-10-CM | POA: Diagnosis not present

## 2012-09-19 DIAGNOSIS — I10 Essential (primary) hypertension: Secondary | ICD-10-CM | POA: Diagnosis not present

## 2012-09-28 DIAGNOSIS — I10 Essential (primary) hypertension: Secondary | ICD-10-CM | POA: Diagnosis not present

## 2012-09-28 DIAGNOSIS — J189 Pneumonia, unspecified organism: Secondary | ICD-10-CM | POA: Diagnosis not present

## 2012-09-28 DIAGNOSIS — J441 Chronic obstructive pulmonary disease with (acute) exacerbation: Secondary | ICD-10-CM | POA: Diagnosis not present

## 2012-09-28 DIAGNOSIS — Z9981 Dependence on supplemental oxygen: Secondary | ICD-10-CM | POA: Diagnosis not present

## 2012-09-28 DIAGNOSIS — I359 Nonrheumatic aortic valve disorder, unspecified: Secondary | ICD-10-CM | POA: Diagnosis not present

## 2012-10-05 DIAGNOSIS — J449 Chronic obstructive pulmonary disease, unspecified: Secondary | ICD-10-CM | POA: Diagnosis not present

## 2012-10-05 DIAGNOSIS — E785 Hyperlipidemia, unspecified: Secondary | ICD-10-CM | POA: Diagnosis not present

## 2012-10-05 DIAGNOSIS — I10 Essential (primary) hypertension: Secondary | ICD-10-CM | POA: Diagnosis not present

## 2012-10-05 DIAGNOSIS — Z1331 Encounter for screening for depression: Secondary | ICD-10-CM | POA: Diagnosis not present

## 2012-10-16 DIAGNOSIS — R079 Chest pain, unspecified: Secondary | ICD-10-CM | POA: Diagnosis not present

## 2012-10-16 DIAGNOSIS — R05 Cough: Secondary | ICD-10-CM | POA: Diagnosis not present

## 2012-10-16 DIAGNOSIS — J189 Pneumonia, unspecified organism: Secondary | ICD-10-CM | POA: Diagnosis not present

## 2012-10-16 DIAGNOSIS — R0902 Hypoxemia: Secondary | ICD-10-CM | POA: Diagnosis not present

## 2012-10-27 DIAGNOSIS — J441 Chronic obstructive pulmonary disease with (acute) exacerbation: Secondary | ICD-10-CM | POA: Diagnosis not present

## 2012-10-27 DIAGNOSIS — Z6826 Body mass index (BMI) 26.0-26.9, adult: Secondary | ICD-10-CM | POA: Diagnosis not present

## 2012-10-27 DIAGNOSIS — J449 Chronic obstructive pulmonary disease, unspecified: Secondary | ICD-10-CM | POA: Diagnosis not present

## 2012-10-27 DIAGNOSIS — I10 Essential (primary) hypertension: Secondary | ICD-10-CM | POA: Diagnosis not present

## 2012-11-14 DIAGNOSIS — I10 Essential (primary) hypertension: Secondary | ICD-10-CM | POA: Diagnosis not present

## 2012-11-14 DIAGNOSIS — J441 Chronic obstructive pulmonary disease with (acute) exacerbation: Secondary | ICD-10-CM | POA: Diagnosis not present

## 2012-11-14 DIAGNOSIS — J189 Pneumonia, unspecified organism: Secondary | ICD-10-CM | POA: Diagnosis not present

## 2012-11-14 DIAGNOSIS — Z6825 Body mass index (BMI) 25.0-25.9, adult: Secondary | ICD-10-CM | POA: Diagnosis not present

## 2012-11-14 DIAGNOSIS — Z9981 Dependence on supplemental oxygen: Secondary | ICD-10-CM | POA: Diagnosis not present

## 2013-01-11 DIAGNOSIS — Z85828 Personal history of other malignant neoplasm of skin: Secondary | ICD-10-CM | POA: Diagnosis not present

## 2013-01-11 DIAGNOSIS — L821 Other seborrheic keratosis: Secondary | ICD-10-CM | POA: Diagnosis not present

## 2013-01-11 DIAGNOSIS — L570 Actinic keratosis: Secondary | ICD-10-CM | POA: Diagnosis not present

## 2013-01-29 DIAGNOSIS — E785 Hyperlipidemia, unspecified: Secondary | ICD-10-CM | POA: Diagnosis not present

## 2013-01-29 DIAGNOSIS — I10 Essential (primary) hypertension: Secondary | ICD-10-CM | POA: Diagnosis not present

## 2013-01-29 DIAGNOSIS — J449 Chronic obstructive pulmonary disease, unspecified: Secondary | ICD-10-CM | POA: Diagnosis not present

## 2013-01-29 DIAGNOSIS — Z6826 Body mass index (BMI) 26.0-26.9, adult: Secondary | ICD-10-CM | POA: Diagnosis not present

## 2013-04-14 DIAGNOSIS — R0609 Other forms of dyspnea: Secondary | ICD-10-CM | POA: Diagnosis not present

## 2013-04-14 DIAGNOSIS — R222 Localized swelling, mass and lump, trunk: Secondary | ICD-10-CM | POA: Diagnosis present

## 2013-04-14 DIAGNOSIS — Z79899 Other long term (current) drug therapy: Secondary | ICD-10-CM | POA: Diagnosis not present

## 2013-04-14 DIAGNOSIS — J90 Pleural effusion, not elsewhere classified: Secondary | ICD-10-CM | POA: Diagnosis not present

## 2013-04-14 DIAGNOSIS — IMO0002 Reserved for concepts with insufficient information to code with codable children: Secondary | ICD-10-CM | POA: Diagnosis not present

## 2013-04-14 DIAGNOSIS — J440 Chronic obstructive pulmonary disease with acute lower respiratory infection: Secondary | ICD-10-CM | POA: Diagnosis not present

## 2013-04-14 DIAGNOSIS — J441 Chronic obstructive pulmonary disease with (acute) exacerbation: Secondary | ICD-10-CM | POA: Diagnosis not present

## 2013-04-14 DIAGNOSIS — J189 Pneumonia, unspecified organism: Secondary | ICD-10-CM | POA: Diagnosis not present

## 2013-04-14 DIAGNOSIS — R918 Other nonspecific abnormal finding of lung field: Secondary | ICD-10-CM | POA: Diagnosis not present

## 2013-04-14 DIAGNOSIS — R0989 Other specified symptoms and signs involving the circulatory and respiratory systems: Secondary | ICD-10-CM | POA: Diagnosis not present

## 2013-04-14 DIAGNOSIS — Z87891 Personal history of nicotine dependence: Secondary | ICD-10-CM | POA: Diagnosis not present

## 2013-04-14 DIAGNOSIS — E7800 Pure hypercholesterolemia, unspecified: Secondary | ICD-10-CM | POA: Diagnosis not present

## 2013-04-14 DIAGNOSIS — J449 Chronic obstructive pulmonary disease, unspecified: Secondary | ICD-10-CM | POA: Diagnosis not present

## 2013-04-14 DIAGNOSIS — J984 Other disorders of lung: Secondary | ICD-10-CM | POA: Diagnosis not present

## 2013-04-14 DIAGNOSIS — R0602 Shortness of breath: Secondary | ICD-10-CM | POA: Diagnosis not present

## 2013-04-14 DIAGNOSIS — R0902 Hypoxemia: Secondary | ICD-10-CM | POA: Diagnosis not present

## 2013-04-14 DIAGNOSIS — I10 Essential (primary) hypertension: Secondary | ICD-10-CM | POA: Diagnosis not present

## 2013-04-14 DIAGNOSIS — J209 Acute bronchitis, unspecified: Secondary | ICD-10-CM | POA: Diagnosis not present

## 2013-04-21 DIAGNOSIS — R262 Difficulty in walking, not elsewhere classified: Secondary | ICD-10-CM | POA: Diagnosis not present

## 2013-04-21 DIAGNOSIS — R5381 Other malaise: Secondary | ICD-10-CM | POA: Diagnosis not present

## 2013-04-21 DIAGNOSIS — J189 Pneumonia, unspecified organism: Secondary | ICD-10-CM | POA: Diagnosis not present

## 2013-04-21 DIAGNOSIS — R0902 Hypoxemia: Secondary | ICD-10-CM | POA: Diagnosis not present

## 2013-04-21 DIAGNOSIS — E669 Obesity, unspecified: Secondary | ICD-10-CM | POA: Diagnosis not present

## 2013-04-21 DIAGNOSIS — E639 Nutritional deficiency, unspecified: Secondary | ICD-10-CM | POA: Diagnosis not present

## 2013-04-21 DIAGNOSIS — D72829 Elevated white blood cell count, unspecified: Secondary | ICD-10-CM | POA: Diagnosis not present

## 2013-04-21 DIAGNOSIS — R222 Localized swelling, mass and lump, trunk: Secondary | ICD-10-CM | POA: Diagnosis not present

## 2013-04-21 DIAGNOSIS — Z5189 Encounter for other specified aftercare: Secondary | ICD-10-CM | POA: Diagnosis not present

## 2013-04-21 DIAGNOSIS — R279 Unspecified lack of coordination: Secondary | ICD-10-CM | POA: Diagnosis not present

## 2013-04-21 DIAGNOSIS — E46 Unspecified protein-calorie malnutrition: Secondary | ICD-10-CM | POA: Diagnosis not present

## 2013-04-21 DIAGNOSIS — J209 Acute bronchitis, unspecified: Secondary | ICD-10-CM | POA: Diagnosis not present

## 2013-04-21 DIAGNOSIS — E785 Hyperlipidemia, unspecified: Secondary | ICD-10-CM | POA: Diagnosis not present

## 2013-04-21 DIAGNOSIS — J441 Chronic obstructive pulmonary disease with (acute) exacerbation: Secondary | ICD-10-CM | POA: Diagnosis not present

## 2013-04-21 DIAGNOSIS — R269 Unspecified abnormalities of gait and mobility: Secondary | ICD-10-CM | POA: Diagnosis not present

## 2013-04-21 DIAGNOSIS — R0602 Shortness of breath: Secondary | ICD-10-CM | POA: Diagnosis not present

## 2013-04-21 DIAGNOSIS — R4587 Impulsiveness: Secondary | ICD-10-CM | POA: Diagnosis not present

## 2013-04-21 DIAGNOSIS — F411 Generalized anxiety disorder: Secondary | ICD-10-CM | POA: Diagnosis not present

## 2013-04-21 DIAGNOSIS — I10 Essential (primary) hypertension: Secondary | ICD-10-CM | POA: Diagnosis not present

## 2013-04-21 DIAGNOSIS — J449 Chronic obstructive pulmonary disease, unspecified: Secondary | ICD-10-CM | POA: Diagnosis not present

## 2013-05-11 DIAGNOSIS — J441 Chronic obstructive pulmonary disease with (acute) exacerbation: Secondary | ICD-10-CM | POA: Diagnosis not present

## 2013-05-11 DIAGNOSIS — J449 Chronic obstructive pulmonary disease, unspecified: Secondary | ICD-10-CM | POA: Diagnosis not present

## 2013-05-11 DIAGNOSIS — Z6825 Body mass index (BMI) 25.0-25.9, adult: Secondary | ICD-10-CM | POA: Diagnosis not present

## 2013-05-11 DIAGNOSIS — J189 Pneumonia, unspecified organism: Secondary | ICD-10-CM | POA: Diagnosis not present

## 2013-05-11 DIAGNOSIS — I10 Essential (primary) hypertension: Secondary | ICD-10-CM | POA: Diagnosis not present

## 2013-05-14 ENCOUNTER — Ambulatory Visit (INDEPENDENT_AMBULATORY_CARE_PROVIDER_SITE_OTHER)
Admission: RE | Admit: 2013-05-14 | Discharge: 2013-05-14 | Disposition: A | Discharge status: Home / Self Care | Payer: Medicare Other | Source: Ambulatory Visit | Attending: Internal Medicine | Admitting: Internal Medicine

## 2013-05-14 ENCOUNTER — Ambulatory Visit (INDEPENDENT_AMBULATORY_CARE_PROVIDER_SITE_OTHER): Payer: Medicare Other | Admitting: Internal Medicine

## 2013-05-14 ENCOUNTER — Encounter: Payer: Self-pay | Admitting: Internal Medicine

## 2013-05-14 VITALS — BP 120/68 | HR 78 | Ht 74.0 in | Wt 189.4 lb

## 2013-05-14 DIAGNOSIS — R911 Solitary pulmonary nodule: Secondary | ICD-10-CM | POA: Diagnosis not present

## 2013-05-14 DIAGNOSIS — J449 Chronic obstructive pulmonary disease, unspecified: Secondary | ICD-10-CM

## 2013-05-14 DIAGNOSIS — J189 Pneumonia, unspecified organism: Secondary | ICD-10-CM | POA: Diagnosis not present

## 2013-05-14 DIAGNOSIS — J4489 Other specified chronic obstructive pulmonary disease: Secondary | ICD-10-CM

## 2013-05-14 MED ORDER — TIOTROPIUM BROMIDE MONOHYDRATE 18 MCG IN CAPS: 18.0000 ug | ORAL_CAPSULE | Freq: Every day | RESPIRATORY_TRACT | Status: DC

## 2013-05-14 NOTE — Progress Notes (Signed)
05/14/13- 34 yoM former smoker ( 36 pk yr)  Followed for COPD FOLLOWS FOR: recent hospital in [place] 04-14-13(double PNA); 9 days in hospital and then went to rehab. Last seen here 02-12-2011. Reportedly had abnormal CT needing f/u.  last for acute exacerbation of COPD with community-acquired pneumonia. Was visiting family and caught a respiratory infection from his son. He feels back now almost to normal and is trying to exercise but still more easily short of breath with exertion. Has oxygen 2 L/Advanced for sleep but has begun trying without it. Tapering of prednisone. Off antibiotics. Cough still intermittent clear or slightly yellow mucus with no blood and no chest pain. Had been hospitalized here in May 2014- CAP, COPD  ROS-see HPI Constitutional:   No-   weight loss, night sweats, fevers, chills, fatigue, lassitude. HEENT:   No-  headaches, difficulty swallowing, tooth/dental problems, sore throat,       No-  sneezing, itching, ear ache, nasal congestion, post nasal drip,  CV:  No-   chest pain, orthopnea, PND, swelling in lower extremities, anasarca,                                  dizziness, palpitations Resp: + shortness of breath with exertion or at rest.              +  productive cough,  No non-productive cough,  No- coughing up of blood.              No-   change in color of mucus.  No- wheezing.   Skin: No-   rash or lesions. GI:  No-   heartburn, indigestion, abdominal pain, nausea, vomiting,  GU:  MS:  No-   joint pain or swelling.   Neuro-     nothing unusual Psych:  No- change in mood or affect. No depression or anxiety.  No memory loss.  OBJ- Physical Exam General- Alert, Oriented, Affect-appropriate, Distress- none acute, elderly Skin- rash-none, lesions- none, excoriation- none Lymphadenopathy- none Head- atraumatic            Eyes- Gross vision intact, PERRLA, conjunctivae and secretions clear            Ears- Hearing, canals-normal            Nose- Clear, no-Septal dev,  mucus, polyps, erosion, perforation             Throat- Mallampati II , mucosa clear , drainage- none, tonsils- atrophic Neck- flexible , trachea midline, no stridor , thyroid nl, carotid no bruit Chest - symmetrical excursion , unlabored           Heart/CV- RRR , no murmur , no gallop  , no rub, nl s1 s2                           - JVD- none , edema- none, stasis changes- none, varices- none           Lung- + coarse breath sounds lower lobes, wheeze- none, cough+ rattling ,                          dullness-none, rub- none           Chest wall-  Abd-  Br/ Gen/ Rectal- Not done, not indicated Extrem- cyanosis- none, clubbing, none, atrophy- none, strength- nl Neuro- +  tremor R hand

## 2013-05-14 NOTE — Patient Instructions (Signed)
Order CXR- Dx COPD, pneumonia  Order CT chest future to do in March- no contrast, dx lung nodule  Refill script sent to [facility] for Spiriva   Please call as needed

## 2013-05-18 DIAGNOSIS — J189 Pneumonia, unspecified organism: Secondary | ICD-10-CM | POA: Diagnosis not present

## 2013-05-18 DIAGNOSIS — I10 Essential (primary) hypertension: Secondary | ICD-10-CM | POA: Diagnosis not present

## 2013-05-18 DIAGNOSIS — J441 Chronic obstructive pulmonary disease with (acute) exacerbation: Secondary | ICD-10-CM | POA: Diagnosis not present

## 2013-06-07 ENCOUNTER — Encounter: Payer: Self-pay | Admitting: Internal Medicine

## 2013-06-07 DIAGNOSIS — R911 Solitary pulmonary nodule: Secondary | ICD-10-CM | POA: Insufficient documentation

## 2013-06-07 NOTE — Assessment & Plan Note (Signed)
2 episodes of pneumonia in the past year. Both apparently precipitated by viral respiratory syndrome. He has had pneumococcal vaccine in the past after age 80. He will talk with his primary physician about Prevnar

## 2013-06-07 NOTE — Assessment & Plan Note (Signed)
Significant past smoking history. Now returning to baseline after exacerbation related to pneumonia.  Plan-refill Spiriva. Medication talk. Agree with exercise to improve stamina.

## 2013-06-15 DIAGNOSIS — Z9981 Dependence on supplemental oxygen: Secondary | ICD-10-CM | POA: Diagnosis not present

## 2013-06-15 DIAGNOSIS — I10 Essential (primary) hypertension: Secondary | ICD-10-CM | POA: Diagnosis not present

## 2013-06-15 DIAGNOSIS — Z6825 Body mass index (BMI) 25.0-25.9, adult: Secondary | ICD-10-CM | POA: Diagnosis not present

## 2013-06-15 DIAGNOSIS — J441 Chronic obstructive pulmonary disease with (acute) exacerbation: Secondary | ICD-10-CM | POA: Diagnosis not present

## 2013-06-15 DIAGNOSIS — E785 Hyperlipidemia, unspecified: Secondary | ICD-10-CM | POA: Diagnosis not present

## 2013-06-19 ENCOUNTER — Ambulatory Visit (INDEPENDENT_AMBULATORY_CARE_PROVIDER_SITE_OTHER)
Admission: RE | Admit: 2013-06-19 | Discharge: 2013-06-19 | Disposition: A | Discharge status: Home / Self Care | Payer: Medicare Other | Source: Ambulatory Visit | Attending: Internal Medicine | Admitting: Internal Medicine

## 2013-06-19 DIAGNOSIS — J189 Pneumonia, unspecified organism: Secondary | ICD-10-CM | POA: Diagnosis not present

## 2013-06-19 DIAGNOSIS — R911 Solitary pulmonary nodule: Secondary | ICD-10-CM | POA: Diagnosis not present

## 2013-06-19 DIAGNOSIS — J438 Other emphysema: Secondary | ICD-10-CM | POA: Diagnosis not present

## 2013-07-03 ENCOUNTER — Encounter: Payer: Self-pay | Admitting: Internal Medicine

## 2013-07-13 ENCOUNTER — Ambulatory Visit: Payer: Medicare Other | Admitting: Internal Medicine

## 2013-08-10 ENCOUNTER — Ambulatory Visit (INDEPENDENT_AMBULATORY_CARE_PROVIDER_SITE_OTHER): Payer: Medicare Other | Admitting: Internal Medicine

## 2013-08-10 ENCOUNTER — Encounter: Payer: Self-pay | Admitting: Internal Medicine

## 2013-08-10 VITALS — BP 122/68 | HR 68 | Ht 74.0 in | Wt 197.6 lb

## 2013-08-10 DIAGNOSIS — J449 Chronic obstructive pulmonary disease, unspecified: Secondary | ICD-10-CM

## 2013-08-10 DIAGNOSIS — E041 Nontoxic single thyroid nodule: Secondary | ICD-10-CM

## 2013-08-10 NOTE — Patient Instructions (Addendum)
We can continue present meds  Please call as needed  Primary physician to address thyroid nodule

## 2013-08-10 NOTE — Progress Notes (Signed)
05/14/13- 89 yoM former smoker ( 58 pk yr)  Followed for COPD FOLLOWS FOR: recent hospital in [place] 04-14-13(double PNA); 9 days in hospital and then went to rehab. Last seen here 02-12-2011. Reportedly had abnormal CT needing f/u.  last for acute exacerbation of COPD with community-acquired pneumonia. Was visiting family and caught a respiratory infection from his son. He feels back now almost to normal and is trying to exercise but still more easily short of breath with exertion. Has oxygen 2 L/Advanced for sleep but has begun trying without it. Tapering of prednisone. Off antibiotics. Cough still intermittent clear or slightly yellow mucus with no blood and no chest pain. Had been hospitalized here in May 2014- CAP, COPD  08/10/13- 89 yoM former smoker ( 62 pk yr)  Followed for COPD FOLLOWS FOR:  Still having sob with exertion.  Denies wheezing or tightness in chest Oxygen 2 L/Advanced for sleep He feels well controlled with no acute events. Discussed CT scan. CT chest 06/19/13 IMPRESSION:  Emphysematous changes without consolidation, mass, or pulmonary  nodule identified.  Left adrenal adenoma incidentally noted. This does not require  specific imaging followup.  Possible thyroid nodules. Nonemergent outpatient thyroid ultrasound  is recommended for further evaluation.  Electronically Signed  By: Conchita Paris M.D.  On: 06/19/2013 10:55   ROS-see HPI Constitutional:   No-   weight loss, night sweats, fevers, chills, fatigue, lassitude. HEENT:   No-  headaches, difficulty swallowing, tooth/dental problems, sore throat,       No-  sneezing, itching, ear ache, nasal congestion, post nasal drip,  CV:  No-   chest pain, orthopnea, PND, swelling in lower extremities, anasarca,                                  dizziness, palpitations Resp: + shortness of breath with exertion or at rest.              +  productive cough,  No non-productive cough,  No- coughing up of blood.              No-   change  in color of mucus.  No- wheezing.   Skin: No-   rash or lesions. GI:  No-   heartburn, indigestion, abdominal pain, nausea, vomiting,  GU:  MS:  No-   joint pain or swelling.   Neuro-     nothing unusual Psych:  No- change in mood or affect. No depression or anxiety.  No memory loss.  OBJ- Physical Exam General- Alert, Oriented, Affect-appropriate, Distress- none acute, elderly Skin- rash-none, lesions- none, excoriation- none Lymphadenopathy- none Head- atraumatic            Eyes- Gross vision intact, PERRLA, conjunctivae and secretions clear            Ears- Hearing, canals-normal            Nose- Clear, no-Septal dev, mucus, polyps, erosion, perforation             Throat- Mallampati II , mucosa clear , drainage- none, tonsils- atrophic Neck- flexible , trachea midline, no stridor , thyroid nl, carotid no bruit Chest - symmetrical excursion , unlabored           Heart/CV- RRR , no murmur , no gallop  , no rub, nl s1 s2                           -  JVD- none , edema- none, stasis changes- none, varices- none           Lung- distant, clear and unlabored, wheeze- none, cough-none,                                                     indullness-none, rub- none           Chest wall-  Abd-  Br/ Gen/ Rectal- Not done, not indicated Extrem- cyanosis- none, clubbing, none, atrophy- none, strength- nl Neuro- + tremor R hand

## 2013-09-15 DIAGNOSIS — E041 Nontoxic single thyroid nodule: Secondary | ICD-10-CM | POA: Insufficient documentation

## 2013-09-15 NOTE — Assessment & Plan Note (Signed)
Well controlled 

## 2013-09-15 NOTE — Assessment & Plan Note (Signed)
We'll defer to his primary physician, I don't treat this will

## 2013-09-20 DIAGNOSIS — D485 Neoplasm of uncertain behavior of skin: Secondary | ICD-10-CM | POA: Diagnosis not present

## 2013-09-20 DIAGNOSIS — Z85828 Personal history of other malignant neoplasm of skin: Secondary | ICD-10-CM | POA: Diagnosis not present

## 2013-09-20 DIAGNOSIS — C4442 Squamous cell carcinoma of skin of scalp and neck: Secondary | ICD-10-CM | POA: Diagnosis not present

## 2013-11-16 DIAGNOSIS — R82998 Other abnormal findings in urine: Secondary | ICD-10-CM | POA: Diagnosis not present

## 2013-11-16 DIAGNOSIS — I10 Essential (primary) hypertension: Secondary | ICD-10-CM | POA: Diagnosis not present

## 2013-11-16 DIAGNOSIS — Z125 Encounter for screening for malignant neoplasm of prostate: Secondary | ICD-10-CM | POA: Diagnosis not present

## 2013-11-16 DIAGNOSIS — E785 Hyperlipidemia, unspecified: Secondary | ICD-10-CM | POA: Diagnosis not present

## 2013-11-19 DIAGNOSIS — Z1331 Encounter for screening for depression: Secondary | ICD-10-CM | POA: Diagnosis not present

## 2013-11-19 DIAGNOSIS — Z125 Encounter for screening for malignant neoplasm of prostate: Secondary | ICD-10-CM | POA: Diagnosis not present

## 2013-11-19 DIAGNOSIS — Z6829 Body mass index (BMI) 29.0-29.9, adult: Secondary | ICD-10-CM | POA: Diagnosis not present

## 2013-11-19 DIAGNOSIS — I10 Essential (primary) hypertension: Secondary | ICD-10-CM | POA: Diagnosis not present

## 2013-11-19 DIAGNOSIS — E785 Hyperlipidemia, unspecified: Secondary | ICD-10-CM | POA: Diagnosis not present

## 2013-11-19 DIAGNOSIS — J441 Chronic obstructive pulmonary disease with (acute) exacerbation: Secondary | ICD-10-CM | POA: Diagnosis not present

## 2013-11-19 DIAGNOSIS — Z0000 Encounter for general adult medical examination without abnormal findings: Secondary | ICD-10-CM | POA: Diagnosis not present

## 2013-11-20 DIAGNOSIS — Z1212 Encounter for screening for malignant neoplasm of rectum: Secondary | ICD-10-CM | POA: Diagnosis not present

## 2013-11-26 DIAGNOSIS — R300 Dysuria: Secondary | ICD-10-CM | POA: Diagnosis not present

## 2013-11-26 DIAGNOSIS — R82998 Other abnormal findings in urine: Secondary | ICD-10-CM | POA: Diagnosis not present

## 2014-01-17 DIAGNOSIS — D170 Benign lipomatous neoplasm of skin and subcutaneous tissue of head, face and neck: Secondary | ICD-10-CM | POA: Diagnosis not present

## 2014-01-17 DIAGNOSIS — H35363 Drusen (degenerative) of macula, bilateral: Secondary | ICD-10-CM | POA: Diagnosis not present

## 2014-01-17 DIAGNOSIS — L723 Sebaceous cyst: Secondary | ICD-10-CM | POA: Diagnosis not present

## 2014-01-17 DIAGNOSIS — L570 Actinic keratosis: Secondary | ICD-10-CM | POA: Diagnosis not present

## 2014-01-17 DIAGNOSIS — L400 Psoriasis vulgaris: Secondary | ICD-10-CM | POA: Diagnosis not present

## 2014-01-17 DIAGNOSIS — Z85828 Personal history of other malignant neoplasm of skin: Secondary | ICD-10-CM | POA: Diagnosis not present

## 2014-01-17 DIAGNOSIS — L821 Other seborrheic keratosis: Secondary | ICD-10-CM | POA: Diagnosis not present

## 2014-02-11 ENCOUNTER — Encounter: Payer: Self-pay | Admitting: Internal Medicine

## 2014-02-11 ENCOUNTER — Ambulatory Visit (INDEPENDENT_AMBULATORY_CARE_PROVIDER_SITE_OTHER): Payer: Medicare Other | Admitting: Internal Medicine

## 2014-02-11 VITALS — BP 120/76 | HR 63 | Ht 74.0 in | Wt 207.0 lb

## 2014-02-11 DIAGNOSIS — J432 Centrilobular emphysema: Secondary | ICD-10-CM

## 2014-02-11 DIAGNOSIS — Z8679 Personal history of other diseases of the circulatory system: Secondary | ICD-10-CM | POA: Diagnosis not present

## 2014-02-11 NOTE — Progress Notes (Signed)
05/14/13- 84 yoM former smoker ( 61 pk yr)  Followed for COPD FOLLOWS FOR: recent hospital in [place] 04-14-13(double PNA); 9 days in hospital and then went to rehab. Last seen here 02-12-2011. Reportedly had abnormal CT needing f/u.  last for acute exacerbation of COPD with community-acquired pneumonia. Was visiting family and caught a respiratory infection from his son. He feels back now almost to normal and is trying to exercise but still more easily short of breath with exertion. Has oxygen 2 L/Advanced for sleep but has begun trying without it. Tapering of prednisone. Off antibiotics. Cough still intermittent clear or slightly yellow mucus with no blood and no chest pain. Had been hospitalized here in May 2014- CAP, COPD  08/10/13- 89 yoM former smoker ( 53 pk yr)  Followed for COPD FOLLOWS FOR:  Still having sob with exertion.  Denies wheezing or tightness in chest Oxygen 2 L/Advanced for sleep He feels well controlled with no acute events. Discussed CT scan. CT chest 06/19/13 IMPRESSION:  Emphysematous changes without consolidation, mass, or pulmonary  nodule identified.  Left adrenal adenoma incidentally noted. This does not require  specific imaging followup.  Possible thyroid nodules. Nonemergent outpatient thyroid ultrasound  is recommended for further evaluation.  Electronically Signed  By: Conchita Paris M.D.  On: 06/19/2013 10:55   02/11/14- 26 yoM former smoker ( 89 pk yr)  Followed for COPD Oxygen 2 L/Advanced for sleep FOLLOWS FOR: Denies any cough,congestion, wheezing or SOB at this time. He feels well without active respiratory compliant.  Still sleeps with oxygen. Drove himself twice to [place] and back.  Declines flu vax  ROS-see HPI Constitutional:   No-   weight loss, night sweats, fevers, chills, fatigue, lassitude. HEENT:   No-  headaches, difficulty swallowing, tooth/dental problems, sore throat,       No-  sneezing, itching, ear ache, nasal congestion, post nasal drip,   CV:  No-   chest pain, orthopnea, PND, swelling in lower extremities, anasarca,                                  dizziness, palpitations Resp: + shortness of breath with exertion or at rest.              +  productive cough,  No non-productive cough,  No- coughing up of blood.              No-   change in color of mucus.  No- wheezing.   Skin: No-   rash or lesions. GI:  No-   heartburn, indigestion, abdominal pain, nausea, vomiting,  GU:  MS:  No-   joint pain or swelling.   Neuro-     nothing unusual Psych:  No- change in mood or affect. No depression or anxiety.  No memory loss.  OBJ- Physical Exam General- Alert, Oriented, Affect-appropriate, Distress- none acute, elderly Skin- rash-none, lesions- none, excoriation- none Lymphadenopathy- none Head- atraumatic            Eyes- Gross vision intact, PERRLA, conjunctivae and secretions clear            Ears- Hearing, canals-normal            Nose- Clear, no-Septal dev, mucus, polyps, erosion, perforation             Throat- Mallampati II , mucosa clear , drainage- none, tonsils- atrophic Neck- flexible , trachea midline, no stridor , thyroid nl,  carotid no bruit Chest - symmetrical excursion , unlabored           Heart/CV- RRR , no murmur , no gallop  , no rub, nl s1 s2                           - JVD- none , edema- none, stasis changes- none, varices- none           Lung- distant, clear and unlabored, wheeze- none, cough-none,                                                     indullness-none, rub- none           Chest wall-  Abd-  Br/ Gen/ Rectal- Not done, not indicated Extrem- cyanosis- none, clubbing, none, atrophy- none, strength- nl Neuro- + tremor  hands

## 2014-02-11 NOTE — Assessment & Plan Note (Signed)
Don't hear murmur at this visit

## 2014-02-11 NOTE — Patient Instructions (Signed)
We can continue O2 L for sleep  Please call if we can help with anything

## 2014-02-11 NOTE — Assessment & Plan Note (Signed)
Doing quite well for his age. No current needs identified. Since he is on O2, we will continue to follow at intervals. Encouraged him to reconsider his choice not to take flu shot.

## 2014-04-02 ENCOUNTER — Other Ambulatory Visit: Payer: Self-pay | Admitting: Internal Medicine

## 2014-04-10 DIAGNOSIS — Z87891 Personal history of nicotine dependence: Secondary | ICD-10-CM | POA: Diagnosis not present

## 2014-04-10 DIAGNOSIS — J189 Pneumonia, unspecified organism: Secondary | ICD-10-CM | POA: Diagnosis not present

## 2014-04-10 DIAGNOSIS — E780 Pure hypercholesterolemia: Secondary | ICD-10-CM | POA: Diagnosis not present

## 2014-04-10 DIAGNOSIS — R062 Wheezing: Secondary | ICD-10-CM | POA: Diagnosis not present

## 2014-04-10 DIAGNOSIS — N400 Enlarged prostate without lower urinary tract symptoms: Secondary | ICD-10-CM | POA: Diagnosis present

## 2014-04-10 DIAGNOSIS — R918 Other nonspecific abnormal finding of lung field: Secondary | ICD-10-CM | POA: Diagnosis not present

## 2014-04-10 DIAGNOSIS — A419 Sepsis, unspecified organism: Secondary | ICD-10-CM | POA: Diagnosis not present

## 2014-04-10 DIAGNOSIS — R0602 Shortness of breath: Secondary | ICD-10-CM | POA: Diagnosis not present

## 2014-04-10 DIAGNOSIS — J440 Chronic obstructive pulmonary disease with acute lower respiratory infection: Secondary | ICD-10-CM | POA: Diagnosis not present

## 2014-04-10 DIAGNOSIS — M79661 Pain in right lower leg: Secondary | ICD-10-CM | POA: Diagnosis not present

## 2014-04-10 DIAGNOSIS — R0902 Hypoxemia: Secondary | ICD-10-CM | POA: Diagnosis not present

## 2014-04-10 DIAGNOSIS — M79662 Pain in left lower leg: Secondary | ICD-10-CM | POA: Diagnosis not present

## 2014-04-10 DIAGNOSIS — R749 Abnormal serum enzyme level, unspecified: Secondary | ICD-10-CM | POA: Diagnosis not present

## 2014-04-10 DIAGNOSIS — J9601 Acute respiratory failure with hypoxia: Secondary | ICD-10-CM | POA: Diagnosis not present

## 2014-04-10 DIAGNOSIS — R6510 Systemic inflammatory response syndrome (SIRS) of non-infectious origin without acute organ dysfunction: Secondary | ICD-10-CM | POA: Diagnosis not present

## 2014-04-10 DIAGNOSIS — J441 Chronic obstructive pulmonary disease with (acute) exacerbation: Secondary | ICD-10-CM | POA: Diagnosis not present

## 2014-04-10 DIAGNOSIS — Z96641 Presence of right artificial hip joint: Secondary | ICD-10-CM | POA: Diagnosis present

## 2014-04-10 DIAGNOSIS — Z6832 Body mass index (BMI) 32.0-32.9, adult: Secondary | ICD-10-CM | POA: Diagnosis not present

## 2014-04-10 DIAGNOSIS — Z880 Allergy status to penicillin: Secondary | ICD-10-CM | POA: Diagnosis not present

## 2014-04-10 DIAGNOSIS — G8929 Other chronic pain: Secondary | ICD-10-CM | POA: Diagnosis not present

## 2014-04-10 DIAGNOSIS — M7989 Other specified soft tissue disorders: Secondary | ICD-10-CM | POA: Diagnosis not present

## 2014-04-10 DIAGNOSIS — R079 Chest pain, unspecified: Secondary | ICD-10-CM | POA: Diagnosis not present

## 2014-04-10 DIAGNOSIS — R0600 Dyspnea, unspecified: Secondary | ICD-10-CM | POA: Diagnosis not present

## 2014-04-22 DIAGNOSIS — J189 Pneumonia, unspecified organism: Secondary | ICD-10-CM | POA: Diagnosis not present

## 2014-04-22 DIAGNOSIS — J449 Chronic obstructive pulmonary disease, unspecified: Secondary | ICD-10-CM | POA: Diagnosis not present

## 2014-04-22 DIAGNOSIS — I10 Essential (primary) hypertension: Secondary | ICD-10-CM | POA: Diagnosis not present

## 2014-04-22 DIAGNOSIS — Z6826 Body mass index (BMI) 26.0-26.9, adult: Secondary | ICD-10-CM | POA: Diagnosis not present

## 2014-04-22 DIAGNOSIS — E041 Nontoxic single thyroid nodule: Secondary | ICD-10-CM | POA: Diagnosis not present

## 2014-05-20 DIAGNOSIS — R112 Nausea with vomiting, unspecified: Secondary | ICD-10-CM | POA: Diagnosis not present

## 2014-05-20 DIAGNOSIS — R918 Other nonspecific abnormal finding of lung field: Secondary | ICD-10-CM | POA: Diagnosis not present

## 2014-05-20 DIAGNOSIS — M542 Cervicalgia: Secondary | ICD-10-CM | POA: Diagnosis not present

## 2014-05-20 DIAGNOSIS — J90 Pleural effusion, not elsewhere classified: Secondary | ICD-10-CM | POA: Diagnosis not present

## 2014-05-20 DIAGNOSIS — R109 Unspecified abdominal pain: Secondary | ICD-10-CM | POA: Diagnosis not present

## 2014-05-20 DIAGNOSIS — S270XXA Traumatic pneumothorax, initial encounter: Secondary | ICD-10-CM | POA: Diagnosis not present

## 2014-05-20 DIAGNOSIS — T07 Unspecified multiple injuries: Secondary | ICD-10-CM | POA: Diagnosis not present

## 2014-05-20 DIAGNOSIS — R51 Headache: Secondary | ICD-10-CM | POA: Diagnosis not present

## 2014-05-20 DIAGNOSIS — S2242XA Multiple fractures of ribs, left side, initial encounter for closed fracture: Secondary | ICD-10-CM | POA: Diagnosis not present

## 2014-05-21 DIAGNOSIS — R0989 Other specified symptoms and signs involving the circulatory and respiratory systems: Secondary | ICD-10-CM | POA: Diagnosis not present

## 2014-05-21 DIAGNOSIS — S29009A Unspecified injury of muscle and tendon of unspecified wall of thorax, initial encounter: Secondary | ICD-10-CM | POA: Diagnosis present

## 2014-05-21 DIAGNOSIS — R079 Chest pain, unspecified: Secondary | ICD-10-CM | POA: Diagnosis not present

## 2014-05-21 DIAGNOSIS — K922 Gastrointestinal hemorrhage, unspecified: Secondary | ICD-10-CM | POA: Diagnosis present

## 2014-05-21 DIAGNOSIS — R112 Nausea with vomiting, unspecified: Secondary | ICD-10-CM | POA: Diagnosis not present

## 2014-05-21 DIAGNOSIS — S299XXA Unspecified injury of thorax, initial encounter: Secondary | ICD-10-CM | POA: Diagnosis not present

## 2014-05-21 DIAGNOSIS — J9601 Acute respiratory failure with hypoxia: Secondary | ICD-10-CM | POA: Diagnosis present

## 2014-05-21 DIAGNOSIS — J690 Pneumonitis due to inhalation of food and vomit: Secondary | ICD-10-CM | POA: Diagnosis not present

## 2014-05-21 DIAGNOSIS — S2242XA Multiple fractures of ribs, left side, initial encounter for closed fracture: Secondary | ICD-10-CM | POA: Diagnosis present

## 2014-05-21 DIAGNOSIS — J90 Pleural effusion, not elsewhere classified: Secondary | ICD-10-CM | POA: Diagnosis not present

## 2014-05-21 DIAGNOSIS — J9383 Other pneumothorax: Secondary | ICD-10-CM | POA: Diagnosis not present

## 2014-05-21 DIAGNOSIS — R0902 Hypoxemia: Secondary | ICD-10-CM | POA: Diagnosis not present

## 2014-05-21 DIAGNOSIS — R918 Other nonspecific abnormal finding of lung field: Secondary | ICD-10-CM | POA: Diagnosis not present

## 2014-05-21 DIAGNOSIS — S298XXA Other specified injuries of thorax, initial encounter: Secondary | ICD-10-CM | POA: Diagnosis not present

## 2014-05-21 DIAGNOSIS — J449 Chronic obstructive pulmonary disease, unspecified: Secondary | ICD-10-CM | POA: Diagnosis present

## 2014-05-21 DIAGNOSIS — Z87891 Personal history of nicotine dependence: Secondary | ICD-10-CM | POA: Diagnosis not present

## 2014-05-21 DIAGNOSIS — J9811 Atelectasis: Secondary | ICD-10-CM | POA: Diagnosis not present

## 2014-05-21 DIAGNOSIS — J939 Pneumothorax, unspecified: Secondary | ICD-10-CM | POA: Diagnosis not present

## 2014-05-21 DIAGNOSIS — S270XXA Traumatic pneumothorax, initial encounter: Secondary | ICD-10-CM | POA: Diagnosis not present

## 2014-05-21 DIAGNOSIS — I10 Essential (primary) hypertension: Secondary | ICD-10-CM | POA: Diagnosis present

## 2014-05-21 DIAGNOSIS — J189 Pneumonia, unspecified organism: Secondary | ICD-10-CM | POA: Diagnosis not present

## 2014-06-17 DIAGNOSIS — Z1389 Encounter for screening for other disorder: Secondary | ICD-10-CM | POA: Diagnosis not present

## 2014-06-17 DIAGNOSIS — I10 Essential (primary) hypertension: Secondary | ICD-10-CM | POA: Diagnosis not present

## 2014-06-17 DIAGNOSIS — J449 Chronic obstructive pulmonary disease, unspecified: Secondary | ICD-10-CM | POA: Diagnosis not present

## 2014-06-17 DIAGNOSIS — Z6826 Body mass index (BMI) 26.0-26.9, adult: Secondary | ICD-10-CM | POA: Diagnosis not present

## 2014-06-17 DIAGNOSIS — E785 Hyperlipidemia, unspecified: Secondary | ICD-10-CM | POA: Diagnosis not present

## 2014-08-12 ENCOUNTER — Ambulatory Visit (INDEPENDENT_AMBULATORY_CARE_PROVIDER_SITE_OTHER): Payer: Medicare Other | Admitting: Internal Medicine

## 2014-08-12 ENCOUNTER — Encounter: Payer: Self-pay | Admitting: Internal Medicine

## 2014-08-12 VITALS — BP 120/78 | HR 76 | Ht 74.0 in | Wt 195.4 lb

## 2014-08-12 DIAGNOSIS — J432 Centrilobular emphysema: Secondary | ICD-10-CM

## 2014-08-12 DIAGNOSIS — J449 Chronic obstructive pulmonary disease, unspecified: Secondary | ICD-10-CM

## 2014-08-12 NOTE — Progress Notes (Signed)
05/14/13- 78 yoM former smoker ( 62 pk yr)  Followed for COPD FOLLOWS FOR: recent hospital in [place] 04-14-13(double PNA); 9 days in hospital and then went to rehab. Last seen here 02-12-2011. Reportedly had abnormal CT needing f/u.  last for acute exacerbation of COPD with community-acquired pneumonia. Was visiting family and caught a respiratory infection from his son. He feels back now almost to normal and is trying to exercise but still more easily short of breath with exertion. Has oxygen 2 L/Advanced for sleep but has begun trying without it. Tapering of prednisone. Off antibiotics. Cough still intermittent clear or slightly yellow mucus with no blood and no chest pain. Had been hospitalized here in May 2014- CAP, COPD  08/10/13- 89 yoM former smoker ( 25 pk yr)  Followed for COPD FOLLOWS FOR:  Still having sob with exertion.  Denies wheezing or tightness in chest Oxygen 2 L/Advanced for sleep He feels well controlled with no acute events. Discussed CT scan. CT chest 06/19/13 IMPRESSION:  Emphysematous changes without consolidation, mass, or pulmonary  nodule identified.  Left adrenal adenoma incidentally noted. This does not require  specific imaging followup.  Possible thyroid nodules. Nonemergent outpatient thyroid ultrasound  is recommended for further evaluation.  Electronically Signed  By: Conchita Paris M.D.  On: 06/19/2013 10:55   02/11/14- 20 yoM former smoker ( 63 pk yr)  Followed for COPD Oxygen 2 L/Advanced for sleep FOLLOWS FOR: Denies any cough,congestion, wheezing or SOB at this time. He feels well without active respiratory compliant.  Still sleeps with oxygen. Drove himself twice to [place] and back.  Declines flu vax  08/12/14- 89 yoM former smoker ( 84 pk yr)  Followed for COPD Oxygen 2 L/Advanced for sleep FOLLOWS FOR: Pt states he gets short winded with activity. While sitting he is fine. Pt tried to pace himself to help with this as well. Was in [place] checking  brother w alzheimer's. Tripped in pothole, broke L ribs and had L ptx w/o chest tube. F/U CXR by Dr Reynaldo Minium reported clear. Still easier DOE. Continues Spiriva. No longer using O2 at night, but still has it.  ROS-see HPI Constitutional:   No-   weight loss, night sweats, fevers, chills, fatigue, lassitude. HEENT:   No-  headaches, difficulty swallowing, tooth/dental problems, sore throat,       No-  sneezing, itching, ear ache, nasal congestion, post nasal drip,  CV:  No-   chest pain, orthopnea, PND, swelling in lower extremities, anasarca, dizziness, palpitations Resp: + shortness of breath with exertion or at rest.                productive cough,  No non-productive cough,  No- coughing up of blood.              No-   change in color of mucus.  No- wheezing.   Skin: No-   rash or lesions. GI:  No-   heartburn, indigestion, abdominal pain, nausea, vomiting,  GU:  MS:  No-   joint pain or swelling.   Neuro-     nothing unusual Psych:  No- change in mood or affect. No depression or anxiety.  No memory loss.  OBJ- Physical Exam General- Alert, Oriented, Affect-appropriate, Distress- none acute, elderly Skin- rash-none, lesions- none, excoriation- none Lymphadenopathy- none Head- atraumatic            Eyes- Gross vision intact, PERRLA, conjunctivae and secretions clear            Ears-  Hearing, canals-normal            Nose- Clear, no-Septal dev, mucus, polyps, erosion, perforation             Throat- Mallampati II , mucosa clear , drainage- none, tonsils- atrophic Neck- flexible , trachea midline, no stridor , thyroid nl, carotid no bruit Chest - symmetrical excursion , unlabored           Heart/CV- RRR , no murmur , no gallop  , no rub, nl s1 s2                           - JVD- none , edema- none, stasis changes- none, varices- none           Lung- distant, clear and unlabored, wheeze- none, cough+light,                                                     indullness-none, rub- none            Chest wall-  Abd-  Br/ Gen/ Rectal- Not done, not indicated Extrem- cyanosis- none, clubbing, none, atrophy- none, strength- nl Neuro- + tremor  hands

## 2014-08-12 NOTE — Patient Instructions (Signed)
Order- DME Advanced- [place] room air  Dx COPD with chronic bronchitis  Ok to continue Spiriva

## 2014-08-12 NOTE — Assessment & Plan Note (Signed)
He has COPD and had traumatic L pneumothorax this spring, now resolved. Since he is not using O2 now for sleep, I asked to recheck [place] room air

## 2014-10-17 ENCOUNTER — Ambulatory Visit: Payer: Medicare Other | Admitting: Internal Medicine

## 2014-12-06 DIAGNOSIS — R8290 Unspecified abnormal findings in urine: Secondary | ICD-10-CM | POA: Diagnosis not present

## 2014-12-06 DIAGNOSIS — Z125 Encounter for screening for malignant neoplasm of prostate: Secondary | ICD-10-CM | POA: Diagnosis not present

## 2014-12-06 DIAGNOSIS — E041 Nontoxic single thyroid nodule: Secondary | ICD-10-CM | POA: Diagnosis not present

## 2014-12-06 DIAGNOSIS — I10 Essential (primary) hypertension: Secondary | ICD-10-CM | POA: Diagnosis not present

## 2014-12-06 DIAGNOSIS — E785 Hyperlipidemia, unspecified: Secondary | ICD-10-CM | POA: Diagnosis not present

## 2014-12-23 DIAGNOSIS — Z6826 Body mass index (BMI) 26.0-26.9, adult: Secondary | ICD-10-CM | POA: Diagnosis not present

## 2014-12-23 DIAGNOSIS — Z0000 Encounter for general adult medical examination without abnormal findings: Secondary | ICD-10-CM | POA: Diagnosis not present

## 2014-12-23 DIAGNOSIS — I10 Essential (primary) hypertension: Secondary | ICD-10-CM | POA: Diagnosis not present

## 2014-12-23 DIAGNOSIS — E785 Hyperlipidemia, unspecified: Secondary | ICD-10-CM | POA: Diagnosis not present

## 2014-12-23 DIAGNOSIS — J449 Chronic obstructive pulmonary disease, unspecified: Secondary | ICD-10-CM | POA: Diagnosis not present

## 2015-01-20 DIAGNOSIS — Z85828 Personal history of other malignant neoplasm of skin: Secondary | ICD-10-CM | POA: Diagnosis not present

## 2015-01-20 DIAGNOSIS — L821 Other seborrheic keratosis: Secondary | ICD-10-CM | POA: Diagnosis not present

## 2015-01-20 DIAGNOSIS — L570 Actinic keratosis: Secondary | ICD-10-CM | POA: Diagnosis not present

## 2015-01-20 DIAGNOSIS — D170 Benign lipomatous neoplasm of skin and subcutaneous tissue of head, face and neck: Secondary | ICD-10-CM | POA: Diagnosis not present

## 2015-01-21 DIAGNOSIS — Z1212 Encounter for screening for malignant neoplasm of rectum: Secondary | ICD-10-CM | POA: Diagnosis not present

## 2015-02-10 ENCOUNTER — Encounter: Payer: Self-pay | Admitting: Internal Medicine

## 2015-02-19 NOTE — Patient Outreach (Signed)
[facility]) Care Management  02/19/2015  AKIVA BRASSFIELD [birth year] [id number]   Referral from [place] [facility], assigned Quinn Plowman, RN to outreach for [place] Management services.  Thanks, Ronnell Freshwater. [place], [place] Assistant Phone: [phone] Fax: [phone]

## 2015-03-11 ENCOUNTER — Other Ambulatory Visit: Payer: Self-pay

## 2015-03-11 NOTE — Patient Outreach (Signed)
[name] Care Management  03/11/2015  Allen Beasley 12/02/1923 [id number]   SUBJECTIVE:  Telephone call to patient regarding [facility] high risk referral.  Discussed and offered [facility] care management services to patient.  Patient declined services.  Patient refused [facility] care management outreach letter and brochure.   PLAN: RNCM will refer patient to [facility] to close due to refusal of services.  RNCM will notify patients primary MD of closure.   Quinn Plowman RN,BSN,CCM [place] Coordinator [phone]

## 2015-05-28 DIAGNOSIS — J441 Chronic obstructive pulmonary disease with (acute) exacerbation: Secondary | ICD-10-CM | POA: Diagnosis not present

## 2015-05-28 DIAGNOSIS — Z6826 Body mass index (BMI) 26.0-26.9, adult: Secondary | ICD-10-CM | POA: Diagnosis not present

## 2015-06-23 DIAGNOSIS — Z1389 Encounter for screening for other disorder: Secondary | ICD-10-CM | POA: Diagnosis not present

## 2015-06-23 DIAGNOSIS — G510 Bell's palsy: Secondary | ICD-10-CM | POA: Diagnosis not present

## 2015-06-23 DIAGNOSIS — Z9981 Dependence on supplemental oxygen: Secondary | ICD-10-CM | POA: Diagnosis not present

## 2015-06-23 DIAGNOSIS — B029 Zoster without complications: Secondary | ICD-10-CM | POA: Diagnosis not present

## 2015-06-23 DIAGNOSIS — E041 Nontoxic single thyroid nodule: Secondary | ICD-10-CM | POA: Diagnosis not present

## 2015-06-23 DIAGNOSIS — E784 Other hyperlipidemia: Secondary | ICD-10-CM | POA: Diagnosis not present

## 2015-06-23 DIAGNOSIS — I10 Essential (primary) hypertension: Secondary | ICD-10-CM | POA: Diagnosis not present

## 2015-06-23 DIAGNOSIS — J441 Chronic obstructive pulmonary disease with (acute) exacerbation: Secondary | ICD-10-CM | POA: Diagnosis not present

## 2015-06-23 DIAGNOSIS — Z6825 Body mass index (BMI) 25.0-25.9, adult: Secondary | ICD-10-CM | POA: Diagnosis not present

## 2015-06-23 DIAGNOSIS — D692 Other nonthrombocytopenic purpura: Secondary | ICD-10-CM | POA: Diagnosis not present

## 2016-01-16 DIAGNOSIS — I10 Essential (primary) hypertension: Secondary | ICD-10-CM | POA: Diagnosis not present

## 2016-01-16 DIAGNOSIS — E784 Other hyperlipidemia: Secondary | ICD-10-CM | POA: Diagnosis not present

## 2016-01-16 DIAGNOSIS — R8290 Unspecified abnormal findings in urine: Secondary | ICD-10-CM | POA: Diagnosis not present

## 2016-01-16 DIAGNOSIS — Z125 Encounter for screening for malignant neoplasm of prostate: Secondary | ICD-10-CM | POA: Diagnosis not present

## 2016-01-22 DIAGNOSIS — Z85828 Personal history of other malignant neoplasm of skin: Secondary | ICD-10-CM | POA: Diagnosis not present

## 2016-01-22 DIAGNOSIS — L723 Sebaceous cyst: Secondary | ICD-10-CM | POA: Diagnosis not present

## 2016-01-22 DIAGNOSIS — L821 Other seborrheic keratosis: Secondary | ICD-10-CM | POA: Diagnosis not present

## 2016-01-22 DIAGNOSIS — C44319 Basal cell carcinoma of skin of other parts of face: Secondary | ICD-10-CM | POA: Diagnosis not present

## 2016-01-22 DIAGNOSIS — D485 Neoplasm of uncertain behavior of skin: Secondary | ICD-10-CM | POA: Diagnosis not present

## 2016-01-22 DIAGNOSIS — L570 Actinic keratosis: Secondary | ICD-10-CM | POA: Diagnosis not present

## 2016-01-22 DIAGNOSIS — D170 Benign lipomatous neoplasm of skin and subcutaneous tissue of head, face and neck: Secondary | ICD-10-CM | POA: Diagnosis not present

## 2016-01-22 DIAGNOSIS — D1801 Hemangioma of skin and subcutaneous tissue: Secondary | ICD-10-CM | POA: Diagnosis not present

## 2016-02-03 DIAGNOSIS — L0213 Carbuncle of neck: Secondary | ICD-10-CM | POA: Diagnosis not present

## 2016-02-03 DIAGNOSIS — J449 Chronic obstructive pulmonary disease, unspecified: Secondary | ICD-10-CM | POA: Diagnosis not present

## 2016-02-03 DIAGNOSIS — Z9981 Dependence on supplemental oxygen: Secondary | ICD-10-CM | POA: Diagnosis not present

## 2016-02-03 DIAGNOSIS — E784 Other hyperlipidemia: Secondary | ICD-10-CM | POA: Diagnosis not present

## 2016-02-03 DIAGNOSIS — E041 Nontoxic single thyroid nodule: Secondary | ICD-10-CM | POA: Diagnosis not present

## 2016-02-03 DIAGNOSIS — Z6826 Body mass index (BMI) 26.0-26.9, adult: Secondary | ICD-10-CM | POA: Diagnosis not present

## 2016-02-03 DIAGNOSIS — Z1389 Encounter for screening for other disorder: Secondary | ICD-10-CM | POA: Diagnosis not present

## 2016-02-03 DIAGNOSIS — E663 Overweight: Secondary | ICD-10-CM | POA: Diagnosis not present

## 2016-02-03 DIAGNOSIS — D692 Other nonthrombocytopenic purpura: Secondary | ICD-10-CM | POA: Diagnosis not present

## 2016-02-03 DIAGNOSIS — J441 Chronic obstructive pulmonary disease with (acute) exacerbation: Secondary | ICD-10-CM | POA: Diagnosis not present

## 2016-02-03 DIAGNOSIS — I10 Essential (primary) hypertension: Secondary | ICD-10-CM | POA: Diagnosis not present

## 2016-02-03 DIAGNOSIS — Z0000 Encounter for general adult medical examination without abnormal findings: Secondary | ICD-10-CM | POA: Diagnosis not present

## 2016-02-04 ENCOUNTER — Emergency Department (HOSPITAL_COMMUNITY): Payer: Medicare Other

## 2016-02-04 ENCOUNTER — Inpatient Hospital Stay (HOSPITAL_COMMUNITY)
Admission: EM | Admit: 2016-02-04 | Discharge: 2016-02-17 | DRG: 871 | Disposition: A | Discharge status: Home Health | Payer: Medicare Other | Attending: Family Medicine | Admitting: Family Medicine

## 2016-02-04 ENCOUNTER — Encounter (HOSPITAL_COMMUNITY): Payer: Self-pay | Admitting: Emergency Medicine

## 2016-02-04 DIAGNOSIS — J432 Centrilobular emphysema: Secondary | ICD-10-CM | POA: Diagnosis present

## 2016-02-04 DIAGNOSIS — J90 Pleural effusion, not elsewhere classified: Secondary | ICD-10-CM | POA: Diagnosis present

## 2016-02-04 DIAGNOSIS — J9601 Acute respiratory failure with hypoxia: Secondary | ICD-10-CM | POA: Diagnosis present

## 2016-02-04 DIAGNOSIS — A419 Sepsis, unspecified organism: Secondary | ICD-10-CM | POA: Diagnosis present

## 2016-02-04 DIAGNOSIS — J9621 Acute and chronic respiratory failure with hypoxia: Secondary | ICD-10-CM

## 2016-02-04 DIAGNOSIS — B3781 Candidal esophagitis: Secondary | ICD-10-CM | POA: Diagnosis present

## 2016-02-04 DIAGNOSIS — Z96641 Presence of right artificial hip joint: Secondary | ICD-10-CM | POA: Diagnosis present

## 2016-02-04 DIAGNOSIS — R1312 Dysphagia, oropharyngeal phase: Secondary | ICD-10-CM | POA: Diagnosis present

## 2016-02-04 DIAGNOSIS — N400 Enlarged prostate without lower urinary tract symptoms: Secondary | ICD-10-CM | POA: Diagnosis present

## 2016-02-04 DIAGNOSIS — R935 Abnormal findings on diagnostic imaging of other abdominal regions, including retroperitoneum: Secondary | ICD-10-CM | POA: Diagnosis not present

## 2016-02-04 DIAGNOSIS — R069 Unspecified abnormalities of breathing: Secondary | ICD-10-CM | POA: Diagnosis not present

## 2016-02-04 DIAGNOSIS — J850 Gangrene and necrosis of lung: Secondary | ICD-10-CM

## 2016-02-04 DIAGNOSIS — J189 Pneumonia, unspecified organism: Secondary | ICD-10-CM

## 2016-02-04 DIAGNOSIS — Z09 Encounter for follow-up examination after completed treatment for conditions other than malignant neoplasm: Secondary | ICD-10-CM

## 2016-02-04 DIAGNOSIS — E785 Hyperlipidemia, unspecified: Secondary | ICD-10-CM | POA: Diagnosis present

## 2016-02-04 DIAGNOSIS — R1110 Vomiting, unspecified: Secondary | ICD-10-CM

## 2016-02-04 DIAGNOSIS — J690 Pneumonitis due to inhalation of food and vomit: Secondary | ICD-10-CM | POA: Diagnosis not present

## 2016-02-04 DIAGNOSIS — Z9981 Dependence on supplemental oxygen: Secondary | ICD-10-CM

## 2016-02-04 DIAGNOSIS — L0293 Carbuncle, unspecified: Secondary | ICD-10-CM | POA: Diagnosis not present

## 2016-02-04 DIAGNOSIS — I10 Essential (primary) hypertension: Secondary | ICD-10-CM | POA: Diagnosis present

## 2016-02-04 DIAGNOSIS — K219 Gastro-esophageal reflux disease without esophagitis: Secondary | ICD-10-CM

## 2016-02-04 DIAGNOSIS — Z7189 Other specified counseling: Secondary | ICD-10-CM

## 2016-02-04 DIAGNOSIS — R0902 Hypoxemia: Secondary | ICD-10-CM

## 2016-02-04 DIAGNOSIS — K224 Dyskinesia of esophagus: Secondary | ICD-10-CM | POA: Diagnosis present

## 2016-02-04 DIAGNOSIS — K5900 Constipation, unspecified: Secondary | ICD-10-CM | POA: Diagnosis present

## 2016-02-04 DIAGNOSIS — Z87891 Personal history of nicotine dependence: Secondary | ICD-10-CM

## 2016-02-04 DIAGNOSIS — Z8249 Family history of ischemic heart disease and other diseases of the circulatory system: Secondary | ICD-10-CM

## 2016-02-04 DIAGNOSIS — L02831 Carbuncle of head [any part, except face]: Secondary | ICD-10-CM | POA: Diagnosis present

## 2016-02-04 DIAGNOSIS — Z79899 Other long term (current) drug therapy: Secondary | ICD-10-CM

## 2016-02-04 DIAGNOSIS — K210 Gastro-esophageal reflux disease with esophagitis: Secondary | ICD-10-CM | POA: Diagnosis present

## 2016-02-04 DIAGNOSIS — Z515 Encounter for palliative care: Secondary | ICD-10-CM

## 2016-02-04 DIAGNOSIS — Z880 Allergy status to penicillin: Secondary | ICD-10-CM

## 2016-02-04 DIAGNOSIS — Z9119 Patient's noncompliance with other medical treatment and regimen: Secondary | ICD-10-CM

## 2016-02-04 DIAGNOSIS — R319 Hematuria, unspecified: Secondary | ICD-10-CM | POA: Diagnosis not present

## 2016-02-04 DIAGNOSIS — R739 Hyperglycemia, unspecified: Secondary | ICD-10-CM | POA: Diagnosis present

## 2016-02-04 DIAGNOSIS — I351 Nonrheumatic aortic (valve) insufficiency: Secondary | ICD-10-CM | POA: Diagnosis present

## 2016-02-04 LAB — CBC WITH DIFFERENTIAL/PLATELET
Basophils Absolute: 0 10*3/uL (ref 0.0–0.1)
Basophils Relative: 0 %
Eosinophils Absolute: 0.1 10*3/uL (ref 0.0–0.7)
Eosinophils Relative: 1 %
HCT: 46.1 % (ref 39.0–52.0)
Hemoglobin: 14.6 g/dL (ref 13.0–17.0)
Lymphocytes Relative: 15 %
Lymphs Abs: 2.1 10*3/uL (ref 0.7–4.0)
MCH: 29.1 pg (ref 26.0–34.0)
MCHC: 31.7 g/dL (ref 30.0–36.0)
MCV: 92 fL (ref 78.0–100.0)
Monocytes Absolute: 1.1 10*3/uL — ABNORMAL HIGH (ref 0.1–1.0)
Monocytes Relative: 8 %
Neutro Abs: 10.3 10*3/uL — ABNORMAL HIGH (ref 1.7–7.7)
Neutrophils Relative %: 76 %
Platelets: 222 10*3/uL (ref 150–400)
RBC: 5.01 MIL/uL (ref 4.22–5.81)
RDW: 13.4 % (ref 11.5–15.5)
WBC: 13.6 10*3/uL — ABNORMAL HIGH (ref 4.0–10.5)

## 2016-02-04 LAB — URINE MICROSCOPIC-ADD ON

## 2016-02-04 LAB — COMPREHENSIVE METABOLIC PANEL
ALT: 13 U/L — ABNORMAL LOW (ref 17–63)
AST: 23 U/L (ref 15–41)
Albumin: 4 g/dL (ref 3.5–5.0)
Alkaline Phosphatase: 63 U/L (ref 38–126)
Anion gap: 11 (ref 5–15)
BUN: 31 mg/dL — ABNORMAL HIGH (ref 6–20)
CO2: 27 mmol/L (ref 22–32)
Calcium: 9.1 mg/dL (ref 8.9–10.3)
Chloride: 101 mmol/L (ref 101–111)
Creatinine, Ser: 0.89 mg/dL (ref 0.61–1.24)
GFR calc Af Amer: >60 mL/min (ref >60)
GFR calc non Af Amer: >60 mL/min (ref >60)
Glucose, Bld: 196 mg/dL — ABNORMAL HIGH (ref 65–99)
Potassium: 4 mmol/L (ref 3.5–5.1)
Sodium: 139 mmol/L (ref 135–145)
Total Bilirubin: 0.8 mg/dL (ref 0.3–1.2)
Total Protein: 7.5 g/dL (ref 6.5–8.1)

## 2016-02-04 LAB — URINALYSIS, ROUTINE W REFLEX MICROSCOPIC
Bilirubin Urine: NEGATIVE
Glucose, UA: NEGATIVE mg/dL
Ketones, ur: NEGATIVE mg/dL
Nitrite: NEGATIVE
Protein, ur: 100 mg/dL — AB
Specific Gravity, Urine: 1.023 (ref 1.005–1.030)
pH: 6 (ref 5.0–8.0)

## 2016-02-04 LAB — I-STAT CG4 LACTIC ACID, ED: Lactic Acid, Venous: 3.22 mmol/L (ref 0.5–1.9)

## 2016-02-04 LAB — LIPASE, BLOOD: Lipase: 19 U/L (ref 11–51)

## 2016-02-04 LAB — LACTIC ACID, PLASMA: Lactic Acid, Venous: 2.7 mmol/L (ref 0.5–1.9)

## 2016-02-04 MED ORDER — LIDOCAINE-EPINEPHRINE (PF) 1 %-1:200000 IJ SOLN
10.0000 mL | Freq: Once | INTRAMUSCULAR | Status: AC
  Administered 2016-02-04: 10 mL
  Filled 2016-02-04: qty 30

## 2016-02-04 MED ORDER — METHYLPREDNISOLONE SODIUM SUCC 125 MG IJ SOLR
125.0000 mg | Freq: Once | INTRAMUSCULAR | Status: AC
  Administered 2016-02-04: 125 mg via INTRAVENOUS
  Filled 2016-02-04: qty 2

## 2016-02-04 MED ORDER — DEXTROSE 5 % IV SOLN
2.0000 g | Freq: Once | INTRAVENOUS | Status: AC
  Administered 2016-02-04: 2 g via INTRAVENOUS
  Filled 2016-02-04: qty 2

## 2016-02-04 MED ORDER — VANCOMYCIN HCL IN DEXTROSE 1-5 GM/200ML-% IV SOLN
1000.0000 mg | Freq: Once | INTRAVENOUS | Status: AC
  Administered 2016-02-05: 1000 mg via INTRAVENOUS
  Filled 2016-02-04: qty 200

## 2016-02-04 MED ORDER — LIDOCAINE-EPINEPHRINE 1 %-1:100000 IJ SOLN: 10.0000 mL | Freq: Once | INTRAMUSCULAR | Status: DC

## 2016-02-04 MED ORDER — LEVOFLOXACIN IN D5W 750 MG/150ML IV SOLN
750.0000 mg | Freq: Once | INTRAVENOUS | Status: AC
  Administered 2016-02-04: 750 mg via INTRAVENOUS
  Filled 2016-02-04: qty 150

## 2016-02-04 MED ORDER — IPRATROPIUM-ALBUTEROL 0.5-2.5 (3) MG/3ML IN SOLN
3.0000 mL | Freq: Once | RESPIRATORY_TRACT | Status: AC
  Administered 2016-02-04: 3 mL via RESPIRATORY_TRACT
  Filled 2016-02-04: qty 3

## 2016-02-04 NOTE — ED Notes (Signed)
6L [place] applied with SpO2 improvement to 89%

## 2016-02-04 NOTE — ED Notes (Signed)
Pt in CT.

## 2016-02-04 NOTE — ED Notes (Signed)
Bed: [id number] Expected date:  Expected time:  Means of arrival:  Comments: 82 yo M  Shortness of breath

## 2016-02-04 NOTE — ED Notes (Signed)
Respiratory at bedside.

## 2016-02-04 NOTE — Progress Notes (Signed)
Pt transported to and from CT on BIPAP without incident.  Pt tolerated well.  RT to monitor and assess as needed.

## 2016-02-04 NOTE — ED Notes (Signed)
MD Kohut at bedside. 

## 2016-02-04 NOTE — ED Notes (Signed)
Lactic Acid= 3.22, MD Kohut notified

## 2016-02-04 NOTE — ED Triage Notes (Addendum)
Pt c/o sob that began this AM; pt used inhaler this morning with relief; this evening inhaler did not provide any relief; pt had wheezing and was tripoding on EMS arrival; after administration of a Duoneb pt had significant improvement of wheezing; pt received total of 10mg  albuterol, 0.5mg  Atrovent, and 125mg  solumedrol from EMS; hx of COPD and Emphysema; denies chest pain  Abdomen noted to be distended and taut; abscess noted to back of neck

## 2016-02-04 NOTE — ED Provider Notes (Signed)
[facility] DEPT Provider Note   CSN: [id number] Arrival date & time: 02/04/16  2119  By signing my name below, I, Reola Mosher, attest that this documentation has been prepared under the direction and in the presence of Virgel Manifold, MD. Electronically Signed: Reola Mosher, ED Scribe. 02/04/16. 9:38 PM.  History   Chief Complaint Chief Complaint  Patient presents with  . Shortness of Breath   The history is provided by the patient and medical records. No language interpreter was used.    HPI Comments: Allen Beasley is a 82 y.o. male BIB EMS, with a PMHx of COPD and Emphysema, who presents to the Emergency Department complaining of constant, gradually worsening SOB which began several hours PTA. Pt reports associated abdominal distension and left-sided abdominal pain which began prior to the onset of his SOB and which he notes is now gradually improving. Pt tried using his at-home inhaler tonight and was additionally given 125mg  Solumedrol, 10mg  Albuterol, and a DuoNeb treatment via EMS with minimal relief of his current symptoms. Pt states that prior to the onset of his shortness of breath today that he was feeling at his baseline. Pt is not currently O2 dependent at home. Denies chest pain, diarrhea, constipation, nausea, vomiting, urgency, frequency, hematuria, dysuria, difficulty urinating, or any other associated symptoms.   Past Medical History:  Diagnosis Date  . COPD (chronic obstructive pulmonary disease)   . Erectile dysfunction   . History of aortic insufficiency   . History of chest pain   . Hyperlipidemia   . Hypertension    Patient Active Problem List   Diagnosis Date Noted  . Thyroid nodule 09/15/2013  . CAP (community acquired pneumonia) 08/19/2012  . HTN (hypertension) 08/05/2010  . Erectile dysfunction   . History of chest pain   . History of aortic insufficiency   . HYPERLIPIDEMIA 01/09/2010  . COPD with emphysema ([place]) 06/19/2007   Past  Surgical History:  Procedure Laterality Date  . CATARACT EXTRACTION, BILATERAL  2001  . JOINT REPLACEMENT    . LITHOTRIPSY  2001  . TONSILECTOMY, ADENOIDECTOMY, BILATERAL MYRINGOTOMY AND TUBES  1932  . TOTAL HIP ARTHROPLASTY  07   right    Home Medications    Prior to Admission medications   Medication Sig Start Date End Date Taking? Authorizing Provider  simvastatin (ZOCOR) 20 MG tablet Take 20 mg by mouth daily.    Historical Provider, MD  SPIRIVA HANDIHALER 18 MCG inhalation capsule PLACE 1 CAPSULE INTO INHALER AND INHALE THE CONTENTS OF 1 CAPSULE ONCE DAILY 04/02/14   Deneise Lever, MD  terazosin (HYTRIN) 2 MG capsule Take 2 mg by mouth at bedtime.      Historical Provider, MD   Family History Family History  Problem Relation Age of Onset  . Cancer Father   . Heart disease Father   . Heart attack Father    Social History Social History  Substance Use Topics  . Smoking status: Former Smoker    Packs/day: 1.00    Years: 59.00    Types: Cigarettes    Quit date: 08/04/2001  . Smokeless tobacco: Not on file  . Alcohol use Yes     Comment: patient admits to alcohol unknown amout   Allergies   Penicillins  Review of Systems Review of Systems  Respiratory: Positive for shortness of breath.   Cardiovascular: Negative for chest pain.  Gastrointestinal: Positive for abdominal distention and abdominal pain (left-sided). Negative for constipation, diarrhea, nausea and vomiting.  Genitourinary:  Negative for difficulty urinating, dysuria, frequency, hematuria and urgency.   Physical Exam Updated Vital Signs BP 194/77 (BP Location: Left Arm)   Pulse 114   Temp 100.9 F (38.3 C) (Oral)   Resp (!) 35   SpO2 (!) 78%   Physical Exam  Constitutional: He appears well-developed and well-nourished. He appears distressed.  HENT:  Head: Normocephalic.  Right Ear: External ear normal.  Left Ear: External ear normal.  Nose: Nose normal.  Eyes: Conjunctivae are normal. Right  eye exhibits no discharge. Left eye exhibits no discharge.  Neck: Normal range of motion.  Cardiovascular: Normal rate, regular rhythm and normal heart sounds.   No murmur heard. Pulmonary/Chest: Accessory muscle usage present. He is in respiratory distress. He has wheezes (bilaterally). He has no rales.  Pt speaking in short sentences on exam.   Abdominal: Soft. He exhibits distension. There is tenderness. There is no rebound and no guarding.  Distended with tenderness in epigastrium and left upper quadrant.   Musculoskeletal: Normal range of motion. He exhibits no edema or tenderness.  Neurological: He is alert. No cranial nerve deficit. Coordination normal.  Skin: Skin is warm and dry. No erythema. No pallor.  Abscess along the hairline posteriorly with a small amount of purulent drainage noted. Just inferior to this there is a firm, mobile subcutaneous mass consistent with a lipoma.   Psychiatric: He has a normal mood and affect. His behavior is normal.  Nursing note and vitals reviewed.  ED Treatments / Results  DIAGNOSTIC STUDIES: Oxygen Saturation is 91% on 6LO2NC, low by my interpretation.   COORDINATION OF CARE: 9:33 PM-Discussed next steps with pt. Pt verbalized understanding and is agreeable with the plan.   Labs (all labs ordered are listed, but only abnormal results are displayed) Labs Reviewed  LACTIC ACID, PLASMA - Abnormal; Notable for the following:       Result Value   Lactic Acid, Venous 2.7 (*)    All other components within normal limits  CBC WITH DIFFERENTIAL/PLATELET - Abnormal; Notable for the following:    WBC 13.6 (*)    Neutro Abs 10.3 (*)    Monocytes Absolute 1.1 (*)    All other components within normal limits  URINALYSIS, ROUTINE W REFLEX MICROSCOPIC (NOT AT [facility]) - Abnormal; Notable for the following:    Color, Urine AMBER (*)    APPearance CLOUDY (*)    Hgb urine dipstick LARGE (*)    Protein, ur 100 (*)    Leukocytes, UA TRACE (*)    All  other components within normal limits  COMPREHENSIVE METABOLIC PANEL - Abnormal; Notable for the following:    Glucose, Bld 196 (*)    BUN 31 (*)    ALT 13 (*)    All other components within normal limits  URINE MICROSCOPIC-ADD ON - Abnormal; Notable for the following:    Squamous Epithelial / LPF 0-5 (*)    Bacteria, UA RARE (*)    Casts HYALINE CASTS (*)    All other components within normal limits  I-STAT CG4 LACTIC ACID, ED - Abnormal; Notable for the following:    Lactic Acid, Venous 3.22 (*)    All other components within normal limits  CULTURE, BLOOD (ROUTINE X 2)  CULTURE, BLOOD (ROUTINE X 2)  URINE CULTURE  LIPASE, BLOOD  LACTIC ACID, PLASMA  I-STAT CG4 LACTIC ACID, ED    EKG  EKG Interpretation None      Radiology Ct Abdomen Pelvis Wo Contrast  Result Date: 02/05/2016  CLINICAL DATA:  Shortness of breath wheezing distended abdomen EXAM: CT ABDOMEN AND PELVIS WITHOUT CONTRAST TECHNIQUE: Multidetector CT imaging of the abdomen and pelvis was performed following the standard protocol without IV contrast. COMPARISON:  06/19/2013, radiograph 02/04/2016 FINDINGS: Lower chest: Small left-sided pleural effusion. Moderate emphysematous disease at the bilateral lung bases. Mild right middle lobe atelectasis. Patchy right lower lobe atelectasis. Partial consolidation in the posterior left lower lobe. Partial consolidation within the lingula. Coronary artery calcifications. Aortic valvular calcifications. Mitral valvular calcifications. Heart size is slightly enlarged. Hepatobiliary: No focal hepatic abnormalities. Mild increased density in the gallbladder lumen could reflect small amount of sludge or stones. No biliary dilatation. Pancreas: Pancreas is atrophic.  No inflammatory changes. Spleen: Normal in size without focal abnormality. Adrenals/Urinary Tract: Mild nodular thickening of the adrenal glands without discrete nodule. No hydronephrosis. Small stones are vascular  calcifications within the left kidney. The bladder demonstrates right posterior lobulated contour. Stomach/Bowel: No significant bowel dilatation. No obstruction. Scattered colon diverticula without acute inflammation. Vascular/Lymphatic: Multifocal ectasia of the abdominal aorta. Atherosclerotic vascular disease. No pathologically enlarged retroperitoneal or mesenteric lymph nodes. Reproductive: Prostate gland is enlarged with calcifications. Other: No free air or free fluid. Musculoskeletal: Status post right hip replacement with no acute dislocation. Multilevel degenerative changes of the spine with marked posterior facet changes. IMPRESSION: 1. No CT evidence for bowel obstruction. 2. Small left-sided pleural effusion. Moderate emphysematous disease at the bilateral lung bases. Partial consolidation in the left lower lobe and lingula, possible pneumonia. Continued imaging follow-up recommended to document resolution. 3. Mild cardiomegaly. 4. Atherosclerotic vascular disease of the aorta and branch vessels. Electronically Signed   By: Donavan Foil M.D.   On: 02/05/2016 00:31   Dg Chest Portable 1 View  Result Date: 02/04/2016 CLINICAL DATA:  82 year old male with fever and hypoxemia. EXAM: PORTABLE CHEST 1 VIEW COMPARISON:  Chest CT dated 06/19/2013 FINDINGS: There is emphysematous changes of the lungs. Small left pleural effusion with associated left lung base compressive atelectasis versus infiltrate. There bibasilar atelectatic changes/ scarring. No pneumothorax. There is mild cardiomegaly. The aorta is tortuous. There is atherosclerotic calcification of the thoracic aorta. Old healed left rib fracture. No acute fracture. IMPRESSION: Small left pleural effusion with associated left lung base atelectasis versus infiltrate. Clinical correlation and follow-up resolution recommended. Emphysema. Electronically Signed   By: Anner Crete M.D.   On: 02/04/2016 22:56    Procedures Procedures   INCISION  AND DRAINAGE Performed by: Virgel Manifold Consent: Verbal consent obtained. Risks and benefits: risks, benefits and alternatives were discussed Type: carbuncle  Body area: posterior scalp  Anesthesia: local infiltration  Draining from multiple areas. Two Incisions made with a scalpel.  Local anesthetic: lidocaine 1% w/ epinephrine  Anesthetic total: 3 ml  Complexity: complex Blunt dissection to break up loculations  Drainage: purulent  Drainage amount: large  Packing material: none  Patient tolerance: Patient tolerated the procedure well with no immediate complications.    Medications Ordered in ED Medications - No data to display  Initial Impression / Assessment and Plan / ED Course  I have reviewed the triage vital signs and the nursing notes.  Pertinent labs & imaging results that were available during my care of the patient were reviewed by me and considered in my medical decision making (see chart for details).  Clinical Course   89yM with respiratory distress. Acute on chronic respiratory failure. Hx of COPD with increased o2 requirement. CXR with possible pneumonia. Empiric abx were ordered. Carbuncle to posterior  scalp I&D'd but I doubt contributory to systemic symptoms. LUQ may actually be from LLL pneumonia? CT a/p otherwise ok.   Final Clinical Impressions(s) / ED Diagnoses   Final diagnoses:  Community acquired pneumonia, unspecified laterality  Carbuncle  Acute on chronic respiratory failure with hypoxia (HCC)   New Prescriptions New Prescriptions   No medications on file   I personally preformed the services scribed in my presence. The recorded information has been reviewed is accurate. Virgel Manifold, MD.    Virgel Manifold, MD 02/05/16 [phone]

## 2016-02-05 DIAGNOSIS — J449 Chronic obstructive pulmonary disease, unspecified: Secondary | ICD-10-CM | POA: Diagnosis not present

## 2016-02-05 DIAGNOSIS — J9601 Acute respiratory failure with hypoxia: Secondary | ICD-10-CM | POA: Diagnosis not present

## 2016-02-05 DIAGNOSIS — Z880 Allergy status to penicillin: Secondary | ICD-10-CM | POA: Diagnosis not present

## 2016-02-05 DIAGNOSIS — R739 Hyperglycemia, unspecified: Secondary | ICD-10-CM | POA: Diagnosis present

## 2016-02-05 DIAGNOSIS — J90 Pleural effusion, not elsewhere classified: Secondary | ICD-10-CM | POA: Diagnosis not present

## 2016-02-05 DIAGNOSIS — Z7189 Other specified counseling: Secondary | ICD-10-CM | POA: Diagnosis not present

## 2016-02-05 DIAGNOSIS — I10 Essential (primary) hypertension: Secondary | ICD-10-CM | POA: Diagnosis present

## 2016-02-05 DIAGNOSIS — J850 Gangrene and necrosis of lung: Secondary | ICD-10-CM | POA: Diagnosis not present

## 2016-02-05 DIAGNOSIS — R0602 Shortness of breath: Secondary | ICD-10-CM | POA: Diagnosis not present

## 2016-02-05 DIAGNOSIS — Z9981 Dependence on supplemental oxygen: Secondary | ICD-10-CM | POA: Diagnosis not present

## 2016-02-05 DIAGNOSIS — J690 Pneumonitis due to inhalation of food and vomit: Secondary | ICD-10-CM | POA: Diagnosis not present

## 2016-02-05 DIAGNOSIS — Z87891 Personal history of nicotine dependence: Secondary | ICD-10-CM | POA: Diagnosis not present

## 2016-02-05 DIAGNOSIS — E785 Hyperlipidemia, unspecified: Secondary | ICD-10-CM | POA: Diagnosis present

## 2016-02-05 DIAGNOSIS — J181 Lobar pneumonia, unspecified organism: Secondary | ICD-10-CM | POA: Diagnosis not present

## 2016-02-05 DIAGNOSIS — R935 Abnormal findings on diagnostic imaging of other abdominal regions, including retroperitoneum: Secondary | ICD-10-CM | POA: Diagnosis not present

## 2016-02-05 DIAGNOSIS — B3781 Candidal esophagitis: Secondary | ICD-10-CM | POA: Diagnosis not present

## 2016-02-05 DIAGNOSIS — Z96641 Presence of right artificial hip joint: Secondary | ICD-10-CM | POA: Diagnosis present

## 2016-02-05 DIAGNOSIS — R0600 Dyspnea, unspecified: Secondary | ICD-10-CM | POA: Diagnosis not present

## 2016-02-05 DIAGNOSIS — K210 Gastro-esophageal reflux disease with esophagitis: Secondary | ICD-10-CM | POA: Diagnosis not present

## 2016-02-05 DIAGNOSIS — R109 Unspecified abdominal pain: Secondary | ICD-10-CM | POA: Diagnosis not present

## 2016-02-05 DIAGNOSIS — R319 Hematuria, unspecified: Secondary | ICD-10-CM | POA: Diagnosis not present

## 2016-02-05 DIAGNOSIS — J432 Centrilobular emphysema: Secondary | ICD-10-CM | POA: Diagnosis not present

## 2016-02-05 DIAGNOSIS — R0902 Hypoxemia: Secondary | ICD-10-CM | POA: Diagnosis not present

## 2016-02-05 DIAGNOSIS — Z8249 Family history of ischemic heart disease and other diseases of the circulatory system: Secondary | ICD-10-CM | POA: Diagnosis not present

## 2016-02-05 DIAGNOSIS — N400 Enlarged prostate without lower urinary tract symptoms: Secondary | ICD-10-CM | POA: Diagnosis present

## 2016-02-05 DIAGNOSIS — J9621 Acute and chronic respiratory failure with hypoxia: Secondary | ICD-10-CM | POA: Diagnosis not present

## 2016-02-05 DIAGNOSIS — L02831 Carbuncle of head [any part, except face]: Secondary | ICD-10-CM | POA: Diagnosis not present

## 2016-02-05 DIAGNOSIS — A419 Sepsis, unspecified organism: Secondary | ICD-10-CM | POA: Diagnosis present

## 2016-02-05 DIAGNOSIS — Z515 Encounter for palliative care: Secondary | ICD-10-CM | POA: Diagnosis not present

## 2016-02-05 DIAGNOSIS — K219 Gastro-esophageal reflux disease without esophagitis: Secondary | ICD-10-CM | POA: Diagnosis not present

## 2016-02-05 DIAGNOSIS — R1312 Dysphagia, oropharyngeal phase: Secondary | ICD-10-CM | POA: Diagnosis not present

## 2016-02-05 DIAGNOSIS — K5900 Constipation, unspecified: Secondary | ICD-10-CM | POA: Diagnosis present

## 2016-02-05 DIAGNOSIS — Z1212 Encounter for screening for malignant neoplasm of rectum: Secondary | ICD-10-CM | POA: Diagnosis not present

## 2016-02-05 DIAGNOSIS — K224 Dyskinesia of esophagus: Secondary | ICD-10-CM | POA: Diagnosis not present

## 2016-02-05 DIAGNOSIS — N401 Enlarged prostate with lower urinary tract symptoms: Secondary | ICD-10-CM | POA: Diagnosis not present

## 2016-02-05 DIAGNOSIS — I351 Nonrheumatic aortic (valve) insufficiency: Secondary | ICD-10-CM | POA: Diagnosis not present

## 2016-02-05 DIAGNOSIS — Z79899 Other long term (current) drug therapy: Secondary | ICD-10-CM | POA: Diagnosis not present

## 2016-02-05 LAB — PROCALCITONIN: Procalcitonin: 3.14 ng/mL

## 2016-02-05 LAB — LACTIC ACID, PLASMA
Lactic Acid, Venous: 1.7 mmol/L (ref 0.5–1.9)
Lactic Acid, Venous: 1.7 mmol/L (ref 0.5–1.9)

## 2016-02-05 LAB — CBC
HCT: 41.2 % (ref 39.0–52.0)
HEMOGLOBIN: 13.1 g/dL (ref 13.0–17.0)
MCH: 28.9 pg (ref 26.0–34.0)
MCHC: 31.8 g/dL (ref 30.0–36.0)
MCV: 90.9 fL (ref 78.0–100.0)
PLATELETS: 203 10*3/uL (ref 150–400)
RBC: 4.53 MIL/uL (ref 4.22–5.81)
RDW: 13.3 % (ref 11.5–15.5)
WBC: 17.5 10*3/uL — ABNORMAL HIGH (ref 4.0–10.5)

## 2016-02-05 LAB — TROPONIN I

## 2016-02-05 LAB — STREP PNEUMONIAE URINARY ANTIGEN: STREP PNEUMO URINARY ANTIGEN: NEGATIVE

## 2016-02-05 LAB — MRSA PCR SCREENING: MRSA BY PCR: NEGATIVE

## 2016-02-05 MED ORDER — SODIUM CHLORIDE 0.9 % IV SOLN
INTRAVENOUS | Status: DC
  Administered 2016-02-05 (×2): via INTRAVENOUS

## 2016-02-05 MED ORDER — LEVOFLOXACIN IN D5W 750 MG/150ML IV SOLN: 750.0000 mg | Freq: Every day | INTRAVENOUS | Status: DC

## 2016-02-05 MED ORDER — IPRATROPIUM BROMIDE 0.02 % IN SOLN
0.5000 mg | Freq: Four times a day (QID) | RESPIRATORY_TRACT | Status: DC
  Administered 2016-02-06 – 2016-02-09 (×11): 0.5 mg via RESPIRATORY_TRACT
  Filled 2016-02-05 (×11): qty 2.5

## 2016-02-05 MED ORDER — LEVALBUTEROL HCL 0.63 MG/3ML IN NEBU
0.6300 mg | INHALATION_SOLUTION | Freq: Four times a day (QID) | RESPIRATORY_TRACT | Status: DC
  Administered 2016-02-06 – 2016-02-09 (×11): 0.63 mg via RESPIRATORY_TRACT
  Filled 2016-02-05 (×11): qty 3

## 2016-02-05 MED ORDER — LEVALBUTEROL HCL 0.63 MG/3ML IN NEBU: 0.6300 mg | INHALATION_SOLUTION | RESPIRATORY_TRACT | Status: DC | PRN

## 2016-02-05 MED ORDER — ONDANSETRON HCL 4 MG PO TABS: 4.0000 mg | ORAL_TABLET | Freq: Four times a day (QID) | ORAL | Status: DC | PRN

## 2016-02-05 MED ORDER — LEVALBUTEROL HCL 0.63 MG/3ML IN NEBU
0.6300 mg | INHALATION_SOLUTION | Freq: Four times a day (QID) | RESPIRATORY_TRACT | Status: DC
  Administered 2016-02-05 (×3): 0.63 mg via RESPIRATORY_TRACT
  Filled 2016-02-05 (×3): qty 3

## 2016-02-05 MED ORDER — POLYETHYLENE GLYCOL 3350 17 G PO PACK
17.0000 g | PACK | Freq: Every day | ORAL | Status: DC
  Administered 2016-02-05 – 2016-02-17 (×12): 17 g via ORAL
  Filled 2016-02-05 (×13): qty 1

## 2016-02-05 MED ORDER — LEVOFLOXACIN 750 MG PO TABS
750.0000 mg | ORAL_TABLET | Freq: Every day | ORAL | Status: DC
  Administered 2016-02-05: 750 mg via ORAL
  Filled 2016-02-05: qty 1

## 2016-02-05 MED ORDER — IPRATROPIUM BROMIDE 0.02 % IN SOLN: 0.5000 mg | RESPIRATORY_TRACT | Status: DC | PRN

## 2016-02-05 MED ORDER — ACETAMINOPHEN 650 MG RE SUPP: 650.0000 mg | Freq: Four times a day (QID) | RECTAL | Status: DC | PRN

## 2016-02-05 MED ORDER — SENNOSIDES-DOCUSATE SODIUM 8.6-50 MG PO TABS
2.0000 | ORAL_TABLET | Freq: Two times a day (BID) | ORAL | Status: DC
  Administered 2016-02-05 – 2016-02-17 (×21): 2 via ORAL
  Filled 2016-02-05 (×24): qty 2

## 2016-02-05 MED ORDER — DEXTROSE 5 % IV SOLN
1.0000 g | Freq: Three times a day (TID) | INTRAVENOUS | Status: DC
  Administered 2016-02-05 (×2): 1 g via INTRAVENOUS
  Filled 2016-02-05 (×3): qty 1

## 2016-02-05 MED ORDER — METHYLPREDNISOLONE SODIUM SUCC 125 MG IJ SOLR
60.0000 mg | Freq: Three times a day (TID) | INTRAMUSCULAR | Status: DC
  Administered 2016-02-05 – 2016-02-07 (×8): 60 mg via INTRAVENOUS
  Filled 2016-02-05 (×8): qty 2

## 2016-02-05 MED ORDER — SODIUM CHLORIDE 0.9 % IV BOLUS (SEPSIS)
1000.0000 mL | Freq: Once | INTRAVENOUS | Status: AC
Start: 2016-02-05 — End: 2016-02-05
  Administered 2016-02-05 (×2): 1000 mL via INTRAVENOUS

## 2016-02-05 MED ORDER — ACETAMINOPHEN 325 MG PO TABS: 650.0000 mg | ORAL_TABLET | Freq: Four times a day (QID) | ORAL | Status: DC | PRN

## 2016-02-05 MED ORDER — VANCOMYCIN HCL IN DEXTROSE 750-5 MG/150ML-% IV SOLN
750.0000 mg | Freq: Two times a day (BID) | INTRAVENOUS | Status: DC
  Administered 2016-02-05: 750 mg via INTRAVENOUS
  Filled 2016-02-05: qty 150

## 2016-02-05 MED ORDER — ENOXAPARIN SODIUM 40 MG/0.4ML ~~LOC~~ SOLN
40.0000 mg | SUBCUTANEOUS | Status: DC
  Administered 2016-02-05 – 2016-02-06 (×2): 40 mg via SUBCUTANEOUS
  Filled 2016-02-05 (×2): qty 0.4

## 2016-02-05 MED ORDER — ONDANSETRON HCL 4 MG/2ML IJ SOLN: 4.0000 mg | Freq: Four times a day (QID) | INTRAMUSCULAR | Status: DC | PRN

## 2016-02-05 MED ORDER — GUAIFENESIN ER 600 MG PO TB12
600.0000 mg | ORAL_TABLET | Freq: Two times a day (BID) | ORAL | Status: DC
  Administered 2016-02-05 – 2016-02-17 (×26): 600 mg via ORAL
  Filled 2016-02-05 (×26): qty 1

## 2016-02-05 MED ORDER — TERAZOSIN HCL 2 MG PO CAPS
2.0000 mg | ORAL_CAPSULE | Freq: Every day | ORAL | Status: DC
  Administered 2016-02-05 – 2016-02-16 (×13): 2 mg via ORAL
  Filled 2016-02-05 (×13): qty 1

## 2016-02-05 MED ORDER — ATORVASTATIN CALCIUM 40 MG PO TABS
40.0000 mg | ORAL_TABLET | Freq: Every day | ORAL | Status: DC
  Administered 2016-02-05 – 2016-02-16 (×11): 40 mg via ORAL
  Filled 2016-02-05 (×12): qty 1

## 2016-02-05 NOTE — Care Management Note (Signed)
Case Management Note  Patient Details  Name: DEANGELO KISSICK MRN: [id number] Date of Birth: [birth year]  Subjective/Objective:         Copd excerbation           Action/Plan: home   Expected Discharge Date:   (unknown)               Expected Discharge Plan:  Home/Self Care  In-House Referral:     Discharge planning Services     Post Acute Care Choice:    Choice offered to:     DME Arranged:    DME Agency:     HH Arranged:    [place] Agency:     Status of Service:  In process, will continue to follow  If discussed at Long Length of Stay Meetings, dates discussed:    Additional Comments:Date:  February 05, 2016 Chart reviewed for concurrent status and case management needs. Will continue to follow the patient for status change: Discharge Planning: following for needs Expected discharge date: [id number] [name], BSN, [place], [place] Leeroy Cha, RN 02/05/2016, 10:19 AM

## 2016-02-05 NOTE — Progress Notes (Signed)
82 year old gentleman admitted for sepsis from community acquired pneumonia and acute respiratory failure with hypoxia from copd exacerbation. He was started on IV steroids, IV antibiotics and admitted to step down for futher evaluation.  On exam.  He is alert and comfortable on 6 lit of [place] oxygen.  CVS s1s2,  Lungs scattered wheezing bilaterally.  Abdomen soft non tender non distended bowel sounds heard.  Extremities, no pedal edema.   Continue steroids change to po levaquin.   Hosie Poisson, MD [phone]

## 2016-02-05 NOTE — Progress Notes (Signed)
Pharmacy Antibiotic Note  Allen Beasley is a 82 y.o. male admitted on 02/04/2016 with pneumonia and sepsis.  Pharmacy has been consulted for Vancomycin, levofloxacin, aztreonam dosing.  Plan: Vancomycin 750mg  IV every 12 hours.  Goal trough 15-20 mcg/mL.  Aztreonam 1gm iv q8hr Levofloxacin 750mg  iv q24hr  Height: 5\' 11"  (180.3 cm) Weight: 186 lb 1.1 oz (84.4 kg) IBW/kg (Calculated) : 75.3  Temp (24hrs), Avg:99.8 F (37.7 C), Min:98.6 F (37 C), Max:100.9 F (38.3 C)   Recent Labs Lab 02/04/16 2146 02/04/16 2202 02/05/16 0044 02/05/16 0400  WBC 13.6*  --   --  17.5*  CREATININE 0.89  --   --   --   LATICACIDVEN 2.7* 3.22* 1.7 1.7    Estimated Creatinine Clearance: 58.8 mL/min (by C-G formula based on SCr of 0.89 mg/dL).    Allergies  Allergen Reactions  . Penicillins Hives and Swelling    Antimicrobials this admission: Vancomycin 10/26 >> Levofloxacin 10/25 >> Aztreonam 10/25 >>  Dose adjustments this admission: -  Microbiology results: pending  Thank you for allowing pharmacy to be a part of this patient's care.  Nani Skillern Crowford 02/05/2016 5:38 AM

## 2016-02-05 NOTE — ED Notes (Signed)
Hospitalist at bedside 

## 2016-02-05 NOTE — H&P (Signed)
History and Physical    Allen Beasley [id number] DOB: 03/04/1924 DOA: 02/04/2016  Referring MD/NP/PA: Dr. Wilson Singer PCP: Geoffery Lyons, MD  Patient coming from: Home  Chief Complaint: Shortness of breath  HPI: Allen Beasley is a 82 y.o. male with medical history significant of COPD/emphysema, HTN, HMD, aortic insufficiency; who presents with complaints of shortness of breath. Symptoms started acutely at around 3:57 PM. Patient notes taking his regular home medications of Sprivia, denies having any other inhalers. He also has a oxygen unit at home to use as needed, but notes that last time he tried to use it didn't work. The patient had not required use of oxygen and this year. The oxygen company was scheduled to come out tomorrow to to come fix his oxygen machine. Associated symptoms included wheezing, minimal cough, sharp left-sided abdominal pain, abdominal distention, constipation with last bowel movement 2 days ago, Patient denies any recent sick contacts, fever, chills, chest pain, urinary frequency, nausea, vomiting, diarrhea, leg swelling, or change in weight. Allen Beasley states that he does not receive the flu vaccine as he always gets sick thereafter.  ED Course: En route with EMS patient was given 125mg  Solumedrol, 10mg  Albuterol, and a DuoNeb treatment with mild relief of symptoms. Upon admission to emergency department patient was evaluated and seen to be febrile up to 100.25F, heart rate is 93-114, respirations 19-38, O2 saturations as low as 78%, and blood pressure seen as high as 194/77. Laboratory revealed WBC 13.6, BUN 31, creatinine 0.89, glucose 196, lactic acid elevated to 3.22. Patient was placed on BiPAP for short period in time but was able to be transitioned to 6 L of nasal cannula oxygen to maintain O2 sats greater than 92%. Chest x-ray showing possible left lung base atelectasis versus infiltrate. Empiric antibiotics of Levaquin, vancomycin, and aztreonam due to history of  penicillin allergy. Patient also had an I&D of a lesion on the posterior scalp.  Review of Systems: As per HPI otherwise 10 point review of systems negative.   Past Medical History:  Diagnosis Date  . COPD (chronic obstructive pulmonary disease) ([place])   . Erectile dysfunction   . History of aortic insufficiency   . History of chest pain   . Hyperlipidemia   . Hypertension     Past Surgical History:  Procedure Laterality Date  . CATARACT EXTRACTION, BILATERAL  2001  . JOINT REPLACEMENT    . LITHOTRIPSY  2001  . TONSILECTOMY, ADENOIDECTOMY, BILATERAL MYRINGOTOMY AND TUBES  1932  . TOTAL HIP ARTHROPLASTY  07   right     reports that he quit smoking about 14 years ago. His smoking use included Cigarettes. He has a 59.00 pack-year smoking history. He has never used smokeless tobacco. He reports that he drinks alcohol. His drug history is not on file.  Allergies  Allergen Reactions  . Penicillins Hives and Swelling    Family History  Problem Relation Age of Onset  . Cancer Father   . Heart disease Father   . Heart attack Father     Prior to Admission medications   Medication Sig Start Date End Date Taking? Authorizing Provider  simvastatin (ZOCOR) 80 MG tablet Take 80 mg by mouth daily.   Yes Historical Provider, MD  SPIRIVA HANDIHALER 18 MCG inhalation capsule PLACE 1 CAPSULE INTO INHALER AND INHALE THE CONTENTS OF 1 CAPSULE ONCE DAILY 04/02/14  Yes Deneise Lever, MD  terazosin (HYTRIN) 2 MG capsule Take 2 mg by mouth at bedtime.  Yes Historical Provider, MD  doxycycline (VIBRAMYCIN) 100 MG capsule Take 1 capsule by mouth 2 (two) times daily. 02/03/16   Historical Provider, MD    Physical Exam:    Constitutional: Elderly male in moderate respiratory distress  Vitals:   02/04/16 2230 02/04/16 2300 02/04/16 2356 02/05/16 0000  BP: 139/71 122/90  141/60  Pulse: 105  102 100  Resp: 24  21 20   Temp:      TempSrc:      SpO2: 100% 100% 95% 91%   Eyes: PERRL, lids  and conjunctivae normal ENMT: Mucous membranes are moist. Posterior pharynx clear of any exudate or lesions.Normal dentition.  Neck: normal, supple, no masses, no thyromegaly Respiratory: Tachypneic with these increased aeration expiratory wheezes appreciated. Patient talking in short sentences on 6 L nasal cannula oxygen at this time. Cardiovascular: Tachycardic, no murmurs / rubs / gallops. No extremity edema. 2+ pedal pulses. No carotid bruits.  Abdomen:Abdominal distention with what appears to be ventral hernia.   tenderness to palpation no masses palpated. No hepatosplenomegaly. Bowel sounds positive.  Musculoskeletal: no clubbing / cyanosis. No joint deformity upper and lower extremities. Good ROM, no contractures. Normal muscle tone.  Skin:Large golfball sized lesion of the posterior aspect of the neck that is firm and mobile. Abscess of posterior scalp was status post I&D.  Neurologic: CN 2-12 grossly intact. Sensation intact, DTR normal. Strength 4+/5 in all 4.  Psychiatric: Normal judgment and insight. Alert and oriented x 3. Normal mood.     Labs on Admission: I have personally reviewed following labs and imaging studies  CBC:  Recent Labs Lab 02/04/16 2146  WBC 13.6*  NEUTROABS 10.3*  HGB 14.6  HCT 46.1  MCV 92.0  PLT [id number]   Basic Metabolic Panel:  Recent Labs Lab 02/04/16 2146  NA 139  K 4.0  CL 101  CO2 27  GLUCOSE 196*  BUN 31*  CREATININE 0.89  CALCIUM 9.1   GFR: CrCl cannot be calculated (Unknown ideal weight.). Liver Function Tests:  Recent Labs Lab 02/04/16 2146  AST 23  ALT 13*  ALKPHOS 63  BILITOT 0.8  PROT 7.5  ALBUMIN 4.0    Recent Labs Lab 02/04/16 2146  LIPASE 19   No results for input(s): AMMONIA in the last 168 hours. Coagulation Profile: No results for input(s): INR, PROTIME in the last 168 hours. Cardiac Enzymes: No results for input(s): CKTOTAL, CKMB, CKMBINDEX, TROPONINI in the last 168 hours. BNP (last 3 results) No  results for input(s): PROBNP in the last 8760 hours. HbA1C: No results for input(s): HGBA1C in the last 72 hours. CBG: No results for input(s): GLUCAP in the last 168 hours. Lipid Profile: No results for input(s): CHOL, HDL, LDLCALC, TRIG, CHOLHDL, LDLDIRECT in the last 72 hours. Thyroid Function Tests: No results for input(s): TSH, T4TOTAL, FREET4, T3FREE, THYROIDAB in the last 72 hours. Anemia Panel: No results for input(s): VITAMINB12, FOLATE, FERRITIN, TIBC, IRON, RETICCTPCT in the last 72 hours. Urine analysis:    Component Value Date/Time   COLORURINE AMBER (A) 02/04/2016 2139   APPEARANCEUR CLOUDY (A) 02/04/2016 2139   LABSPEC 1.023 02/04/2016 2139   PHURINE 6.0 02/04/2016 2139   GLUCOSEU NEGATIVE 02/04/2016 2139   HGBUR LARGE (A) 02/04/2016 2139   BILIRUBINUR NEGATIVE 02/04/2016 2139   KETONESUR NEGATIVE 02/04/2016 2139   PROTEINUR 100 (A) 02/04/2016 2139   UROBILINOGEN 0.2 08/19/2012 1511   NITRITE NEGATIVE 02/04/2016 2139   LEUKOCYTESUR TRACE (A) 02/04/2016 2139   Sepsis Labs: No results found  for this or any previous visit (from the past 240 hour(s)).   Radiological Exams on Admission: Ct Abdomen Pelvis Wo Contrast  Result Date: 02/05/2016 CLINICAL DATA:  Shortness of breath wheezing distended abdomen EXAM: CT ABDOMEN AND PELVIS WITHOUT CONTRAST TECHNIQUE: Multidetector CT imaging of the abdomen and pelvis was performed following the standard protocol without IV contrast. COMPARISON:  06/19/2013, radiograph 02/04/2016 FINDINGS: Lower chest: Small left-sided pleural effusion. Moderate emphysematous disease at the bilateral lung bases. Mild right middle lobe atelectasis. Patchy right lower lobe atelectasis. Partial consolidation in the posterior left lower lobe. Partial consolidation within the lingula. Coronary artery calcifications. Aortic valvular calcifications. Mitral valvular calcifications. Heart size is slightly enlarged. Hepatobiliary: No focal hepatic  abnormalities. Mild increased density in the gallbladder lumen could reflect small amount of sludge or stones. No biliary dilatation. Pancreas: Pancreas is atrophic.  No inflammatory changes. Spleen: Normal in size without focal abnormality. Adrenals/Urinary Tract: Mild nodular thickening of the adrenal glands without discrete nodule. No hydronephrosis. Small stones are vascular calcifications within the left kidney. The bladder demonstrates right posterior lobulated contour. Stomach/Bowel: No significant bowel dilatation. No obstruction. Scattered colon diverticula without acute inflammation. Vascular/Lymphatic: Multifocal ectasia of the abdominal aorta. Atherosclerotic vascular disease. No pathologically enlarged retroperitoneal or mesenteric lymph nodes. Reproductive: Prostate gland is enlarged with calcifications. Other: No free air or free fluid. Musculoskeletal: Status post right hip replacement with no acute dislocation. Multilevel degenerative changes of the spine with marked posterior facet changes. IMPRESSION: 1. No CT evidence for bowel obstruction. 2. Small left-sided pleural effusion. Moderate emphysematous disease at the bilateral lung bases. Partial consolidation in the left lower lobe and lingula, possible pneumonia. Continued imaging follow-up recommended to document resolution. 3. Mild cardiomegaly. 4. Atherosclerotic vascular disease of the aorta and branch vessels. Electronically Signed   By: Donavan Foil M.D.   On: 02/05/2016 00:31   Dg Chest Portable 1 View  Result Date: 02/04/2016 CLINICAL DATA:  82 year old male with fever and hypoxemia. EXAM: PORTABLE CHEST 1 VIEW COMPARISON:  Chest CT dated 06/19/2013 FINDINGS: There is emphysematous changes of the lungs. Small left pleural effusion with associated left lung base compressive atelectasis versus infiltrate. There bibasilar atelectatic changes/ scarring. No pneumothorax. There is mild cardiomegaly. The aorta is tortuous. There is  atherosclerotic calcification of the thoracic aorta. Old healed left rib fracture. No acute fracture. IMPRESSION: Small left pleural effusion with associated left lung base atelectasis versus infiltrate. Clinical correlation and follow-up resolution recommended. Emphysema. Electronically Signed   By: Anner Crete M.D.   On: 02/04/2016 22:56      Assessment/Plan Sepsis secondary to suspected community-acquired pneumonia: Acute. Patient with reported acute onset of shortness of breath x-ray imaging center for pneumonia versus atelectasis. Patient was noted to be febrile for which gave more concerned for infection. - Admit to stepdown - Check procalcitonin - Follow up blood, urine, and sputum cultures - Continue empiric antibiotics of Levaquin, aztreonam, and vancomycin due to penicillin allergy  - Mucinex - Check EKG and troponin  Acute respiratory failure with hypoxia/ COPD exacerbation - Continuous pulse oximetry, with nasal cannula oxygen to keep O2 sats greater than 92% - Bipap prn - DuoNeb's - Solu-Medrol IV 60 mg q8hrs  Hyperglycemia: Acute. Initial blood glucose 196 on admission no previous history of diabetes.  Suspect that this is secondary to recently given steroids. - Continue to monitor  Constipation: Reports last bowel movement 2 days ago. -  MiraLAX scheduled until bowel movement     BPH - Continue Terazosin  Hyperlipidemia - Continue pharmacy substitution of atorvastatin for simvastatin  DVT prophylaxis: lovenox Code Status:  patient notes that he wants to be full code  Family Communication:  no family present at bedside  Disposition Plan: TBD Consults called: none Admission status: stepdown inpatient  Norval Morton MD [facility] Pager [phone]  If 7PM-7AM, please contact night-coverage [email or web address] Password [email or web address]  02/05/2016, 12:55 AM

## 2016-02-05 NOTE — Progress Notes (Signed)
Pt taken off BIPAP and placed on 6 LPM [place].  Pt tolerating well at this time.  MD aware, RT to monitor and assess as needed.

## 2016-02-06 ENCOUNTER — Inpatient Hospital Stay (HOSPITAL_COMMUNITY): Payer: Medicare Other

## 2016-02-06 LAB — CBC
HCT: 39.3 % (ref 39.0–52.0)
HEMOGLOBIN: 12.5 g/dL — AB (ref 13.0–17.0)
MCH: 28.8 pg (ref 26.0–34.0)
MCHC: 31.8 g/dL (ref 30.0–36.0)
MCV: 90.6 fL (ref 78.0–100.0)
PLATELETS: 223 10*3/uL (ref 150–400)
RBC: 4.34 MIL/uL (ref 4.22–5.81)
RDW: 13.2 % (ref 11.5–15.5)
WBC: 17.6 10*3/uL — AB (ref 4.0–10.5)

## 2016-02-06 LAB — URINE MICROSCOPIC-ADD ON

## 2016-02-06 LAB — URINE CULTURE: CULTURE: NO GROWTH

## 2016-02-06 LAB — LEGIONELLA PNEUMOPHILA SEROGP 1 UR AG: L. pneumophila Serogp 1 Ur Ag: NEGATIVE

## 2016-02-06 LAB — BASIC METABOLIC PANEL
ANION GAP: 8 (ref 5–15)
BUN: 33 mg/dL — ABNORMAL HIGH (ref 6–20)
CHLORIDE: 101 mmol/L (ref 101–111)
CO2: 27 mmol/L (ref 22–32)
Calcium: 8.8 mg/dL — ABNORMAL LOW (ref 8.9–10.3)
Creatinine, Ser: 0.59 mg/dL — ABNORMAL LOW (ref 0.61–1.24)
GFR calc Af Amer: >60 mL/min (ref >60)
Glucose, Bld: 159 mg/dL — ABNORMAL HIGH (ref 65–99)
POTASSIUM: 3.9 mmol/L (ref 3.5–5.1)
SODIUM: 136 mmol/L (ref 135–145)

## 2016-02-06 LAB — URINALYSIS, ROUTINE W REFLEX MICROSCOPIC
Bilirubin Urine: NEGATIVE
GLUCOSE, UA: 100 mg/dL — AB
Ketones, ur: NEGATIVE mg/dL
Nitrite: NEGATIVE
PROTEIN: 100 mg/dL — AB
SPECIFIC GRAVITY, URINE: 1.027 (ref 1.005–1.030)
pH: 6 (ref 5.0–8.0)

## 2016-02-06 MED ORDER — FUROSEMIDE 10 MG/ML IJ SOLN
20.0000 mg | Freq: Two times a day (BID) | INTRAMUSCULAR | Status: DC
  Administered 2016-02-06 – 2016-02-07 (×2): 20 mg via INTRAVENOUS
  Filled 2016-02-06 (×2): qty 2

## 2016-02-06 MED ORDER — ZOLPIDEM TARTRATE 5 MG PO TABS
5.0000 mg | ORAL_TABLET | Freq: Once | ORAL | Status: AC
  Administered 2016-02-06: 5 mg via ORAL
  Filled 2016-02-06: qty 1

## 2016-02-06 MED ORDER — MAGNESIUM HYDROXIDE 400 MG/5ML PO SUSP
30.0000 mL | Freq: Every day | ORAL | Status: DC | PRN
  Filled 2016-02-06: qty 30

## 2016-02-06 MED ORDER — LEVOFLOXACIN IN D5W 750 MG/150ML IV SOLN
750.0000 mg | INTRAVENOUS | Status: DC
  Administered 2016-02-06 – 2016-02-14 (×9): 750 mg via INTRAVENOUS
  Filled 2016-02-06 (×9): qty 150

## 2016-02-06 MED ORDER — IOPAMIDOL (ISOVUE-300) INJECTION 61%: 30.0000 mL | Freq: Once | INTRAVENOUS | Status: DC | PRN

## 2016-02-06 MED ORDER — IOPAMIDOL (ISOVUE-300) INJECTION 61%
100.0000 mL | Freq: Once | INTRAVENOUS | Status: AC | PRN
  Administered 2016-02-06: 100 mL via INTRAVENOUS

## 2016-02-06 NOTE — Progress Notes (Signed)
Pt requested that I call his PCP  Dr. Burnard Bunting with [facility] and make him aware that he is in the hospital, so I call his office at [phone] and left a phone message with his assistant.

## 2016-02-06 NOTE — Progress Notes (Signed)
Called to make sure that MD is aware that the patients urine is now dark and [place] in color. MD ordered for UA. Will collect and send to lab. Pt urine output seems to be ok at this time. Asymptomatic otherwise. Will continue to monitor.

## 2016-02-06 NOTE — Progress Notes (Signed)
Pt vomited a small to moderate amount of [place] emesis again this am. Will make MD aware and continue to monitor.

## 2016-02-06 NOTE — Progress Notes (Signed)
PROGRESS NOTE    Allen Beasley  [id number] DOB: 12-Dec-1923 DOA: 02/04/2016 PCP: Geoffery Lyons, MD    Brief Narrative: 82 year old gentleman admitted for sepsis from community acquired pneumonia and acute respiratory failure with hypoxia from copd exacerbation. He was started on IV steroids, IV antibiotics and admitted to step down for futher evaluation.   Assessment & Plan:   Principal Problem:   Sepsis ([place]) Active Problems:   CAP (community acquired pneumonia)   Acute respiratory failure with hypoxia ([place])   BPH (benign prostatic hyperplasia)   Hyperglycemia   Sepsis from community acquired pneumonia:  - improved.  - persistent leukocytosis, though afebrile.  - coffee ground emesis earlier this am and last night.  - lactic acid normalized, and pro calcitonin elevated.  - CT abdomen and pelvis ordered.   Acute respiratory failure with hypoxia probably from a combination of copd exacerbation and community acquired pneumonia.  Started on IV steroids, levaquin. Urine for legionella and strep pneumonia antigen negative.  Blood cultures are negative so far.  [place] oxygen as needed to keep sats greater than 90%.   Hypertension: controlled.         DVT prophylaxis: scd's Code Status: full code.  Family Communication: none at bedside.  Disposition Plan: pending further eval.    Consultants:   None.    Procedures:  None.    Antimicrobials: one dose of vancomycin and aztreonam.   levaquin from 10/26   Subjective: Reports had vomiting once last night and this am.   Objective: Vitals:   02/06/16 0813 02/06/16 0850 02/06/16 0900 02/06/16 1200  BP:   133/75   Pulse: 80  86   Resp: (!) 25  20   Temp:    97.4 F (36.3 C)  TempSrc:    Oral  SpO2: 92% 91% 92%   Weight:      Height:        Intake/Output Summary (Last 24 hours) at 02/06/16 1621 Last data filed at 02/06/16 0635  Gross per 24 hour  Intake              210 ml  Output              800  ml  Net             -590 ml   Filed Weights   02/05/16 0215 02/05/16 0500  Weight: 84.4 kg (186 lb 1.1 oz) 84.4 kg (186 lb 1.1 oz)    Examination:  General exam: Appears comfortable still on high flow oxygen.  Respiratory system: scattered wheezing.  Cardiovascular system: S1 & S2 heard, RRR. No JVD, murmurs, rubs, gallops or clicks. No pedal edema. Gastrointestinal system: Abdomen is nondistended, soft and nontender. No organomegaly or masses felt. Normal bowel sounds heard. Central nervous system: Alert and oriented. No focal neurological deficits. Extremities: Symmetric 5 x 5 power. Skin: No rashes, lesions or ulcers Psychiatry: Judgement and insight appear normal. Mood & affect appropriate.     Data Reviewed: I have personally reviewed following labs and imaging studies  CBC:  Recent Labs Lab 02/04/16 2146 02/05/16 0400 02/06/16 0358  WBC 13.6* 17.5* 17.6*  NEUTROABS 10.3*  --   --   HGB 14.6 13.1 12.5*  HCT 46.1 41.2 39.3  MCV 92.0 90.9 90.6  PLT 222 203 [id number]   Basic Metabolic Panel:  Recent Labs Lab 02/04/16 2146  NA 139  K 4.0  CL 101  CO2 27  GLUCOSE 196*  BUN 31*  CREATININE 0.89  CALCIUM 9.1   GFR: Estimated Creatinine Clearance: 58.8 mL/min (by C-G formula based on SCr of 0.89 mg/dL). Liver Function Tests:  Recent Labs Lab 02/04/16 2146  AST 23  ALT 13*  ALKPHOS 63  BILITOT 0.8  PROT 7.5  ALBUMIN 4.0    Recent Labs Lab 02/04/16 2146  LIPASE 19   No results for input(s): AMMONIA in the last 168 hours. Coagulation Profile: No results for input(s): INR, PROTIME in the last 168 hours. Cardiac Enzymes:  Recent Labs Lab 02/05/16 0400 02/05/16 1008 02/05/16 1601  TROPONINI <0.03 <0.03 <0.03   BNP (last 3 results) No results for input(s): PROBNP in the last 8760 hours. HbA1C: No results for input(s): HGBA1C in the last 72 hours. CBG: No results for input(s): GLUCAP in the last 168 hours. Lipid Profile: No results for  input(s): CHOL, HDL, LDLCALC, TRIG, CHOLHDL, LDLDIRECT in the last 72 hours. Thyroid Function Tests: No results for input(s): TSH, T4TOTAL, FREET4, T3FREE, THYROIDAB in the last 72 hours. Anemia Panel: No results for input(s): VITAMINB12, FOLATE, FERRITIN, TIBC, IRON, RETICCTPCT in the last 72 hours. Sepsis Labs:  Recent Labs Lab 02/04/16 2146 02/04/16 2202 02/05/16 0044 02/05/16 0400  PROCALCITON  --   --   --  3.14  LATICACIDVEN 2.7* 3.22* 1.7 1.7    Recent Results (from the past 240 hour(s))  Urine culture     Status: Abnormal   Collection Time: 02/04/16  9:39 PM  Result Value Ref Range Status   Specimen Description URINE, CLEAN CATCH  Final   Special Requests NONE  Final   Culture MULTIPLE SPECIES PRESENT, SUGGEST RECOLLECTION (A)  Final   Report Status 02/06/2016 FINAL  Final  Blood culture (routine x 2)     Status: None (Preliminary result)   Collection Time: 02/04/16  9:44 PM  Result Value Ref Range Status   Specimen Description BLOOD LEFT WRIST  Final   Special Requests BOTTLES DRAWN AEROBIC AND ANAEROBIC 5CC  Final   Culture   Final    NO GROWTH 2 DAYS Performed at [facility]    Report Status PENDING  Incomplete  MRSA PCR Screening     Status: None   Collection Time: 02/05/16  2:40 AM  Result Value Ref Range Status   MRSA by PCR NEGATIVE NEGATIVE Final    Comment:        The GeneXpert MRSA Assay (FDA approved for NASAL specimens only), is one component of a comprehensive MRSA colonization surveillance program. It is not intended to diagnose MRSA infection nor to guide or monitor treatment for MRSA infections.   Blood culture (routine x 2)     Status: None (Preliminary result)   Collection Time: 02/05/16  4:00 AM  Result Value Ref Range Status   Specimen Description BLOOD LEFT ARM  Final   Special Requests BOTTLES DRAWN AEROBIC ONLY 6 CC  Final   Culture   Final    NO GROWTH 1 DAY Performed at [facility]    Report Status PENDING   Incomplete  Culture, Urine     Status: None   Collection Time: 02/05/16  4:30 AM  Result Value Ref Range Status   Specimen Description URINE, RANDOM  Final   Special Requests NONE  Final   Culture NO GROWTH Performed at [facility]   Final   Report Status 02/06/2016 FINAL  Final         Radiology Studies: Ct Abdomen Pelvis Wo Contrast  Result  Date: 02/05/2016 CLINICAL DATA:  Shortness of breath wheezing distended abdomen EXAM: CT ABDOMEN AND PELVIS WITHOUT CONTRAST TECHNIQUE: Multidetector CT imaging of the abdomen and pelvis was performed following the standard protocol without IV contrast. COMPARISON:  06/19/2013, radiograph 02/04/2016 FINDINGS: Lower chest: Small left-sided pleural effusion. Moderate emphysematous disease at the bilateral lung bases. Mild right middle lobe atelectasis. Patchy right lower lobe atelectasis. Partial consolidation in the posterior left lower lobe. Partial consolidation within the lingula. Coronary artery calcifications. Aortic valvular calcifications. Mitral valvular calcifications. Heart size is slightly enlarged. Hepatobiliary: No focal hepatic abnormalities. Mild increased density in the gallbladder lumen could reflect small amount of sludge or stones. No biliary dilatation. Pancreas: Pancreas is atrophic.  No inflammatory changes. Spleen: Normal in size without focal abnormality. Adrenals/Urinary Tract: Mild nodular thickening of the adrenal glands without discrete nodule. No hydronephrosis. Small stones are vascular calcifications within the left kidney. The bladder demonstrates right posterior lobulated contour. Stomach/Bowel: No significant bowel dilatation. No obstruction. Scattered colon diverticula without acute inflammation. Vascular/Lymphatic: Multifocal ectasia of the abdominal aorta. Atherosclerotic vascular disease. No pathologically enlarged retroperitoneal or mesenteric lymph nodes. Reproductive: Prostate gland is enlarged with  calcifications. Other: No free air or free fluid. Musculoskeletal: Status post right hip replacement with no acute dislocation. Multilevel degenerative changes of the spine with marked posterior facet changes. IMPRESSION: 1. No CT evidence for bowel obstruction. 2. Small left-sided pleural effusion. Moderate emphysematous disease at the bilateral lung bases. Partial consolidation in the left lower lobe and lingula, possible pneumonia. Continued imaging follow-up recommended to document resolution. 3. Mild cardiomegaly. 4. Atherosclerotic vascular disease of the aorta and branch vessels. Electronically Signed   By: Donavan Foil M.D.   On: 02/05/2016 00:31   Dg Abd 1 View  Result Date: 02/06/2016 CLINICAL DATA:  New onset vomiting and mid abdominal pain with bloating EXAM: ABDOMEN - 1 VIEW COMPARISON:  CT 02/04/2016 FINDINGS: Small left-sided pleural effusion. Bibasilar atelectasis or infiltrates. Moderate air distention of the stomach. Bowel gas pattern nonspecific. Moderate stool right colon. Status post right hip replacement. IMPRESSION: 1. Small left-sided pleural effusion with bibasilar infiltrates. 2. Moderate air distention of the stomach. Overall gas pattern nonspecific. Electronically Signed   By: Donavan Foil M.D.   On: 02/06/2016 03:40   Dg Chest Portable 1 View  Result Date: 02/04/2016 CLINICAL DATA:  82 year old male with fever and hypoxemia. EXAM: PORTABLE CHEST 1 VIEW COMPARISON:  Chest CT dated 06/19/2013 FINDINGS: There is emphysematous changes of the lungs. Small left pleural effusion with associated left lung base compressive atelectasis versus infiltrate. There bibasilar atelectatic changes/ scarring. No pneumothorax. There is mild cardiomegaly. The aorta is tortuous. There is atherosclerotic calcification of the thoracic aorta. Old healed left rib fracture. No acute fracture. IMPRESSION: Small left pleural effusion with associated left lung base atelectasis versus infiltrate. Clinical  correlation and follow-up resolution recommended. Emphysema. Electronically Signed   By: Anner Crete M.D.   On: 02/04/2016 22:56        Scheduled Meds: . atorvastatin  40 mg Oral q1800  . enoxaparin (LOVENOX) injection  40 mg Subcutaneous Q24H  . guaiFENesin  600 mg Oral BID  . ipratropium  0.5 mg Nebulization QID  . levalbuterol  0.63 mg Nebulization QID  . levofloxacin  750 mg Oral QHS  . methylPREDNISolone (SOLU-MEDROL) injection  60 mg Intravenous Q8H  . polyethylene glycol  17 g Oral Daily  . senna-docusate  2 tablet Oral BID  . terazosin  2 mg Oral QHS  Continuous Infusions:    LOS: 1 day    Time spent: 30 minutes.     Hosie Poisson, MD [facility] Pager [phone]   If 7PM-7AM, please contact night-coverage [email or web address] Password [email or web address] 02/06/2016, 4:21 PM

## 2016-02-06 NOTE — Progress Notes (Signed)
Patient vomitted coffee ground looking vomitus, , M.D. Informed. Ordered STAT Abd Xray and CBC, Will continue to monitor.

## 2016-02-07 LAB — CBC WITH DIFFERENTIAL/PLATELET
BASOS ABS: 0 10*3/uL (ref 0.0–0.1)
BASOS PCT: 0 %
EOS PCT: 0 %
Eosinophils Absolute: 0 10*3/uL (ref 0.0–0.7)
HCT: 43.5 % (ref 39.0–52.0)
Hemoglobin: 14 g/dL (ref 13.0–17.0)
LYMPHS PCT: 3 %
Lymphs Abs: 0.6 10*3/uL — ABNORMAL LOW (ref 0.7–4.0)
MCH: 29.2 pg (ref 26.0–34.0)
MCHC: 32.2 g/dL (ref 30.0–36.0)
MCV: 90.8 fL (ref 78.0–100.0)
MONO ABS: 1.2 10*3/uL — AB (ref 0.1–1.0)
Monocytes Relative: 6 %
Neutro Abs: 16.6 10*3/uL — ABNORMAL HIGH (ref 1.7–7.7)
Neutrophils Relative %: 91 %
PLATELETS: 287 10*3/uL (ref 150–400)
RBC: 4.79 MIL/uL (ref 4.22–5.81)
RDW: 13 % (ref 11.5–15.5)
WBC: 18.4 10*3/uL — AB (ref 4.0–10.5)

## 2016-02-07 LAB — BASIC METABOLIC PANEL
ANION GAP: 11 (ref 5–15)
BUN: 35 mg/dL — ABNORMAL HIGH (ref 6–20)
CALCIUM: 8.9 mg/dL (ref 8.9–10.3)
CO2: 31 mmol/L (ref 22–32)
Chloride: 95 mmol/L — ABNORMAL LOW (ref 101–111)
Creatinine, Ser: 0.77 mg/dL (ref 0.61–1.24)
GLUCOSE: 236 mg/dL — AB (ref 65–99)
POTASSIUM: 4 mmol/L (ref 3.5–5.1)
SODIUM: 137 mmol/L (ref 135–145)

## 2016-02-07 MED ORDER — METHYLPREDNISOLONE SODIUM SUCC 40 MG IJ SOLR: 40.0000 mg | Freq: Two times a day (BID) | INTRAMUSCULAR | Status: DC

## 2016-02-07 MED ORDER — FUROSEMIDE 10 MG/ML IJ SOLN
40.0000 mg | Freq: Two times a day (BID) | INTRAMUSCULAR | Status: DC
  Administered 2016-02-07 – 2016-02-09 (×4): 40 mg via INTRAVENOUS
  Filled 2016-02-07 (×4): qty 4

## 2016-02-07 NOTE — Progress Notes (Signed)
Palliative Medicine Team consult was received.   I stopped by to meet with Mr. Hersi and he received his tray shortly after.  Would prefer that I come back tomorrow.  Will follow up tomorrow.  If there are urgent needs or questions please call [phone]. Thank you for consulting out team to assist with this patients care.  Micheline Rough, MD [place] Team [phone]

## 2016-02-07 NOTE — Progress Notes (Signed)
PROGRESS NOTE    Allen Beasley  [id number] DOB: [birth year] DOA: 02/04/2016 PCP: Geoffery Lyons, MD    Brief Narrative: 82 year old gentleman admitted for sepsis from community acquired pneumonia and acute respiratory failure with hypoxia from copd exacerbation. He was started on IV steroids, IV antibiotics and admitted to step down for futher evaluation.   Assessment & Plan:   Principal Problem:   Sepsis ([place]) Active Problems:   CAP (community acquired pneumonia)   Acute respiratory failure with hypoxia ([place])   BPH (benign prostatic hyperplasia)   Hyperglycemia   Sepsis from community acquired pneumonia:  - improved.  - persistent leukocytosis, though afebrile, probably from the steroids.  - lactic acid normalized, and pro calcitonin elevated.  - CT abdomen and pelvis ordered for coffee ground emesis, which showed constipation,   Acute respiratory failure with hypoxia probably from a combination of copd exacerbation and community acquired pneumonia.  Started on IV steroids, levaquin. Urine for legionella and strep pneumonia antigen negative. Started tapering steroids as his wheezing has improved and would resume levaquin.  Blood cultures are negative so far.  [place] oxygen as needed to keep sats greater than 90%, currently at 10 liters.    Hypertension: controlled.    BPH: on hytrin.    Constipation:  MOM helped resolve. 3 BM last night.   Moderate bilateral pleural effusions: Started him on lasix for diuresis. Watch his creatinine and potassium while on lasix.    Hematuria: lovenox stopped and on SCD'S for DVT prophylaxis, once it clears up , will restart the lovenox.  Unclear etiology. No hydronephrosis on CT abd and plevis, no UTI and he denies any dysuria. Urine cultures have been negative.   Social:  Pt lives by himself and apparently took himself off his oxygen,.  Family wants him to go to ALF.  Palliative care consulted for goals of care.  Case manager  consult for home health needs.   DVT prophylaxis: scd's Code Status: full code.  Family Communication: son at bedside, discussed the plan of care with the patient,  Disposition Plan: pending PT evaluation.    Consultants:   Palliative care consult.    Procedures:  None.    Antimicrobials: one dose of vancomycin and aztreonam.   levaquin from 10/26   Subjective: No vomiting, feels much better today.    Objective: Vitals:   02/07/16 0416 02/07/16 0500 02/07/16 0600 02/07/16 0800  BP:  (!) 151/57 (!) 158/106 (!) 153/63  Pulse:  75 81 81  Resp:  16 (!) 23 19  Temp: 97.7 F (36.5 C)   98.1 F (36.7 C)  TempSrc: Axillary   Oral  SpO2:  90% 90%   Weight:      Height:        Intake/Output Summary (Last 24 hours) at 02/07/16 1513 Last data filed at 02/07/16 0916  Gross per 24 hour  Intake              150 ml  Output             1550 ml  Net            -1400 ml   Filed Weights   02/05/16 0215 02/05/16 0500  Weight: 84.4 kg (186 lb 1.1 oz) 84.4 kg (186 lb 1.1 oz)    Examination:  General exam: Appears comfortable still on high flow oxygen.  Respiratory system: scattered wheezing.  Cardiovascular system: S1 & S2 heard, RRR. No JVD, murmurs, rubs, gallops or  clicks. No pedal edema. Gastrointestinal system: Abdomen is nondistended, soft and nontender. No organomegaly or masses felt. Normal bowel sounds heard. Central nervous system: Alert and oriented. No focal neurological deficits. Extremities: Symmetric 5 x 5 power. Skin: No rashes, lesions or ulcers Psychiatry: Judgement and insight appear normal. Mood & affect appropriate.     Data Reviewed: I have personally reviewed following labs and imaging studies  CBC:  Recent Labs Lab 02/04/16 2146 02/05/16 0400 02/06/16 0358  WBC 13.6* 17.5* 17.6*  NEUTROABS 10.3*  --   --   HGB 14.6 13.1 12.5*  HCT 46.1 41.2 39.3  MCV 92.0 90.9 90.6  PLT 222 203 [id number]   Basic Metabolic Panel:  Recent Labs Lab  02/04/16 2146 02/06/16 1754  NA 139 136  K 4.0 3.9  CL 101 101  CO2 27 27  GLUCOSE 196* 159*  BUN 31* 33*  CREATININE 0.89 0.59*  CALCIUM 9.1 8.8*   GFR: Estimated Creatinine Clearance: 65.4 mL/min (by C-G formula based on SCr of 0.59 mg/dL (L)). Liver Function Tests:  Recent Labs Lab 02/04/16 2146  AST 23  ALT 13*  ALKPHOS 63  BILITOT 0.8  PROT 7.5  ALBUMIN 4.0    Recent Labs Lab 02/04/16 2146  LIPASE 19   No results for input(s): AMMONIA in the last 168 hours. Coagulation Profile: No results for input(s): INR, PROTIME in the last 168 hours. Cardiac Enzymes:  Recent Labs Lab 02/05/16 0400 02/05/16 1008 02/05/16 1601  TROPONINI <0.03 <0.03 <0.03   BNP (last 3 results) No results for input(s): PROBNP in the last 8760 hours. HbA1C: No results for input(s): HGBA1C in the last 72 hours. CBG: No results for input(s): GLUCAP in the last 168 hours. Lipid Profile: No results for input(s): CHOL, HDL, LDLCALC, TRIG, CHOLHDL, LDLDIRECT in the last 72 hours. Thyroid Function Tests: No results for input(s): TSH, T4TOTAL, FREET4, T3FREE, THYROIDAB in the last 72 hours. Anemia Panel: No results for input(s): VITAMINB12, FOLATE, FERRITIN, TIBC, IRON, RETICCTPCT in the last 72 hours. Sepsis Labs:  Recent Labs Lab 02/04/16 2146 02/04/16 2202 02/05/16 0044 02/05/16 0400  PROCALCITON  --   --   --  3.14  LATICACIDVEN 2.7* 3.22* 1.7 1.7    Recent Results (from the past 240 hour(s))  Urine culture     Status: Abnormal   Collection Time: 02/04/16  9:39 PM  Result Value Ref Range Status   Specimen Description URINE, CLEAN CATCH  Final   Special Requests NONE  Final   Culture MULTIPLE SPECIES PRESENT, SUGGEST RECOLLECTION (A)  Final   Report Status 02/06/2016 FINAL  Final  Blood culture (routine x 2)     Status: None (Preliminary result)   Collection Time: 02/04/16  9:44 PM  Result Value Ref Range Status   Specimen Description BLOOD LEFT WRIST  Final   Special  Requests BOTTLES DRAWN AEROBIC AND ANAEROBIC 5CC  Final   Culture   Final    NO GROWTH 3 DAYS Performed at [facility]    Report Status PENDING  Incomplete  MRSA PCR Screening     Status: None   Collection Time: 02/05/16  2:40 AM  Result Value Ref Range Status   MRSA by PCR NEGATIVE NEGATIVE Final    Comment:        The GeneXpert MRSA Assay (FDA approved for NASAL specimens only), is one component of a comprehensive MRSA colonization surveillance program. It is not intended to diagnose MRSA infection nor to guide or monitor treatment  for MRSA infections.   Blood culture (routine x 2)     Status: None (Preliminary result)   Collection Time: 02/05/16  4:00 AM  Result Value Ref Range Status   Specimen Description BLOOD LEFT ARM  Final   Special Requests BOTTLES DRAWN AEROBIC ONLY 6 CC  Final   Culture   Final    NO GROWTH 2 DAYS Performed at [facility]    Report Status PENDING  Incomplete  Culture, Urine     Status: None   Collection Time: 02/05/16  4:30 AM  Result Value Ref Range Status   Specimen Description URINE, RANDOM  Final   Special Requests NONE  Final   Culture NO GROWTH Performed at [facility]   Final   Report Status 02/06/2016 FINAL  Final         Radiology Studies: Dg Abd 1 View  Result Date: 02/06/2016 CLINICAL DATA:  New onset vomiting and mid abdominal pain with bloating EXAM: ABDOMEN - 1 VIEW COMPARISON:  CT 02/04/2016 FINDINGS: Small left-sided pleural effusion. Bibasilar atelectasis or infiltrates. Moderate air distention of the stomach. Bowel gas pattern nonspecific. Moderate stool right colon. Status post right hip replacement. IMPRESSION: 1. Small left-sided pleural effusion with bibasilar infiltrates. 2. Moderate air distention of the stomach. Overall gas pattern nonspecific. Electronically Signed   By: Donavan Foil M.D.   On: 02/06/2016 03:40   Ct Abdomen Pelvis W Contrast  Result Date: 02/06/2016 CLINICAL DATA:   Sepsis. Community acquired pneumonia. Acute respiratory failure. Nausea vomiting and abdominal pain. EXAM: CT ABDOMEN AND PELVIS WITH CONTRAST TECHNIQUE: Multidetector CT imaging of the abdomen and pelvis was performed using the standard protocol following bolus administration of intravenous contrast. CONTRAST:  134mL ISOVUE-300 IOPAMIDOL (ISOVUE-300) INJECTION 61% COMPARISON:  Abdominal radiograph 02/06/2016 FINDINGS: Lower chest: Bilateral medium-sized pleural effusions, left-greater-than-right, with associated atelectasis. Consolidative opacities within the lingula. Coronary artery calcifications are noted. Hepatobiliary: Normal hepatic size and contours without focal liver lesion. No perihepatic ascites. No intra- or extrahepatic biliary dilatation. Normal gallbladder. Pancreas: Senescent fatty atrophy of the pancreas. No pancreatic ductal dilatation or peripancreatic fluid collection. Spleen: Normal. Adrenals/Urinary Tract: Normal adrenal glands. No hydronephrosis or solid renal mass. Stomach/Bowel: There is a large stool ball within the rectum, measuring up to 7 cm. There is colonic interposition anterior to the liver. Despite review of multiplanar reformatted images in 3 orthogonal planes, the appendix could not be adequately visualized. However, there is no inflammatory stranding or free fluid within the right lower quadrant. There is no intra-abdominal fluid collection. No evidence of acute colonic or enteric inflammation. No portal venous gas. Vascular/Lymphatic: There is extensive atherosclerotic calcification of the abdominal aorta. Calcification extends into the iliac artery is. The major branches of the abdominal aorta are proximally patent. The portal vein, superior mesenteric vein and splenic vein are patent. No abdominal or pelvic adenopathy. Reproductive: Assessment of the prostate is limited by streak artifact from right hip prosthesis. Musculoskeletal: There is a right total hip arthroplasty.  There are bridging osteophytes at both sacroiliac joints. Multilevel severe facet arthrosis with narrowing of the spinal canal at L2-L3. Grade 1 L4-L5 anterolisthesis. No lytic or blastic lesions. Normal visualized extrathoracic and extraperitoneal soft tissues. Other: No contributory non-categorized findings. IMPRESSION: 1. Bilateral medium-sized pleural effusions with associated atelectasis. Consolidative opacity within the lingula, which could indicate pneumonia. 2. Large stool ball within the rectum without evidence of stercoral inflammation. 3. Aortic atherosclerosis. Electronically Signed   By: Cletus Gash.D.  On: 02/06/2016 18:00        Scheduled Meds: . atorvastatin  40 mg Oral q1800  . furosemide  20 mg Intravenous BID  . guaiFENesin  600 mg Oral BID  . ipratropium  0.5 mg Nebulization QID  . levalbuterol  0.63 mg Nebulization QID  . levofloxacin (LEVAQUIN) IV  750 mg Intravenous Q24H  . methylPREDNISolone (SOLU-MEDROL) injection  60 mg Intravenous Q8H  . polyethylene glycol  17 g Oral Daily  . senna-docusate  2 tablet Oral BID  . terazosin  2 mg Oral QHS   Continuous Infusions:    LOS: 2 days    Time spent: 30 minutes.     Hosie Poisson, MD [facility] Pager [phone]   If 7PM-7AM, please contact night-coverage [email or web address] Password [email or web address] 02/07/2016, 3:13 PM

## 2016-02-08 LAB — BASIC METABOLIC PANEL
Anion gap: 10 (ref 5–15)
BUN: 38 mg/dL — AB (ref 6–20)
CHLORIDE: 95 mmol/L — AB (ref 101–111)
CO2: 33 mmol/L — AB (ref 22–32)
CREATININE: 0.85 mg/dL (ref 0.61–1.24)
Calcium: 8.7 mg/dL — ABNORMAL LOW (ref 8.9–10.3)
GFR calc Af Amer: >60 mL/min (ref >60)
GFR calc non Af Amer: >60 mL/min (ref >60)
Glucose, Bld: 181 mg/dL — ABNORMAL HIGH (ref 65–99)
POTASSIUM: 3.9 mmol/L (ref 3.5–5.1)
SODIUM: 138 mmol/L (ref 135–145)

## 2016-02-08 MED ORDER — PREDNISONE 20 MG PO TABS
40.0000 mg | ORAL_TABLET | Freq: Every day | ORAL | Status: DC
  Administered 2016-02-09 – 2016-02-11 (×3): 40 mg via ORAL
  Filled 2016-02-08 (×2): qty 2

## 2016-02-08 NOTE — Evaluation (Signed)
Physical Therapy Evaluation Patient Details Name: Allen Beasley MRN: [id number] DOB: 04-Aug-1923 Today's Date: 02/08/2016   History of Present Illness  Allen Beasley is a 82 y.o. male with medical history significant of COPD/emphysema, HTN, HMD, aortic insufficiency; who presents with complaints of shortness of breath.  route with EMS patient was given 125mg  Solumedrol, 10mg  Albuterol, and a DuoNeb treatment with mild relief of symptoms. Upon admission 1-/25/17 to emergency department patient was evaluated and seen to be febrile up to 100.15F, heart rate is 93-114, respirations 19-38, O2 saturations as low as 78%, and blood pressure seen as high as 194/77. Small left side pleural effusion.  Clinical Impression  The patient very motivated. He lib=ves alone with no local family. Currently on high flow [place] with oxygen saturations  At 87-91%. Pt admitted with above diagnosis. Pt currently with functional limitations due to the deficits listed below (see PT Problem List).  Pt will benefit from skilled PT to increase their independence and safety with mobility to allow discharge to the venue listed below.       Follow Up Recommendations SNF;Supervision/Assistance - 24 hour    Equipment Recommendations  None recommended by PT    Recommendations for Other Services       Precautions / Restrictions Precautions Precautions: Fall Precaution Comments: on high flow oxygen at 16 liters. Restrictions Weight Bearing Restrictions: No      Mobility  Bed Mobility Overal bed mobility: Needs Assistance Bed Mobility: Supine to Sit     Supine to sit: Min guard     General bed mobility comments: extra time and use of rails, HOB levated.  Transfers Overall transfer level: Needs assistance Equipment used: Rolling walker (2 wheeled) Transfers: Sit to/from Stand Sit to Stand: Min assist;+2 safety/equipment         General transfer comment: extra time to power up to  stand  Ambulation/Gait Ambulation/Gait assistance: Min assist;+2 safety/equipment Ambulation Distance (Feet): 20 Feet Assistive device: Rolling walker (2 wheeled) Gait Pattern/deviations: Step-to pattern;Step-through pattern     General Gait Details: generally steady on his feet., Assist mostly for lines . On 16 liters HF [place]. sats 87-91. RR 40 after  ambualtion.  Stairs            Wheelchair Mobility    Modified Rankin (Stroke Patients Only)       Balance Overall balance assessment: Needs assistance         Standing balance support: During functional activity;Bilateral upper extremity supported Standing balance-Leahy Scale: Fair                               Pertinent Vitals/Pain Pain Assessment: No/denies pain    Home Living Family/patient expects to be discharged to:: Private residence Living Arrangements: Alone   Type of Home: House Home Access: Stairs to enter Entrance Stairs-Rails: Psychiatric nurse of Steps: 4 Home Layout: One level Home Equipment: None      Prior Function Level of Independence: Independent               Hand Dominance        Extremity/Trunk Assessment   Upper Extremity Assessment: Generalized weakness           Lower Extremity Assessment: Generalized weakness      Cervical / Trunk Assessment: Kyphotic  Communication   Communication: No difficulties  Cognition Arousal/Alertness: Awake/alert Behavior During Therapy: WFL for tasks assessed/performed Overall Cognitive Status: Within Functional Limits  for tasks assessed                      General Comments      Exercises     Assessment/Plan    PT Assessment Patient needs continued PT services  PT Problem List Decreased strength;Decreased activity tolerance;Decreased mobility;Cardiopulmonary status limiting activity;Decreased knowledge of precautions;Decreased safety awareness;Decreased knowledge of use of DME           PT Treatment Interventions DME instruction;Gait training;Functional mobility training;Therapeutic activities;Therapeutic exercise;Patient/family education    PT Goals (Current goals can be found in the Care Plan section)  Acute Rehab PT Goals Patient Stated Goal: to be able to get around PT Goal Formulation: With patient Time For Goal Achievement: 02/22/16 Potential to Achieve Goals: Good    Frequency Min 3X/week   Barriers to discharge Decreased caregiver support patient has no family available    Co-evaluation               End of Session Equipment Utilized During Treatment: Oxygen Activity Tolerance: Patient tolerated treatment well Patient left: in chair;with call bell/phone within reach;with chair alarm set Nurse Communication: Mobility status         Time: 1104-1140 PT Time Calculation (min) (ACUTE ONLY): 36 min   Charges:   PT Evaluation $PT Eval Moderate Complexity: 1 Procedure PT Treatments $Gait Training: 8-22 mins   PT G Codes:        Claretha Cooper 02/08/2016, 11:54 AM Tresa Endo PT [phone]

## 2016-02-08 NOTE — Progress Notes (Addendum)
PROGRESS NOTE    Allen Beasley  [id number] DOB: [birth year] DOA: 02/04/2016 PCP: Geoffery Lyons, MD    Brief Narrative: 82 year old gentleman admitted for sepsis from community acquired pneumonia and acute respiratory failure with hypoxia from copd exacerbation. He was started on IV steroids, IV antibiotics and admitted to step down for futher evaluation.   Assessment & Plan:   Principal Problem:   Sepsis ([place]) Active Problems:   CAP (community acquired pneumonia)   Acute respiratory failure with hypoxia ([place])   BPH (benign prostatic hyperplasia)   Hyperglycemia   Sepsis from community acquired pneumonia:  - improved.  - persistent leukocytosis, though afebrile, probably from the steroids.  - lactic acid normalized, and pro calcitonin elevated.  - CT abdomen and pelvis ordered for coffee ground emesis, which showed constipation, and moderate bilateral pleural effusions and lingular density,  no other abnormalities.   Acute respiratory failure with hypoxia probably from a combination of copd exacerbation and community acquired pneumonia.  Started on IV steroids, levaquin. Urine for legionella and strep pneumonia antigen negative. Started tapering steroids as his wheezing has improved and would resume levaquin.  Blood cultures are negative so far.  [place] oxygen as needed to keep sats greater than 90%, currently at 12 liters.    Pt reports some difficulty swallowing to solids, will get SLP eval for further evaluation.    Hypertension: controlled.    BPH: on hytrin.    Constipation:  MOM helped resolve. 3 BM last night.   Moderate bilateral pleural effusions: Started him on lasix for diuresis, increased the dose of lasix to 40 mg bID.  Watch his creatinine and potassium while on lasix.    Hematuria: lovenox stopped and on SCD'S for DVT prophylaxis, once it clears up , will restart the lovenox.  Unclear etiology. No hydronephrosis on CT abd and plevis, no UTI and he  denies any dysuria. Urine cultures have been negative.   Social:  Pt lives by himself and apparently took himself off his oxygen,.  Family wants him to go to ALF.  Palliative care consulted for goals of care.  Case manager consult for home health needs.  PT recommended SNF, he is currently resistant to the idea of going to rehab.   DVT prophylaxis: scd's Code Status: full code.  Family Communication: none at bedside, discussed the plan of care with the patient,  Disposition Plan: SNF, when we can wean him down to 2 lit of [place] oxygen.    Consultants:   Palliative care consult.    Procedures:  None.    Antimicrobials: one dose of vancomycin and aztreonam.   levaquin from 10/26   Subjective: No vomiting, nausea, one BM yesterday.    Objective: Vitals:   02/08/16 1241 02/08/16 1300 02/08/16 1400 02/08/16 1500  BP:   (!) 156/93 132/73  Pulse: 88 83 (!) 102 92  Resp: 17 (!) 25 (!) 26 (!) 29  Temp:      TempSrc:      SpO2: 95% 93% (!) 87% 95%  Weight:      Height:        Intake/Output Summary (Last 24 hours) at 02/08/16 1536 Last data filed at 02/08/16 1500  Gross per 24 hour  Intake                0 ml  Output             1500 ml  Net            -  1500 ml   Filed Weights   02/05/16 0215 02/05/16 0500  Weight: 84.4 kg (186 lb 1.1 oz) 84.4 kg (186 lb 1.1 oz)    Examination:  General exam: Appears comfortable still on high flow oxygen.  Respiratory system: scattered wheezing. Much improved.  Cardiovascular system: S1 & S2 heard, RRR. No JVD, murmurs, rubs, gallops or clicks. No pedal edema. Gastrointestinal system: Abdomen is nondistended, soft and nontender. No organomegaly or masses felt. Normal bowel sounds heard. Central nervous system: Alert and oriented. No focal neurological deficits. Extremities: Symmetric 5 x 5 power. Skin: No rashes, lesions or ulcers Psychiatry: Judgement and insight appear normal. Mood & affect appropriate.     Data Reviewed: I  have personally reviewed following labs and imaging studies  CBC:  Recent Labs Lab 02/04/16 2146 02/05/16 0400 02/06/16 0358 02/07/16 1615  WBC 13.6* 17.5* 17.6* 18.4*  NEUTROABS 10.3*  --   --  16.6*  HGB 14.6 13.1 12.5* 14.0  HCT 46.1 41.2 39.3 43.5  MCV 92.0 90.9 90.6 90.8  PLT 222 203 223 [id number]   Basic Metabolic Panel:  Recent Labs Lab 02/04/16 2146 02/06/16 1754 02/07/16 1615 02/08/16 0332  NA 139 136 137 138  K 4.0 3.9 4.0 3.9  CL 101 101 95* 95*  CO2 27 27 31  33*  GLUCOSE 196* 159* 236* 181*  BUN 31* 33* 35* 38*  CREATININE 0.89 0.59* 0.77 0.85  CALCIUM 9.1 8.8* 8.9 8.7*   GFR: Estimated Creatinine Clearance: 61.5 mL/min (by C-G formula based on SCr of 0.85 mg/dL). Liver Function Tests:  Recent Labs Lab 02/04/16 2146  AST 23  ALT 13*  ALKPHOS 63  BILITOT 0.8  PROT 7.5  ALBUMIN 4.0    Recent Labs Lab 02/04/16 2146  LIPASE 19   No results for input(s): AMMONIA in the last 168 hours. Coagulation Profile: No results for input(s): INR, PROTIME in the last 168 hours. Cardiac Enzymes:  Recent Labs Lab 02/05/16 0400 02/05/16 1008 02/05/16 1601  TROPONINI <0.03 <0.03 <0.03   BNP (last 3 results) No results for input(s): PROBNP in the last 8760 hours. HbA1C: No results for input(s): HGBA1C in the last 72 hours. CBG: No results for input(s): GLUCAP in the last 168 hours. Lipid Profile: No results for input(s): CHOL, HDL, LDLCALC, TRIG, CHOLHDL, LDLDIRECT in the last 72 hours. Thyroid Function Tests: No results for input(s): TSH, T4TOTAL, FREET4, T3FREE, THYROIDAB in the last 72 hours. Anemia Panel: No results for input(s): VITAMINB12, FOLATE, FERRITIN, TIBC, IRON, RETICCTPCT in the last 72 hours. Sepsis Labs:  Recent Labs Lab 02/04/16 2146 02/04/16 2202 02/05/16 0044 02/05/16 0400  PROCALCITON  --   --   --  3.14  LATICACIDVEN 2.7* 3.22* 1.7 1.7    Recent Results (from the past 240 hour(s))  Urine culture     Status: Abnormal    Collection Time: 02/04/16  9:39 PM  Result Value Ref Range Status   Specimen Description URINE, CLEAN CATCH  Final   Special Requests NONE  Final   Culture MULTIPLE SPECIES PRESENT, SUGGEST RECOLLECTION (A)  Final   Report Status 02/06/2016 FINAL  Final  Blood culture (routine x 2)     Status: None (Preliminary result)   Collection Time: 02/04/16  9:44 PM  Result Value Ref Range Status   Specimen Description BLOOD LEFT WRIST  Final   Special Requests BOTTLES DRAWN AEROBIC AND ANAEROBIC 5CC  Final   Culture   Final    NO GROWTH 4 DAYS Performed at  [facility]    Report Status PENDING  Incomplete  MRSA PCR Screening     Status: None   Collection Time: 02/05/16  2:40 AM  Result Value Ref Range Status   MRSA by PCR NEGATIVE NEGATIVE Final    Comment:        The GeneXpert MRSA Assay (FDA approved for NASAL specimens only), is one component of a comprehensive MRSA colonization surveillance program. It is not intended to diagnose MRSA infection nor to guide or monitor treatment for MRSA infections.   Blood culture (routine x 2)     Status: None (Preliminary result)   Collection Time: 02/05/16  4:00 AM  Result Value Ref Range Status   Specimen Description BLOOD LEFT ARM  Final   Special Requests BOTTLES DRAWN AEROBIC ONLY 6 CC  Final   Culture   Final    NO GROWTH 3 DAYS Performed at [facility]    Report Status PENDING  Incomplete  Culture, Urine     Status: None   Collection Time: 02/05/16  4:30 AM  Result Value Ref Range Status   Specimen Description URINE, RANDOM  Final   Special Requests NONE  Final   Culture NO GROWTH Performed at [facility]   Final   Report Status 02/06/2016 FINAL  Final         Radiology Studies: Ct Abdomen Pelvis W Contrast  Result Date: 02/06/2016 CLINICAL DATA:  Sepsis. Community acquired pneumonia. Acute respiratory failure. Nausea vomiting and abdominal pain. EXAM: CT ABDOMEN AND PELVIS WITH CONTRAST TECHNIQUE:  Multidetector CT imaging of the abdomen and pelvis was performed using the standard protocol following bolus administration of intravenous contrast. CONTRAST:  154mL ISOVUE-300 IOPAMIDOL (ISOVUE-300) INJECTION 61% COMPARISON:  Abdominal radiograph 02/06/2016 FINDINGS: Lower chest: Bilateral medium-sized pleural effusions, left-greater-than-right, with associated atelectasis. Consolidative opacities within the lingula. Coronary artery calcifications are noted. Hepatobiliary: Normal hepatic size and contours without focal liver lesion. No perihepatic ascites. No intra- or extrahepatic biliary dilatation. Normal gallbladder. Pancreas: Senescent fatty atrophy of the pancreas. No pancreatic ductal dilatation or peripancreatic fluid collection. Spleen: Normal. Adrenals/Urinary Tract: Normal adrenal glands. No hydronephrosis or solid renal mass. Stomach/Bowel: There is a large stool ball within the rectum, measuring up to 7 cm. There is colonic interposition anterior to the liver. Despite review of multiplanar reformatted images in 3 orthogonal planes, the appendix could not be adequately visualized. However, there is no inflammatory stranding or free fluid within the right lower quadrant. There is no intra-abdominal fluid collection. No evidence of acute colonic or enteric inflammation. No portal venous gas. Vascular/Lymphatic: There is extensive atherosclerotic calcification of the abdominal aorta. Calcification extends into the iliac artery is. The major branches of the abdominal aorta are proximally patent. The portal vein, superior mesenteric vein and splenic vein are patent. No abdominal or pelvic adenopathy. Reproductive: Assessment of the prostate is limited by streak artifact from right hip prosthesis. Musculoskeletal: There is a right total hip arthroplasty. There are bridging osteophytes at both sacroiliac joints. Multilevel severe facet arthrosis with narrowing of the spinal canal at L2-L3. Grade 1 L4-L5  anterolisthesis. No lytic or blastic lesions. Normal visualized extrathoracic and extraperitoneal soft tissues. Other: No contributory non-categorized findings. IMPRESSION: 1. Bilateral medium-sized pleural effusions with associated atelectasis. Consolidative opacity within the lingula, which could indicate pneumonia. 2. Large stool ball within the rectum without evidence of stercoral inflammation. 3. Aortic atherosclerosis. Electronically Signed   By: Ulyses Jarred M.D.   On: 02/06/2016 18:00  Scheduled Meds: . atorvastatin  40 mg Oral q1800  . furosemide  40 mg Intravenous BID  . guaiFENesin  600 mg Oral BID  . ipratropium  0.5 mg Nebulization QID  . levalbuterol  0.63 mg Nebulization QID  . levofloxacin (LEVAQUIN) IV  750 mg Intravenous Q24H  . methylPREDNISolone (SOLU-MEDROL) injection  40 mg Intravenous Q12H  . polyethylene glycol  17 g Oral Daily  . senna-docusate  2 tablet Oral BID  . terazosin  2 mg Oral QHS   Continuous Infusions:    LOS: 3 days    Time spent: 30 minutes.     Hosie Poisson, MD [facility] Pager [phone]   If 7PM-7AM, please contact night-coverage [email or web address] Password [email or web address] 02/08/2016, 3:36 PM

## 2016-02-09 ENCOUNTER — Inpatient Hospital Stay (HOSPITAL_COMMUNITY): Payer: Medicare Other

## 2016-02-09 ENCOUNTER — Encounter (HOSPITAL_COMMUNITY): Payer: Self-pay

## 2016-02-09 DIAGNOSIS — Z515 Encounter for palliative care: Secondary | ICD-10-CM

## 2016-02-09 DIAGNOSIS — R0600 Dyspnea, unspecified: Secondary | ICD-10-CM

## 2016-02-09 DIAGNOSIS — Z7189 Other specified counseling: Secondary | ICD-10-CM

## 2016-02-09 DIAGNOSIS — J9621 Acute and chronic respiratory failure with hypoxia: Secondary | ICD-10-CM

## 2016-02-09 LAB — BASIC METABOLIC PANEL
ANION GAP: 10 (ref 5–15)
BUN: 41 mg/dL — ABNORMAL HIGH (ref 6–20)
CALCIUM: 8.5 mg/dL — AB (ref 8.9–10.3)
CO2: 32 mmol/L (ref 22–32)
Chloride: 94 mmol/L — ABNORMAL LOW (ref 101–111)
Creatinine, Ser: 0.8 mg/dL (ref 0.61–1.24)
Glucose, Bld: 148 mg/dL — ABNORMAL HIGH (ref 65–99)
Potassium: 4.1 mmol/L (ref 3.5–5.1)
SODIUM: 136 mmol/L (ref 135–145)

## 2016-02-09 LAB — BLOOD GAS, ARTERIAL
ACID-BASE EXCESS: 11.8 mmol/L — AB (ref 0.0–2.0)
BICARBONATE: 37.4 mmol/L — AB (ref 20.0–28.0)
DRAWN BY: 257701
O2 Content: 8 L/min
O2 Saturation: 88.6 %
PCO2 ART: 50.7 mmHg — AB (ref 32.0–48.0)
PH ART: 7.48 — AB (ref 7.350–7.450)
PO2 ART: 56.6 mmHg — AB (ref 83.0–108.0)
Patient temperature: 37

## 2016-02-09 LAB — D-DIMER, QUANTITATIVE: D-Dimer, Quant: 4.74 ug/mL-FEU — ABNORMAL HIGH (ref 0.00–0.50)

## 2016-02-09 LAB — CULTURE, BLOOD (ROUTINE X 2): CULTURE: NO GROWTH

## 2016-02-09 LAB — ECHOCARDIOGRAM COMPLETE
HEIGHTINCHES: 71 in
WEIGHTICAEL: 2977.09 [oz_av]

## 2016-02-09 MED ORDER — SUCRALFATE 1 GM/10ML PO SUSP
1.0000 g | Freq: Three times a day (TID) | ORAL | Status: DC
  Administered 2016-02-09 – 2016-02-17 (×31): 1 g via ORAL
  Filled 2016-02-09 (×31): qty 10

## 2016-02-09 MED ORDER — FUROSEMIDE 40 MG PO TABS
40.0000 mg | ORAL_TABLET | Freq: Every day | ORAL | Status: DC
  Administered 2016-02-10 – 2016-02-15 (×6): 40 mg via ORAL
  Filled 2016-02-09 (×6): qty 1

## 2016-02-09 MED ORDER — TIOTROPIUM BROMIDE MONOHYDRATE 18 MCG IN CAPS
18.0000 ug | ORAL_CAPSULE | Freq: Every day | RESPIRATORY_TRACT | Status: DC
  Administered 2016-02-09 – 2016-02-17 (×9): 18 ug via RESPIRATORY_TRACT
  Filled 2016-02-09 (×2): qty 5

## 2016-02-09 MED ORDER — IOPAMIDOL (ISOVUE-370) INJECTION 76%
100.0000 mL | Freq: Once | INTRAVENOUS | Status: AC | PRN
  Administered 2016-02-09: 100 mL via INTRAVENOUS

## 2016-02-09 MED ORDER — LEVALBUTEROL HCL 0.63 MG/3ML IN NEBU
0.6300 mg | INHALATION_SOLUTION | Freq: Two times a day (BID) | RESPIRATORY_TRACT | Status: DC
  Administered 2016-02-10 – 2016-02-17 (×14): 0.63 mg via RESPIRATORY_TRACT
  Filled 2016-02-09 (×14): qty 3

## 2016-02-09 MED ORDER — LEVALBUTEROL HCL 0.63 MG/3ML IN NEBU: 0.6300 mg | INHALATION_SOLUTION | Freq: Three times a day (TID) | RESPIRATORY_TRACT | Status: DC

## 2016-02-09 MED ORDER — PANTOPRAZOLE SODIUM 40 MG PO TBEC
40.0000 mg | DELAYED_RELEASE_TABLET | Freq: Every day | ORAL | Status: DC
  Administered 2016-02-09 – 2016-02-17 (×9): 40 mg via ORAL
  Filled 2016-02-09 (×10): qty 1

## 2016-02-09 NOTE — Consult Note (Signed)
Consultation Note Date: 02/09/2016   Patient Name: Allen Beasley  DOB: 1923-05-02  MRN: [id number]  Age / Sex: 82 y.o., male  PCP: Burnard Bunting, MD Referring Physician: Hosie Poisson, MD  Reason for Consultation: Establishing goals of care, Non pain symptom management and Psychosocial/spiritual support  HPI/Patient Profile: 82 y.o. male  with past medical history of COPD, aortic insufficiency, BPH, and HTN who was  admitted on 02/04/2016 with sepsis and COPD exacerbation with acute respiratory failure with hypoxia, both secondary to community acquired pneumonia. He was started on steroid and antibiotics, with subsequent improvement in his symptoms.   Clinical Assessment and Goals of Care: Allen Beasley feels better from a respiratory standpoint; he notes improved ease of breathing with resolving breathlessness and improved movement tolerance. He is, however, now endorsing an odynophagia with rare intermittent swallowing difficulty.   Overall, pt is clear on his goals: return home, and move to [place] in a few months to be closer to family. He understands that his COPD is not curable and will continue to progress, and he plans to rely on family support to promote autonomy and independence for as long as possible.   Primary Decision Maker PATIENT; pt also reports that his sons will cooperatively make decisions for him if he is unable to.   SUMMARY OF RECOMMENDATIONS    Respiratory -Continue care per primary team. Allen Beasley is very confident in his capacity to manage his symptoms, and would likely benefit from ongoing conversations about medication choice--especially if a medication is chosen that is different from his home regimen. Partnering with him to discuss medications will improve compliance and reduce his frustration.  Odynophagia; likely secondary to GERD esophagitis -Already evaluated by speech and  cleared for regular diet with limited acidic items -Continue Carafate and Protonix  Goals of care/Disposition -Pt is focused on maintaining independence and spending time with family. He is planning to relocate to [place] to be closer to family once his health stabilizes. In the interim, his niece will arrive on Thursday and provide 24 hour assistance after discharge for as long as a month. Palliative will plan to meet niece when she arrives on Thursday to touch base. -Touched on code status very briefly; brought up the possibility that, eventually, our interventions may be more detrimental than beneficial to his primary goal of quality time with his family, and that not doing aggressive interventions may be a more appropriate option. He was open to the conversation, though I doubt he will make any code status changes at present.   Code Status/Advance Care Planning:  Full code  Palliative Prophylaxis:   Bowel Regimen  Additional Recommendations (Limitations, Scope, Preferences):  None  Psycho-social/Spiritual:   Desire for further Chaplaincy support:Did not address during our visit  Additional Recommendations: Caregiving  Support/Resources-->will explore needs with niece when she arrives on Thursday.  Prognosis:   Unable to determine  Discharge Planning: Home with 24 hours support provided by niece.      Primary Diagnoses: Present on Admission: . CAP (community  acquired pneumonia) . Sepsis ([place]) . Acute respiratory failure with hypoxia ([place]) . BPH (benign prostatic hyperplasia) . Hyperglycemia   I have reviewed the medical record, interviewed the patient, and examined the patient. The following aspects are pertinent.  Past Medical History:  Diagnosis Date  . COPD (chronic obstructive pulmonary disease) ([place])   . Erectile dysfunction   . History of aortic insufficiency   . History of chest pain   . Hyperlipidemia   . Hypertension    Social History   Social History    . Marital status: Widowed    Spouse name: N/A  . Number of children: N/A  . Years of education: N/A   Social History Main Topics  . Smoking status: Former Smoker    Packs/day: 1.00    Years: 59.00    Types: Cigarettes    Quit date: 08/04/2001  . Smokeless tobacco: Never Used  . Alcohol use Yes     Comment: patient admits to alcohol unknown amout  . Drug use: Unknown  . Sexual activity: No   Other Topics Concern  . None   Social History Narrative   Has one child 21yrs alive and well   Family History  Problem Relation Age of Onset  . Cancer Father   . Heart disease Father   . Heart attack Father    Scheduled Meds: . atorvastatin  40 mg Oral q1800  . furosemide  40 mg Intravenous BID  . guaiFENesin  600 mg Oral BID  . [START ON 02/10/2016] levalbuterol  0.63 mg Nebulization BID  . levofloxacin (LEVAQUIN) IV  750 mg Intravenous Q24H  . pantoprazole  40 mg Oral Q0600  . polyethylene glycol  17 g Oral Daily  . predniSONE  40 mg Oral QAC breakfast  . senna-docusate  2 tablet Oral BID  . sucralfate  1 g Oral TID WC & HS  . terazosin  2 mg Oral QHS  . tiotropium  18 mcg Inhalation Daily   Continuous Infusions:  PRN Meds:.acetaminophen **OR** acetaminophen, iopamidol, levalbuterol, magnesium hydroxide, ondansetron **OR** ondansetron (ZOFRAN) IV Allergies  Allergen Reactions  . Penicillins Hives and Swelling   Review of Systems  Constitutional: Positive for activity change (Improving with respiratory improvement) and fatigue.  HENT: Positive for congestion and trouble swallowing (and odynophagia).   Respiratory: Positive for shortness of breath (improving). Negative for chest tightness and wheezing.   Gastrointestinal: Negative for abdominal pain.    Physical Exam  Constitutional: He is oriented to person, place, and time. He appears well-developed and well-nourished. No distress.  HENT:  Head: Normocephalic and atraumatic.  Mouth/Throat: Oropharynx is clear and  moist.  Eyes: EOM are normal.  Neck: Normal range of motion.  Pulmonary/Chest:  Increased effort with prolonged conversation, noted to de-satruate to 85% with conversation.  Musculoskeletal: Normal range of motion.  Neurological: He is alert and oriented to person, place, and time.  Skin: Skin is warm and dry.  Psychiatric: He has a normal mood and affect. His behavior is normal. Judgment and thought content normal.    Vital Signs: BP 132/71 Comment: 134/90 following ambulation.  Pulse 97 Comment: 106 bpm following ambulation.   Temp 98.8 F (37.1 C)   Resp (!) 22   Ht 5\' 11"  (1.803 m)   Wt 84.4 kg (186 lb 1.1 oz)   SpO2 90% Comment: O2 dropped to 84% during ambulation. Following ambulation increased to 91%.   BMI 25.95 kg/m  Pain Assessment: No/denies pain   Pain Score: 0-No pain  SpO2: SpO2: 90 % (O2 dropped to 84% during ambulation. Following ambulation increased to 91%. ) O2 Device:SpO2: 90 % (O2 dropped to 84% during ambulation. Following ambulation increased to 91%. ) O2 Flow Rate: .O2 Flow Rate (L/min): 8 L/min  IO: Intake/output summary:  Intake/Output Summary (Last 24 hours) at 02/09/16 1443 Last data filed at 02/09/16 0900  Gross per 24 hour  Intake                0 ml  Output             1145 ml  Net            -1145 ml    LBM: Last BM Date: 02/07/16 Baseline Weight: Weight: 84.4 kg (186 lb 1.1 oz) Most recent weight: Weight: 84.4 kg (186 lb 1.1 oz)     Palliative Assessment/Data:  PPS: 60%, would expect this to improve as his acute respiratory issues resolve. Previously fully independent at home.     Time In: 1235 Time Out: 1310 Time Total: 35 Greater than 50%  of this time was spent counseling and coordinating care related to the above assessment and plan.  Signed by: Charlynn Court, NP Palliative Medicine Team Team Phone # [phone] (Nights/Weekends)

## 2016-02-09 NOTE — Evaluation (Signed)
Clinical/Bedside Swallow Evaluation Patient Details  Name: MARTESE HADERLIE MRN: [id number] Date of Birth: Jan 19, 1924  Today's Date: 02/09/2016 Time: SLP Start Time (ACUTE ONLY): [id number] SLP Stop Time (ACUTE ONLY): 1202 SLP Time Calculation (min) (ACUTE ONLY): 36 min  Past Medical History:  Past Medical History:  Diagnosis Date  . COPD (chronic obstructive pulmonary disease) ([place])   . Erectile dysfunction   . History of aortic insufficiency   . History of chest pain   . Hyperlipidemia   . Hypertension    Past Surgical History:  Past Surgical History:  Procedure Laterality Date  . CATARACT EXTRACTION, BILATERAL  2001  . JOINT REPLACEMENT    . LITHOTRIPSY  2001  . TONSILECTOMY, ADENOIDECTOMY, BILATERAL MYRINGOTOMY AND TUBES  1932  . TOTAL HIP ARTHROPLASTY  07   right   HPI:  82 yo male adm [facility] 10/25 with hypoxia, CAP.  Pt with nausea/vomiting and abdomen pain prior to admission.  Imaging studies show pleural effusion, left more than right.  Pt reports difficulty with solids.     Assessment / Plan / Recommendation Clinical Impression  Pt presents with symptoms of odynophagia and esopahgeal deficits.  His CN exam was unremarkable for oropharyngeal swallow ability.  Pt symptoms include refluxing, pain/burning with swallow and pineapple lodging in esophagus.  Trials of water, Ensure were tolerated better - however pt continues with frequent belching.  Wet voice noted which pt states is NOT baseline = ? if secretion retention possible due to esophageal issues/refluxing from recent n/v prior to admission.   Advised RN to concerns for odynophagia and she subsequently contacted MD who provided pt with medication order.     SLP Advised pt to consider room temperature non acidic items for maximal comfort.  Pt is aware and uses caution with intake.   Will follow up x1 to assure mitigation in place.  Thanks for this consult.     Aspiration Risk  Mild aspiration risk;Moderate aspiration risk     Diet Recommendation Thin liquid;Regular (NO ACIDIC items)   Liquid Administration via: Cup;Straw Medication Administration: Other (Comment) (defer to pt) Supervision: Patient able to self feed Compensations: Slow rate;Small sips/bites Postural Changes: Seated upright at 90 degrees;Remain upright for at least 30 minutes after po intake    Other  Recommendations Oral Care Recommendations: Oral care BID   Follow up Recommendations        Frequency and Duration min 1 x/week  1 week       Prognosis Prognosis for Safe Diet Advancement: Good      Swallow Study   General Date of Onset: 02/09/16 HPI: 82 yo male adm to [facility] 10/25 with hypoxia, CAP.  Pt with nausea/vomiting and abdomen pain prior to admission.  Imaging studies show pleural effusion, left more than right.  Pt reports difficulty with solids.   Type of Study: Bedside Swallow Evaluation Diet Prior to this Study: Regular;Thin liquids Temperature Spikes Noted: No Respiratory Status: Nasal cannula History of Recent Intubation: No Behavior/Cognition: Alert;Cooperative;Pleasant mood Oral Cavity Assessment: Erythema Oral Care Completed by SLP: No Oral Cavity - Dentition: Poor condition;Missing dentition Vision: Functional for self-feeding Self-Feeding Abilities: Able to feed self Patient Positioning: Upright in bed Baseline Vocal Quality: Wet;Breathy;Hoarse;Low vocal intensity;Suspected CN X (Vagus) involvement Volitional Cough: Congested Volitional Swallow: Able to elicit    Oral/Motor/Sensory Function Overall Oral Motor/Sensory Function: Within functional limits   Ice Chips     Thin Liquid Thin Liquid: Within functional limits Presentation: Cup;Self Fed    Nectar Thick  Nectar Thick Liquid: Within functional limits Presentation: Self Fed;Straw   Honey Thick Honey Thick Liquid: Not tested   Puree Puree: Within functional limits Presentation: Self Fed;Spoon   Solid   GO   Solid: Within functional  limits Presentation: [place], [facility] SLP [phone]

## 2016-02-09 NOTE — Progress Notes (Signed)
RT discussed with PT that neb treatments would include Spiriva QD and Xopenex TID (based on most recent Assessment Protocol). PT states he is willing to take Spiriva but would only agree to take Xopenex 2 times per day (BID). RT orders will reflect PT request.

## 2016-02-09 NOTE — Progress Notes (Signed)
PROGRESS NOTE    Allen Beasley  [id number] DOB: 1923-08-05 DOA: 02/04/2016 PCP: Geoffery Lyons, MD    Brief Narrative: 82 year old gentleman admitted for sepsis from community acquired pneumonia and acute respiratory failure with hypoxia from copd exacerbation. He was started on IV steroids, IV antibiotics and admitted to step down for futher evaluation.   Assessment & Plan:   Principal Problem:   Sepsis ([place]) Active Problems:   CAP (community acquired pneumonia)   Acute respiratory failure with hypoxia ([place])   BPH (benign prostatic hyperplasia)   Hyperglycemia   Sepsis from community acquired pneumonia:  - improved.  - persistent leukocytosis, though afebrile, probably from the steroids.  - lactic acid normalized, and pro calcitonin elevated.  - CT abdomen and pelvis ordered for coffee ground emesis, which showed constipation, and moderate bilateral pleural effusions and lingular density,  no other abnormalities.   Acute respiratory failure with hypoxia probably from a combination of copd exacerbation and community acquired pneumonia.  Started on IV steroids, levaquin. Urine for legionella and strep pneumonia antigen negative. Started tapering steroids as his wheezing has improved and would resume levaquin to complete the course. Blood cultures are negative so far.  [place] oxygen as needed to keep sats greater than 90%, currently at 8 to 10 liters.  Unable to wean him down from 8 liters, ABG ordered for further evaluation.    Pt reports some difficulty swallowing to solids, SLP evaluation recommended  Component of esophagitis ? GERD, started him on protonix and sucralfate.    Hypertension: controlled.    BPH: on hytrin.    Constipation:  MOM helped resolve.   Moderate bilateral pleural effusions: Started him on lasix for diuresis, increased the dose of lasix to 40 mg bID.  Watch his creatinine and potassium while on lasix.  Diuresed about 5 liters since admission.  Echocardiogram ordered today.    Hematuria: lovenox stopped and on SCD'S for DVT prophylaxis, once it clears up , will restart the lovenox.  Unclear etiology. No hydronephrosis on CT abd and plevis, no UTI and he denies any dysuria. Urine cultures have been negative.   Social:  Pt lives by himself and apparently took himself off his oxygen,.  Family wants him to go to ALF.  Palliative care consulted for goals of care, recommendations to continue the same treatment.  Case manager consult for home health needs.  PT recommended SNF, he is currently resistant to the idea of going to rehab. He wants to go home and her niece from [place] is coming to help him.   DVT prophylaxis: scd's Code Status: full code.  Family Communication: none at bedside, discussed the plan of care with the patient,  Disposition Plan: home with home PT when we are able to wean his oxygen to his home dose.    Consultants:   Palliative care consult.    Procedures:  None.    Antimicrobials: one dose of vancomycin and aztreonam.   levaquin from 10/26   Subjective: No vomiting, nausea, one BM yesterday.    Objective: Vitals:   02/09/16 0801 02/09/16 1000 02/09/16 1200 02/09/16 1256  BP:  99/65  132/71  Pulse:  (!) 102  97  Resp:  (!) 22    Temp: 98.7 F (37.1 C)  98.8 F (37.1 C)   TempSrc:      SpO2:  (!) 84%  90%  Weight:      Height:        Intake/Output Summary (Last 24  hours) at 02/09/16 1348 Last data filed at 02/09/16 0900  Gross per 24 hour  Intake                0 ml  Output             1145 ml  Net            -1145 ml   Filed Weights   02/05/16 0215 02/05/16 0500  Weight: 84.4 kg (186 lb 1.1 oz) 84.4 kg (186 lb 1.1 oz)    Examination:  General exam: Appears comfortable still on 10 lit of [place] oxygen Respiratory system: no wheezing, air entry better.  Cardiovascular system: S1 & S2 heard, RRR. No JVD, murmurs, rubs, gallops or clicks. No pedal edema. Gastrointestinal system:  Abdomen is nondistended, soft and nontender. No organomegaly or masses felt. Normal bowel sounds heard. Central nervous system: Alert and oriented. No focal neurological deficits. Extremities: Symmetric 5 x 5 power. Skin: No rashes, lesions or ulcers Psychiatry: Judgement and insight appear normal. Mood & affect appropriate.     Data Reviewed: I have personally reviewed following labs and imaging studies  CBC:  Recent Labs Lab 02/04/16 2146 02/05/16 0400 02/06/16 0358 02/07/16 1615  WBC 13.6* 17.5* 17.6* 18.4*  NEUTROABS 10.3*  --   --  16.6*  HGB 14.6 13.1 12.5* 14.0  HCT 46.1 41.2 39.3 43.5  MCV 92.0 90.9 90.6 90.8  PLT 222 203 223 [id number]   Basic Metabolic Panel:  Recent Labs Lab 02/04/16 2146 02/06/16 1754 02/07/16 1615 02/08/16 0332 02/09/16 0329  NA 139 136 137 138 136  K 4.0 3.9 4.0 3.9 4.1  CL 101 101 95* 95* 94*  CO2 27 27 31  33* 32  GLUCOSE 196* 159* 236* 181* 148*  BUN 31* 33* 35* 38* 41*  CREATININE 0.89 0.59* 0.77 0.85 0.80  CALCIUM 9.1 8.8* 8.9 8.7* 8.5*   GFR: Estimated Creatinine Clearance: 65.4 mL/min (by C-G formula based on SCr of 0.8 mg/dL). Liver Function Tests:  Recent Labs Lab 02/04/16 2146  AST 23  ALT 13*  ALKPHOS 63  BILITOT 0.8  PROT 7.5  ALBUMIN 4.0    Recent Labs Lab 02/04/16 2146  LIPASE 19   No results for input(s): AMMONIA in the last 168 hours. Coagulation Profile: No results for input(s): INR, PROTIME in the last 168 hours. Cardiac Enzymes:  Recent Labs Lab 02/05/16 0400 02/05/16 1008 02/05/16 1601  TROPONINI <0.03 <0.03 <0.03   BNP (last 3 results) No results for input(s): PROBNP in the last 8760 hours. HbA1C: No results for input(s): HGBA1C in the last 72 hours. CBG: No results for input(s): GLUCAP in the last 168 hours. Lipid Profile: No results for input(s): CHOL, HDL, LDLCALC, TRIG, CHOLHDL, LDLDIRECT in the last 72 hours. Thyroid Function Tests: No results for input(s): TSH, T4TOTAL, FREET4,  T3FREE, THYROIDAB in the last 72 hours. Anemia Panel: No results for input(s): VITAMINB12, FOLATE, FERRITIN, TIBC, IRON, RETICCTPCT in the last 72 hours. Sepsis Labs:  Recent Labs Lab 02/04/16 2146 02/04/16 2202 02/05/16 0044 02/05/16 0400  PROCALCITON  --   --   --  3.14  LATICACIDVEN 2.7* 3.22* 1.7 1.7    Recent Results (from the past 240 hour(s))  Urine culture     Status: Abnormal   Collection Time: 02/04/16  9:39 PM  Result Value Ref Range Status   Specimen Description URINE, CLEAN CATCH  Final   Special Requests NONE  Final   Culture MULTIPLE SPECIES PRESENT, SUGGEST RECOLLECTION (  A)  Final   Report Status 02/06/2016 FINAL  Final  Blood culture (routine x 2)     Status: None (Preliminary result)   Collection Time: 02/04/16  9:44 PM  Result Value Ref Range Status   Specimen Description BLOOD LEFT WRIST  Final   Special Requests BOTTLES DRAWN AEROBIC AND ANAEROBIC 5CC  Final   Culture   Final    NO GROWTH 4 DAYS Performed at [facility]    Report Status PENDING  Incomplete  MRSA PCR Screening     Status: None   Collection Time: 02/05/16  2:40 AM  Result Value Ref Range Status   MRSA by PCR NEGATIVE NEGATIVE Final    Comment:        The GeneXpert MRSA Assay (FDA approved for NASAL specimens only), is one component of a comprehensive MRSA colonization surveillance program. It is not intended to diagnose MRSA infection nor to guide or monitor treatment for MRSA infections.   Blood culture (routine x 2)     Status: None (Preliminary result)   Collection Time: 02/05/16  4:00 AM  Result Value Ref Range Status   Specimen Description BLOOD LEFT ARM  Final   Special Requests BOTTLES DRAWN AEROBIC ONLY 6 CC  Final   Culture   Final    NO GROWTH 3 DAYS Performed at [facility]    Report Status PENDING  Incomplete  Culture, Urine     Status: None   Collection Time: 02/05/16  4:30 AM  Result Value Ref Range Status   Specimen Description URINE,  RANDOM  Final   Special Requests NONE  Final   Culture NO GROWTH Performed at [facility]   Final   Report Status 02/06/2016 FINAL  Final         Radiology Studies: Dg Chest 2 View  Result Date: 02/09/2016 CLINICAL DATA:  Shortness of breath and wheezing. EXAM: CHEST  2 VIEW COMPARISON:  02/04/2016 and CT chest 06/19/2013. FINDINGS: Patient is rotated. Heart size stable. Thoracic aorta is calcified. Lungs are emphysematous. Airspace opacification is seen at the base of the left hemi thorax with a small left pleural effusion. Right lung is grossly clear. Tiny right pleural effusion. Degenerative changes are seen in the spine. IMPRESSION: 1. Left basilar airspace opacification may be due to pneumonia. Followup PA and lateral chest X-ray is recommended in 3-4 weeks following trial of antibiotic therapy to ensure resolution and exclude underlying malignancy. 2. Small bilateral pleural effusions, left greater than right. 3.  Aortic atherosclerosis (ICD10-170.0). Electronically Signed   By: Lorin Picket M.D.   On: 02/09/2016 08:58        Scheduled Meds: . atorvastatin  40 mg Oral q1800  . furosemide  40 mg Intravenous BID  . guaiFENesin  600 mg Oral BID  . [START ON 02/10/2016] levalbuterol  0.63 mg Nebulization BID  . levofloxacin (LEVAQUIN) IV  750 mg Intravenous Q24H  . pantoprazole  40 mg Oral Q0600  . polyethylene glycol  17 g Oral Daily  . predniSONE  40 mg Oral QAC breakfast  . senna-docusate  2 tablet Oral BID  . sucralfate  1 g Oral TID WC & HS  . terazosin  2 mg Oral QHS  . tiotropium  18 mcg Inhalation Daily   Continuous Infusions:    LOS: 4 days    Time spent: 30 minutes.     Hosie Poisson, MD [facility] Pager [phone]   If 7PM-7AM, please contact night-coverage [email or web address] Password  [email or web address] 02/09/2016, 1:48 PM

## 2016-02-09 NOTE — Progress Notes (Signed)
Physical Therapy Treatment Patient Details Name: Allen Beasley MRN: [id number] DOB: 1924/02/10 Today's Date: 02/09/2016    History of Present Illness Allen Beasley is a 82 y.o. male with medical history significant of COPD/emphysema, HTN, HMD, aortic insufficiency; who presents with complaints of shortness of breath.  route with EMS patient was given 125mg  Solumedrol, 10mg  Albuterol, and a DuoNeb treatment with mild relief of symptoms. Upon admission 1-/25/17 to emergency department patient was evaluated and seen to be febrile up to 100.26F, heart rate is 93-114, respirations 19-38, O2 saturations as low as 78%, and blood pressure seen as high as 194/77. Small left side pleural effusion.    PT Comments    Patient was seen in bed upon arrival. Performing supine to sit and sit to stand requiring min guard. Required VC's for safe UE placement when standing and before sitting. Gait x 60 ft x min guard. Patient is a mouth breather and requires >50% VC's to utilize pursed lip breathing to improve energy conservation techniques. Patients RR stayed between 25-27 and O2 decreased to 84% during ambulation. Patient is on 10 L of supplementary O2. Patient has limited activity tolerance due to fatigue, poor endurance, and poor utilization of energy conservation techniques.   Follow Up Recommendations  SNF;Supervision/Assistance - 24 hour     Equipment Recommendations  None recommended by PT    Recommendations for Other Services       Precautions / Restrictions Precautions Precautions: Fall Precaution Comments: on [place] oxygen Restrictions Weight Bearing Restrictions: No    Mobility  Bed Mobility Overal bed mobility: Needs Assistance Bed Mobility: Supine to Sit     Supine to sit: Min guard     General bed mobility comments: Extra time requiried with use of rails. VC's required to shift hips completely forward before attempting sit to stand. O2 dropped to 87% while sitting on EOB.    Transfers Overall transfer level: Needs assistance Equipment used: Rolling walker (2 wheeled) Transfers: Sit to/from Stand Sit to Stand: Min assist;Min guard;+2 safety/equipment         General transfer comment: VC's required for safe hand placement to push up for the bed when standing and to reach back for chair before sitting.   Ambulation/Gait Ambulation/Gait assistance: Min guard;+2 safety/equipment Ambulation Distance (Feet): 60 Feet Assistive device: Rolling walker (2 wheeled) Gait Pattern/deviations: Step-through pattern;Decreased step length - right;Decreased step length - left;Decreased stride length;Trunk flexed Gait velocity: decreased Gait velocity interpretation: <1.8 ft/sec, indicative of risk for recurrent falls General Gait Details: Assist needed for lines. On 10 L of O2 during ambulation. RR stayed between 25-27. O2 dropped to 84%.    Stairs            Wheelchair Mobility    Modified Rankin (Stroke Patients Only)       Balance                                    Cognition Arousal/Alertness: Awake/alert Behavior During Therapy: WFL for tasks assessed/performed Overall Cognitive Status: Within Functional Limits for tasks assessed                      Exercises      General Comments        Pertinent Vitals/Pain Pain Assessment: No/denies pain    Home Living  Prior Function            PT Goals (current goals can now be found in the care plan section) Progress towards PT goals: Progressing toward goals    Frequency    Min 3X/week      PT Plan      Co-evaluation             End of Session Equipment Utilized During Treatment: Oxygen;Gait belt Activity Tolerance: Patient tolerated treatment well;Patient limited by fatigue Patient left: in chair;with call bell/phone within reach;with chair alarm set     Time:  - 10:30 - 11:05    Charges:    1 gt  1 ta                    G CodesHall Busing, SPTA [facility] [phone]  Present during session and agree with above  Rica Koyanagi  PTA [facility] Pager      [phone]

## 2016-02-10 ENCOUNTER — Inpatient Hospital Stay (HOSPITAL_COMMUNITY): Payer: Medicare Other

## 2016-02-10 DIAGNOSIS — J690 Pneumonitis due to inhalation of food and vomit: Secondary | ICD-10-CM

## 2016-02-10 LAB — CBC
HCT: 45.4 % (ref 39.0–52.0)
Hemoglobin: 14.7 g/dL (ref 13.0–17.0)
MCH: 29.3 pg (ref 26.0–34.0)
MCHC: 32.4 g/dL (ref 30.0–36.0)
MCV: 90.4 fL (ref 78.0–100.0)
PLATELETS: 248 10*3/uL (ref 150–400)
RBC: 5.02 MIL/uL (ref 4.22–5.81)
RDW: 13.1 % (ref 11.5–15.5)
WBC: 17.7 10*3/uL — ABNORMAL HIGH (ref 4.0–10.5)

## 2016-02-10 LAB — BASIC METABOLIC PANEL
Anion gap: 8 (ref 5–15)
BUN: 43 mg/dL — AB (ref 6–20)
CALCIUM: 8.8 mg/dL — AB (ref 8.9–10.3)
CHLORIDE: 92 mmol/L — AB (ref 101–111)
CO2: 36 mmol/L — AB (ref 22–32)
CREATININE: 0.72 mg/dL (ref 0.61–1.24)
GFR calc non Af Amer: >60 mL/min (ref >60)
Glucose, Bld: 167 mg/dL — ABNORMAL HIGH (ref 65–99)
Potassium: 3.9 mmol/L (ref 3.5–5.1)
SODIUM: 136 mmol/L (ref 135–145)

## 2016-02-10 LAB — CULTURE, BLOOD (ROUTINE X 2): CULTURE: NO GROWTH

## 2016-02-10 LAB — GLUCOSE, CAPILLARY: GLUCOSE-CAPILLARY: 172 mg/dL — AB (ref 65–99)

## 2016-02-10 MED ORDER — ORAL CARE MOUTH RINSE
15.0000 mL | Freq: Two times a day (BID) | OROMUCOSAL | Status: DC
  Administered 2016-02-10 – 2016-02-17 (×13): 15 mL via OROMUCOSAL

## 2016-02-10 MED ORDER — CLINDAMYCIN HCL 300 MG PO CAPS
300.0000 mg | ORAL_CAPSULE | Freq: Three times a day (TID) | ORAL | Status: DC
  Administered 2016-02-10 – 2016-02-15 (×16): 300 mg via ORAL
  Filled 2016-02-10 (×17): qty 1

## 2016-02-10 MED ORDER — GI COCKTAIL ~~LOC~~
30.0000 mL | Freq: Two times a day (BID) | ORAL | Status: DC | PRN
  Administered 2016-02-10 – 2016-02-13 (×3): 30 mL via ORAL
  Filled 2016-02-10 (×3): qty 30

## 2016-02-10 MED ORDER — GUAIFENESIN-DM 100-10 MG/5ML PO SYRP
5.0000 mL | ORAL_SOLUTION | ORAL | Status: DC | PRN
  Administered 2016-02-10: 5 mL via ORAL
  Filled 2016-02-10: qty 10

## 2016-02-10 MED ORDER — NYSTATIN 100000 UNIT/ML MT SUSP
5.0000 mL | Freq: Four times a day (QID) | OROMUCOSAL | Status: DC
  Administered 2016-02-10 – 2016-02-17 (×27): 500000 [IU] via ORAL
  Filled 2016-02-10 (×27): qty 5

## 2016-02-10 NOTE — Progress Notes (Signed)
Patient ambulated around half of unit on NRB mask - 100%. O2 sats stayed >88%. Ambulated with walker and 2 staff. VSS.

## 2016-02-10 NOTE — Progress Notes (Signed)
Date:  February 10, 2016 Chart reviewed for concurrent status and case management needs. Will continue to follow the patient for status change: Discharge Planning: following for needs Expected discharge date: [id number] [name], BSN, [place], [place]

## 2016-02-10 NOTE — Consult Note (Signed)
Name: Allen Beasley MRN: [id number] DOB: July 02, 1923    ADMISSION DATE:  02/04/2016 CONSULTATION DATE:  10/31  REFERRING MD :  Karleen Hampshire   CHIEF COMPLAINT:  Refractory hypoxia   BRIEF PATIENT DESCRIPTION:  72 yom w/ known h/o COPD. Supposed to be O2 but does not take because "doesn't help". Admitted w/ working dx of CAP/AECOPD. PCCM asked to see on 10/31 for failure to improve w/ appropriate treatment.   SIGNIFICANT EVENTS  Bedside swallow 10/30: odynophagia, refluxing Consulted and seen by palliative   STUDIES:  CT chest 10/30: negative for PE. Trace right effusion, small to mod left effusion w/ possible loculation, emphysema changes, LLL necrotic changes, Dense Bilateral consolidations R>L.  ECHO 10/30: EF [id number], grade I diastolic dyfxn,   HISTORY OF PRESENT ILLNESS:    81 y.o. male with medical history significant of COPD/emphysema, HTN, HMD, aortic insufficiency; who presented 10/26 with cc: of shortness of breath. Symptoms started acutely that day. Associated symptoms included wheezing, minimal cough, sharp left-sided abdominal pain, abdominal distention, constipation with last bowel movement 2 days ago, Patient denied any recent sick contacts, fever, chills, chest pain, urinary frequency, nausea, vomiting, diarrhea, leg swelling, or change in weight. On initial eval his temp was 100.86F, heart rate is 93-114, respirations 19-38, O2 saturations as low as 78%, and blood pressure seen as high as 194/77. He was placed on BiPAP for short period in time but was able to be transitioned to 6 L of nasal cannula oxygen to maintain O2 sats greater than 92%. He was placed on empiric abx w/ working dx of CAP and AECOPD.  From 10/26 to 10/31: treated appropriately w/ scheduled BDs, oxygen, systemic steroids and abx. In spite of appropriate therapy they were unable to wean oxygen (of note was supposed to be on this at baseline but felt "it didn't need it").  Developed relatively acute odynophagia and  difficulty swallowing around 10/29 w/ voice change and cough. A CT of chest was obtained on 10/30 which was negative for PE but did show bibasilar dense consolidations L>R, L>R effusions and narrowing of bilateral airways. Also concern for early necrotic changes in left lingula. He was also seen by SLP who identified significant esophageal symptoms & reflux. PCCM was asked to see on 10/31 for on-going hypoxia.    PAST MEDICAL HISTORY :   has a past medical history of COPD (chronic obstructive pulmonary disease) ([place]); Erectile dysfunction; History of aortic insufficiency; History of chest pain; Hyperlipidemia; and Hypertension.  has a past surgical history that includes Cataract extraction, bilateral (2001); Lithotripsy (2001); Tonsilectomy, adenoidectomy, bilateral myringotomy and tubes (1932); Total hip arthroplasty (07); and Joint replacement. Prior to Admission medications   Medication Sig Start Date End Date Taking? Authorizing Provider  simvastatin (ZOCOR) 80 MG tablet Take 80 mg by mouth daily.   Yes Historical Provider, MD  SPIRIVA HANDIHALER 18 MCG inhalation capsule PLACE 1 CAPSULE INTO INHALER AND INHALE THE CONTENTS OF 1 CAPSULE ONCE DAILY 04/02/14  Yes Deneise Lever, MD  terazosin (HYTRIN) 2 MG capsule Take 2 mg by mouth at bedtime.     Yes Historical Provider, MD  doxycycline (VIBRAMYCIN) 100 MG capsule Take 1 capsule by mouth 2 (two) times daily. 02/03/16   Historical Provider, MD   Allergies  Allergen Reactions  . Penicillins Hives and Swelling    FAMILY HISTORY:  family history includes Cancer in his father; Heart attack in his father; Heart disease in his father. SOCIAL HISTORY:  reports that he quit  smoking about 14 years ago. His smoking use included Cigarettes. He has a 59.00 pack-year smoking history. He has never used smokeless tobacco. He reports that he drinks alcohol.  REVIEW OF SYSTEMS:   Constitutional:  Fever-->improved, no chills, weight loss, malaise/fatigue  and diaphoresis.  HENT: Negative for hearing loss, ear pain, nosebleeds, congestion, sore throat, neck pain, tinnitus and ear discharge.   Eyes: Negative for blurred vision, double vision, photophobia, pain, discharge and redness.  Respiratory: +cough-->this has been persistent. Mostly now w/ PO intake but this is not a chronic problem, no hemoptysis, + sputum production, shortness of breath, wheezing and stridor.   Cardiovascular: Negative for chest pain, palpitations, orthopnea, claudication, leg swelling and PND.  Gastrointestinal: + heartburn, nausea, vomiting, abdominal pain, diarrhea, constipation, blood in stool and melena. ++ pain w/ swallowing Genitourinary: Negative for dysuria, urgency, frequency, hematuria and flank pain.  Musculoskeletal: Negative for myalgias, back pain, joint pain and falls.  Skin: Negative for itching and rash.  Neurological: Negative for dizziness, tingling, tremors, sensory change, speech change, focal weakness, seizures, loss of consciousness, weakness and headaches.  Endo/Heme/Allergies: Negative for environmental allergies and polydipsia. Does not bruise/bleed easily.  SUBJECTIVE:  Feels better but painful swallowing is the biggest issue  VITAL SIGNS: Temp:  [97.5 F (36.4 C)-98.8 F (37.1 C)] 97.5 F (36.4 C) (10/31 0800) Pulse Rate:  [87-116] 99 (10/31 0900) Resp:  [17-36] 17 (10/31 0900) BP: (99-165)/(42-84) 117/52 (10/31 0800) SpO2:  [84 %-95 %] 84 % (10/31 0900)  PHYSICAL EXAMINATION: General:  Well developed no distress. Sitting up in chair.  Neuro:  Awake, oriented. No focal def.  HEENT:  NCAT, tongue is dry and cracked, posterior  pharynx is erythremic, marked rhonchus upper airway noises Cardiovascular:  RRR w/out MRG Lungs:  Bibasilar posterior rales and rhonchi, no accessory use  Abdomen: soft not tender + bowel sounds Musculoskeletal:  Equal st and bulk Skin:  Warm and dry    Recent Labs Lab 02/08/16 0332 02/09/16 0329  02/10/16 0323  NA 138 136 136  K 3.9 4.1 3.9  CL 95* 94* 92*  CO2 33* 32 36*  BUN 38* 41* 43*  CREATININE 0.85 0.80 0.72  GLUCOSE 181* 148* 167*    Recent Labs Lab 02/06/16 0358 02/07/16 1615 02/10/16 0323  HGB 12.5* 14.0 14.7  HCT 39.3 43.5 45.4  WBC 17.6* 18.4* 17.7*  PLT 223 287 248   ABG    Component Value Date/Time   PHART 7.480 (H) 02/09/2016 1826   PCO2ART 50.7 (H) 02/09/2016 1826   PO2ART 56.6 (L) 02/09/2016 1826   HCO3 37.4 (H) 02/09/2016 1826   TCO2 25 08/19/2012 1336   O2SAT 88.6 02/09/2016 1826   Dg Chest 2 View  Result Date: 02/09/2016 CLINICAL DATA:  Shortness of breath and wheezing. EXAM: CHEST  2 VIEW COMPARISON:  02/04/2016 and CT chest 06/19/2013. FINDINGS: Patient is rotated. Heart size stable. Thoracic aorta is calcified. Lungs are emphysematous. Airspace opacification is seen at the base of the left hemi thorax with a small left pleural effusion. Right lung is grossly clear. Tiny right pleural effusion. Degenerative changes are seen in the spine. IMPRESSION: 1. Left basilar airspace opacification may be due to pneumonia. Followup PA and lateral chest X-ray is recommended in 3-4 weeks following trial of antibiotic therapy to ensure resolution and exclude underlying malignancy. 2. Small bilateral pleural effusions, left greater than right. 3.  Aortic atherosclerosis (ICD10-170.0). Electronically Signed   By: Lorin Picket M.D.   On: 02/09/2016 08:58  Ct Angio Chest Pe W Or Wo Contrast  Result Date: 02/09/2016 CLINICAL DATA:  Aortic insufficiency sepsis, COPD exacerbation with acute respiratory failure and hypoxia EXAM: CT ANGIOGRAPHY CHEST WITH CONTRAST TECHNIQUE: Multidetector CT imaging of the chest was performed using the standard protocol during bolus administration of intravenous contrast. Multiplanar CT image reconstructions and MIPs were obtained to evaluate the vascular anatomy. CONTRAST:  100 mL Isovue 370 intravenous COMPARISON:  Chest x-ray  01/1929 2017, CT scan chest 06/19/2013 FINDINGS: Cardiovascular: No evidence for filling defect within the central or segmental pulmonary arteries to suggest the presence of an acute embolus. There is diffuse ectasia of the thoracic aorta with atherosclerosis. Coronary artery calcifications. No dissection or mediastinal hematoma. Heart size is nonenlarged. Trace pericardial effusion or thickening. Mediastinum/Nodes: There is mild thickening of the esophagus. Nonspecific sub cm mediastinal lymph nodes. No enlarged hilar or axillary nodes. Trachea and mainstem bronchi are within normal limits. Lungs/Pleura: Trace right-sided pleural effusion. Small to moderate left-sided pleural effusion with mild loculation at the left lower lobe apex. Marked emphysematous disease within the bilateral lungs. There is a focal subpleural airspace density in the lingula which demonstrates central low attenuation suggesting necrosis. There is atelectasis or infiltrate within the left lower lobe. There is streaky infiltrate in the right lower lobe. Airspace disease within the lingula. No pneumothorax. Upper Abdomen: Images through the upper abdomen demonstrate no acute abnormalities. Left adrenal nodularity unchanged. Musculoskeletal: Multilevel degenerative changes of the spine with exuberant anterior osteophytes at the cervical thoracic junction. Review of the MIP images confirms the above findings. IMPRESSION: 1. No CT evidence for acute pulmonary embolus or dissection. Mild diffuse ectasia of the thoracic aorta with atherosclerosis. 2. Trace right-sided pleural effusion, small to moderate left-sided pleural effusion with mild loculation at the apical portion of left lower lobe. Hazy infiltrate in the right lower lobe and atelectasis or mild infiltrate left lower lobe. Focal subpleural masslike area of consolidation within the lingula which demonstrates central low attenuation. This could represent a focus of necrotic pneumonia or a  possible septic embolus. 3. Marked emphysematous disease of the bilateral lungs Electronically Signed   By: Donavan Foil M.D.   On: 02/09/2016 21:59    ASSESSMENT / PLAN:  Acute on Chronic Hypoxic Respiratory Failure  Aspiration Pna Necrotizing PNA AECOPD Small bilateral Pleural effusions; likely parapneumonic Severe esophageal dysmotility w/ reflux Grade I diastolic dysfxn.  Possible candidal esophagitis  Odynophagia   Discussion.  His refractory hypoxia is easily explained by what is likely aspiration PNA w/ bibasilar consolidations and possible early necrotizing PNA on left. He seems to be exhibiting significant esophageal dysmotility but based on his symptom report this is relatively acute. Certainly he could have chronic esophageal dysmotility and be chronically aspirating. Although it seems as though there is definitely an acute on chronic component here.. Candidal esophagitis would cause some of these symptoms (which would make sense given the time frame of the odynophagia really starting about 3-4d after abx). But apparently also had nausea and vomiting prior to admit so ? Also possibility of stricture??  Plan Add oral Nystatin swish and swallow qid Cont to wean O2 for sats > 88% Cont spiriva and rescue BDs Agree w/ SLP recs re: reflux precautions Cont pred taper  Cont Levaquin  Will order esophagram -->depending on this may need to consider GI eval.  Will increase PPI   Erick Colace ACNP-BC [place] Pager # [phone] OR # [phone] if no answer   02/10/2016, 9:52  AM

## 2016-02-10 NOTE — Progress Notes (Signed)
PROGRESS NOTE    Allen Beasley  [id number] DOB: [birth year] DOA: 02/04/2016 PCP: Geoffery Lyons, MD    Brief Narrative: 82 year old gentleman admitted for sepsis from community acquired pneumonia and acute respiratory failure with hypoxia from copd exacerbation. He was started on IV steroids, IV antibiotics and admitted to step down for futher evaluation. As his wheezing improved, IV steroids tapered to po prednisone. He remained hypoxic, despite the antibiotics and steroids, . CT angio of chest ruled out PE. Pulmonary consulted recommended adding anerobic coverage for possible aspiration pneumonia.  Meanwhile patient had coffee ground emesis on the day of admission and the last two days occasionally, associated with some odynophagia and dysphagia. SLP eval, suggested esophageal etiology.  Started him on PPI and carafate, GI cocktail. Barium swallow ordered by PCCM.  GI consulted on 10/31 to see if he needs EGD. Dr Watt Climes to see the patient.   Assessment & Plan:   Principal Problem:   Sepsis ([place]) Active Problems:   CAP (community acquired pneumonia)   Acute respiratory failure with hypoxia ([facility])   BPH (benign prostatic hyperplasia)   Hyperglycemia   Goals of care, counseling/discussion   Palliative care encounter   Acute on chronic respiratory failure with hypoxia ([place])   Sepsis from community acquired pneumonia:  - improved.  - persistent leukocytosis, though afebrile, probably from the steroids.  - lactic acid normalized, and pro calcitonin elevated.  - CT abdomen and pelvis ordered for coffee ground emesis, which showed constipation, and moderate bilateral pleural effusions and lingular density,  no other abnormalities.   Acute respiratory failure with hypoxia probably from a combination of copd exacerbation and community acquired pneumonia. / aspiration pneumonia. Started on IV steroids, levaquin. Urine for legionella and strep pneumonia antigen negative. Started tapering  steroids as his wheezing has improved . Clindamycin added by PCCM.  Blood cultures are negative so far.  [place] oxygen as needed to keep sats greater than 90%, currently at 8 to 10 liters.  ABG shows persistent hypoxia, CT angio ruled out PE, showed mild to mod left pleural effusion. ECHO showed grade 1 diastolic dysfunction.    Pt reports some difficulty swallowing to solids, SLP evaluation recommended  Component of esophagitis ? GERD, started him on protonix and sucralfate.  Gi consulted to evaluate for recommendations.    Hypertension: controlled.    BPH: on hytrin.    Constipation:  MOM helped resolve.   Moderate bilateral pleural effusions: Started him on lasix for diuresis,  Diuresed about 5 liters since admission. Stopped lasix on 10/28. Echocardiogram ordered showed severe LVH, systolic function [id number], wall motion is normal and regional wall abnormalities. Grade 1 diastolic dysfunction.    Hematuria: lovenox stopped and on SCD'S for DVT prophylaxis,  Urine clear today, restart lovenox.  Unclear etiology. No hydronephrosis on CT abd and plevis, no UTI and he denies any dysuria. Urine cultures have been negative.   Social:  Pt lives by himself and apparently took himself off his oxygen,.  Family wants him to go to ALF.  Palliative care consulted for goals of care, recommendations to continue the same treatment.  Case manager consult for home health needs.  PT recommended SNF, he is currently resistant to the idea of going to rehab. He wants to go home and her niece from [place] is coming to help him.   DVT prophylaxis: scd's/ lovenox.  Code Status: full code.  Family Communication: none at bedside, discussed the plan of care with the patient, discussed plan  of care with son MICHAEL over the weekend Disposition Plan: home with home PT when we are able to wean his oxygen   Consultants:   Palliative care consult.   PCCM.    Procedures:  None.    Antimicrobials: one dose  of vancomycin and aztreonam.   levaquin from 10/26  Clindamycin 10/31   Subjective: No new complaints, reports feeling good.    Objective: Vitals:   02/10/16 1700 02/10/16 1730 02/10/16 1751 02/10/16 1800  BP:    (!) 147/79  Pulse: (!) 102 98 95 97  Resp: (!) 21 16 (!) 29 (!) 23  Temp:      TempSrc:      SpO2: (!) 88% (!) 87% 91% 92%  Weight:      Height:        Intake/Output Summary (Last 24 hours) at 02/10/16 1815 Last data filed at 02/10/16 1744  Gross per 24 hour  Intake              990 ml  Output             1101 ml  Net             -111 ml   Filed Weights   02/05/16 0215 02/05/16 0500  Weight: 84.4 kg (186 lb 1.1 oz) 84.4 kg (186 lb 1.1 oz)    Examination:  General exam: Appears comfortable still on 10 lit of [place] oxygen Respiratory system: no wheezing, air entry better.  Cardiovascular system: S1 & S2 heard, RRR. No JVD, murmurs, rubs, gallops or clicks. No pedal edema. Gastrointestinal system: Abdomen is nondistended, soft and nontender. No organomegaly or masses felt. Normal bowel sounds heard. Central nervous system: Alert and oriented. No focal neurological deficits. Extremities: Symmetric 5 x 5 power. Skin: No rashes, lesions or ulcers Psychiatry: Judgement and insight appear normal. Mood & affect appropriate.     Data Reviewed: I have personally reviewed following labs and imaging studies  CBC:  Recent Labs Lab 02/04/16 2146 02/05/16 0400 02/06/16 0358 02/07/16 1615 02/10/16 0323  WBC 13.6* 17.5* 17.6* 18.4* 17.7*  NEUTROABS 10.3*  --   --  16.6*  --   HGB 14.6 13.1 12.5* 14.0 14.7  HCT 46.1 41.2 39.3 43.5 45.4  MCV 92.0 90.9 90.6 90.8 90.4  PLT 222 203 223 287 [id number]   Basic Metabolic Panel:  Recent Labs Lab 02/06/16 1754 02/07/16 1615 02/08/16 0332 02/09/16 0329 02/10/16 0323  NA 136 137 138 136 136  K 3.9 4.0 3.9 4.1 3.9  CL 101 95* 95* 94* 92*  CO2 27 31 33* 32 36*  GLUCOSE 159* 236* 181* 148* 167*  BUN 33* 35* 38* 41* 43*    CREATININE 0.59* 0.77 0.85 0.80 0.72  CALCIUM 8.8* 8.9 8.7* 8.5* 8.8*   GFR: Estimated Creatinine Clearance: 65.4 mL/min (by C-G formula based on SCr of 0.72 mg/dL). Liver Function Tests:  Recent Labs Lab 02/04/16 2146  AST 23  ALT 13*  ALKPHOS 63  BILITOT 0.8  PROT 7.5  ALBUMIN 4.0    Recent Labs Lab 02/04/16 2146  LIPASE 19   No results for input(s): AMMONIA in the last 168 hours. Coagulation Profile: No results for input(s): INR, PROTIME in the last 168 hours. Cardiac Enzymes:  Recent Labs Lab 02/05/16 0400 02/05/16 1008 02/05/16 1601  TROPONINI <0.03 <0.03 <0.03   BNP (last 3 results) No results for input(s): PROBNP in the last 8760 hours. HbA1C: No results for input(s): HGBA1C in the last  72 hours. CBG:  Recent Labs Lab 02/10/16 0729  GLUCAP 172*   Lipid Profile: No results for input(s): CHOL, HDL, LDLCALC, TRIG, CHOLHDL, LDLDIRECT in the last 72 hours. Thyroid Function Tests: No results for input(s): TSH, T4TOTAL, FREET4, T3FREE, THYROIDAB in the last 72 hours. Anemia Panel: No results for input(s): VITAMINB12, FOLATE, FERRITIN, TIBC, IRON, RETICCTPCT in the last 72 hours. Sepsis Labs:  Recent Labs Lab 02/04/16 2146 02/04/16 2202 02/05/16 0044 02/05/16 0400  PROCALCITON  --   --   --  3.14  LATICACIDVEN 2.7* 3.22* 1.7 1.7    Recent Results (from the past 240 hour(s))  Urine culture     Status: Abnormal   Collection Time: 02/04/16  9:39 PM  Result Value Ref Range Status   Specimen Description URINE, CLEAN CATCH  Final   Special Requests NONE  Final   Culture MULTIPLE SPECIES PRESENT, SUGGEST RECOLLECTION (A)  Final   Report Status 02/06/2016 FINAL  Final  Blood culture (routine x 2)     Status: None   Collection Time: 02/04/16  9:44 PM  Result Value Ref Range Status   Specimen Description BLOOD LEFT WRIST  Final   Special Requests BOTTLES DRAWN AEROBIC AND ANAEROBIC 5CC  Final   Culture   Final    NO GROWTH 5 DAYS Performed at  [facility]    Report Status 02/09/2016 FINAL  Final  MRSA PCR Screening     Status: None   Collection Time: 02/05/16  2:40 AM  Result Value Ref Range Status   MRSA by PCR NEGATIVE NEGATIVE Final    Comment:        The GeneXpert MRSA Assay (FDA approved for NASAL specimens only), is one component of a comprehensive MRSA colonization surveillance program. It is not intended to diagnose MRSA infection nor to guide or monitor treatment for MRSA infections.   Blood culture (routine x 2)     Status: None   Collection Time: 02/05/16  4:00 AM  Result Value Ref Range Status   Specimen Description BLOOD LEFT ARM  Final   Special Requests BOTTLES DRAWN AEROBIC ONLY 6 CC  Final   Culture   Final    NO GROWTH 5 DAYS Performed at [facility]    Report Status 02/10/2016 FINAL  Final  Culture, Urine     Status: None   Collection Time: 02/05/16  4:30 AM  Result Value Ref Range Status   Specimen Description URINE, RANDOM  Final   Special Requests NONE  Final   Culture NO GROWTH Performed at [facility]   Final   Report Status 02/06/2016 FINAL  Final         Radiology Studies: Dg Chest 2 View  Result Date: 02/09/2016 CLINICAL DATA:  Shortness of breath and wheezing. EXAM: CHEST  2 VIEW COMPARISON:  02/04/2016 and CT chest 06/19/2013. FINDINGS: Patient is rotated. Heart size stable. Thoracic aorta is calcified. Lungs are emphysematous. Airspace opacification is seen at the base of the left hemi thorax with a small left pleural effusion. Right lung is grossly clear. Tiny right pleural effusion. Degenerative changes are seen in the spine. IMPRESSION: 1. Left basilar airspace opacification may be due to pneumonia. Followup PA and lateral chest X-ray is recommended in 3-4 weeks following trial of antibiotic therapy to ensure resolution and exclude underlying malignancy. 2. Small bilateral pleural effusions, left greater than right. 3.  Aortic atherosclerosis  (ICD10-170.0). Electronically Signed   By: Lorin Picket M.D.  On: 02/09/2016 08:58   Ct Angio Chest Pe W Or Wo Contrast  Result Date: 02/09/2016 CLINICAL DATA:  Aortic insufficiency sepsis, COPD exacerbation with acute respiratory failure and hypoxia EXAM: CT ANGIOGRAPHY CHEST WITH CONTRAST TECHNIQUE: Multidetector CT imaging of the chest was performed using the standard protocol during bolus administration of intravenous contrast. Multiplanar CT image reconstructions and MIPs were obtained to evaluate the vascular anatomy. CONTRAST:  100 mL Isovue 370 intravenous COMPARISON:  Chest x-ray 01/1929 2017, CT scan chest 06/19/2013 FINDINGS: Cardiovascular: No evidence for filling defect within the central or segmental pulmonary arteries to suggest the presence of an acute embolus. There is diffuse ectasia of the thoracic aorta with atherosclerosis. Coronary artery calcifications. No dissection or mediastinal hematoma. Heart size is nonenlarged. Trace pericardial effusion or thickening. Mediastinum/Nodes: There is mild thickening of the esophagus. Nonspecific sub cm mediastinal lymph nodes. No enlarged hilar or axillary nodes. Trachea and mainstem bronchi are within normal limits. Lungs/Pleura: Trace right-sided pleural effusion. Small to moderate left-sided pleural effusion with mild loculation at the left lower lobe apex. Marked emphysematous disease within the bilateral lungs. There is a focal subpleural airspace density in the lingula which demonstrates central low attenuation suggesting necrosis. There is atelectasis or infiltrate within the left lower lobe. There is streaky infiltrate in the right lower lobe. Airspace disease within the lingula. No pneumothorax. Upper Abdomen: Images through the upper abdomen demonstrate no acute abnormalities. Left adrenal nodularity unchanged. Musculoskeletal: Multilevel degenerative changes of the spine with exuberant anterior osteophytes at the cervical thoracic  junction. Review of the MIP images confirms the above findings. IMPRESSION: 1. No CT evidence for acute pulmonary embolus or dissection. Mild diffuse ectasia of the thoracic aorta with atherosclerosis. 2. Trace right-sided pleural effusion, small to moderate left-sided pleural effusion with mild loculation at the apical portion of left lower lobe. Hazy infiltrate in the right lower lobe and atelectasis or mild infiltrate left lower lobe. Focal subpleural masslike area of consolidation within the lingula which demonstrates central low attenuation. This could represent a focus of necrotic pneumonia or a possible septic embolus. 3. Marked emphysematous disease of the bilateral lungs Electronically Signed   By: Donavan Foil M.D.   On: 02/09/2016 21:59   Dg Esophagus  Result Date: 02/10/2016 CLINICAL DATA:  Dysphagia.  Choked on foods this morning EXAM: ESOPHOGRAM/BARIUM SWALLOW TECHNIQUE: Single contrast examination was performed using  thin barium. FLUOROSCOPY TIME:  Fluoroscopy Time:  54 seconds Radiation Exposure Index (if provided by the fluoroscopic device): 3.8 mGy Number of Acquired Spot Images: 0 COMPARISON:  Chest CT 02/09/2016 FINDINGS: Fluoroscopic evaluation of swallowing demonstrates disruption of all primary esophageal peristaltic waves with stasis of contrast in the esophagus and occasional distal tertiary contractions. No fixed stricture, fold thickening or mass. IMPRESSION: Esophageal dysmotility.  No fixed stricture. Electronically Signed   By: Rolm Baptise M.D.   On: 02/10/2016 14:36        Scheduled Meds: . atorvastatin  40 mg Oral q1800  . clindamycin  300 mg Oral Q8H  . furosemide  40 mg Oral Daily  . guaiFENesin  600 mg Oral BID  . levalbuterol  0.63 mg Nebulization BID  . levofloxacin (LEVAQUIN) IV  750 mg Intravenous Q24H  . mouth rinse  15 mL Mouth Rinse BID  . nystatin  5 mL Oral QID  . pantoprazole  40 mg Oral Q0600  . polyethylene glycol  17 g Oral Daily  . predniSONE   40 mg Oral QAC breakfast  . senna-docusate  2 tablet Oral BID  . sucralfate  1 g Oral TID WC & HS  . terazosin  2 mg Oral QHS  . tiotropium  18 mcg Inhalation Daily   Continuous Infusions:    LOS: 5 days    Time spent: 30 minutes.     Hosie Poisson, MD [facility] Pager [phone]   If 7PM-7AM, please contact night-coverage [email or web address] Password [email or web address] 02/10/2016, 6:15 PM

## 2016-02-11 ENCOUNTER — Inpatient Hospital Stay (HOSPITAL_COMMUNITY): Payer: Medicare Other

## 2016-02-11 DIAGNOSIS — J9621 Acute and chronic respiratory failure with hypoxia: Secondary | ICD-10-CM

## 2016-02-11 DIAGNOSIS — J850 Gangrene and necrosis of lung: Secondary | ICD-10-CM

## 2016-02-11 DIAGNOSIS — R0902 Hypoxemia: Secondary | ICD-10-CM

## 2016-02-11 DIAGNOSIS — N401 Enlarged prostate with lower urinary tract symptoms: Secondary | ICD-10-CM

## 2016-02-11 DIAGNOSIS — K219 Gastro-esophageal reflux disease without esophagitis: Secondary | ICD-10-CM

## 2016-02-11 DIAGNOSIS — K210 Gastro-esophageal reflux disease with esophagitis: Secondary | ICD-10-CM

## 2016-02-11 LAB — BASIC METABOLIC PANEL
ANION GAP: 7 (ref 5–15)
BUN: 47 mg/dL — AB (ref 6–20)
CALCIUM: 8.6 mg/dL — AB (ref 8.9–10.3)
CO2: 39 mmol/L — AB (ref 22–32)
CREATININE: 0.79 mg/dL (ref 0.61–1.24)
Chloride: 91 mmol/L — ABNORMAL LOW (ref 101–111)
GFR calc Af Amer: >60 mL/min (ref >60)
GLUCOSE: 171 mg/dL — AB (ref 65–99)
Potassium: 4.1 mmol/L (ref 3.5–5.1)
Sodium: 137 mmol/L (ref 135–145)

## 2016-02-11 MED ORDER — LACTINEX PO CHEW
2.0000 | CHEWABLE_TABLET | Freq: Three times a day (TID) | ORAL | Status: DC
  Administered 2016-02-11 – 2016-02-17 (×16): 2 via ORAL
  Filled 2016-02-11 (×21): qty 2

## 2016-02-11 MED ORDER — BACID PO TABS
2.0000 | ORAL_TABLET | Freq: Three times a day (TID) | ORAL | Status: DC
  Filled 2016-02-11 (×2): qty 2

## 2016-02-11 NOTE — Progress Notes (Signed)
[facility]  PROGRESS NOTE  Allen Beasley [id number] DOB: [birth year] DOA: 02/04/2016 PCP: Geoffery Lyons, MD   Brief HPI:   82 year old male with a history of COPD/emphysema, hypertension, aortic insufficiency who was admitted with acute on chronic hypoxic respiratory failure, initially patient required BiPAP and was later transitioned to 6 L of nasal cannula to maintain O2 sats greater than 92%. Patient started on empiric antibiotics, IV Solu-Medrol and admitted to stepdown. Patient was seen by critical care medicine.   Subjective   This morning patient still requiring oxygen, though he says that breathing has improved. Denies any chest pain.   Assessment/Plan:     1. Acute on chronic hypoxic respiratory failure- slowly improving, multifactorial from underlying emphysema, aspiration pneumonia. Prednisone stopped per [facility], continue Levaquin and clindamycin. He will need 2-4 weeks course of antibiotics due to underlying necrotizing pneumonia. Repeat chest x-ray has been ordered by [facility] M. 2. Odynophagia- ? component of Candida esophagitis, started on nystatin 500,000 units oral 4 times daily. Continue Carafate, Protonix. 3. BPH- continue Hytrin 4. Hypertension- blood pressure controlled 5. Moderate bilateral pleural effusions- patient started on Lasix, echogram showed LVH, EF [id number], grade 1 diastolic dysfunction. 6. Hematuria- UA on 02/06/2016 showed significant RBCs, unclear etiology. No hydronephrosis on CT abdomen and pelvis. Urine cultures are negative. Lovenox has been stopped. Might need urology evaluation as outpatient. Hemoglobin has been stable in the hospital     DVT prophylaxis: SCDs, Lovenox was stopped due to hematuria on 02/06/2016.  Code Status: Full code  Family Communication: No family at bedside   Disposition Plan:  Pt lives by himself and apparently took himself off his oxygen,.  Family wants him to go to ALF.  Palliative care consulted for goals of  care, recommendations to continue the same treatment.  Case manager consult for home health needs.  PT recommended SNF, he is currently resistant to the idea of going to rehab. He wants to go home and her niece from [place] is coming to help him.   Consultants:  [facility]  Procedures:  None  Antibiotics:   Anti-infectives    Start     Dose/Rate Route Frequency Ordered Stop   02/10/16 1400  clindamycin (CLEOCIN) capsule 300 mg     300 mg Oral Every 8 hours 02/10/16 1138     02/06/16 1700  levofloxacin (LEVAQUIN) IVPB 750 mg     750 mg 100 mL/hr over 90 Minutes Intravenous Every 24 hours 02/06/16 1632     02/05/16 2200  levofloxacin (LEVAQUIN) IVPB 750 mg  Status:  Discontinued     750 mg 100 mL/hr over 90 Minutes Intravenous Daily at bedtime 02/05/16 0538 02/05/16 0946   02/05/16 2200  levofloxacin (LEVAQUIN) tablet 750 mg  Status:  Discontinued     750 mg Oral Daily at bedtime 02/05/16 1414 02/06/16 1632   02/05/16 1200  vancomycin (VANCOCIN) IVPB 750 mg/150 ml premix  Status:  Discontinued     750 mg 150 mL/hr over 60 Minutes Intravenous Every 12 hours 02/05/16 0538 02/05/16 1414   02/05/16 0600  aztreonam (AZACTAM) 1 g in dextrose 5 % 50 mL IVPB  Status:  Discontinued     1 g 100 mL/hr over 30 Minutes Intravenous Every 8 hours 02/05/16 0538 02/05/16 1414   02/04/16 2145  levofloxacin (LEVAQUIN) IVPB 750 mg     750 mg 100 mL/hr over 90 Minutes Intravenous  Once 02/04/16 2140 02/04/16 2358   02/04/16 2145  aztreonam (AZACTAM) 2 g in  dextrose 5 % 50 mL IVPB     2 g 100 mL/hr over 30 Minutes Intravenous  Once 02/04/16 2140 02/04/16 2228   02/04/16 2145  vancomycin (VANCOCIN) IVPB 1000 mg/200 mL premix     1,000 mg 200 mL/hr over 60 Minutes Intravenous  Once 02/04/16 2140 02/05/16 0142       Objective   Vitals:   02/11/16 0500 02/11/16 0600 02/11/16 0800 02/11/16 0835  BP:  (!) 143/60 (!) 139/114   Pulse: 81 78 87   Resp: 12 18 20    Temp:   98.4 F (36.9 C)   TempSrc:    Oral   SpO2: 94% 94% 92% 93%  Weight:      Height:        Intake/Output Summary (Last 24 hours) at 02/11/16 1106 Last data filed at 02/11/16 0800  Gross per 24 hour  Intake              870 ml  Output              850 ml  Net               20 ml   Filed Weights   02/05/16 0215 02/05/16 0500  Weight: 84.4 kg (186 lb 1.1 oz) 84.4 kg (186 lb 1.1 oz)     Physical Examination:  General exam: Appears calm and comfortable. Respiratory system: Decreased breath sounds bilaterally, mild tachypnea Cardiovascular system:  RRR. No  murmurs, rubs, gallops. No pedal edema. GI system: Abdomen is nondistended, soft and nontender. No organomegaly.  Central nervous system. No focal neurological deficits. 5 x 5 power in all extremities. Skin: No rashes, lesions or ulcers. Psychiatry: Alert, oriented x 3.Judgement and insight appear normal. Affect normal.    Data Reviewed: I have personally reviewed following labs and imaging studies  CBG:  Recent Labs Lab 02/10/16 0729  GLUCAP 172*    CBC:  Recent Labs Lab 02/04/16 2146 02/05/16 0400 02/06/16 0358 02/07/16 1615 02/10/16 0323  WBC 13.6* 17.5* 17.6* 18.4* 17.7*  NEUTROABS 10.3*  --   --  16.6*  --   HGB 14.6 13.1 12.5* 14.0 14.7  HCT 46.1 41.2 39.3 43.5 45.4  MCV 92.0 90.9 90.6 90.8 90.4  PLT 222 203 223 287 [id number]    Basic Metabolic Panel:  Recent Labs Lab 02/07/16 1615 02/08/16 0332 02/09/16 0329 02/10/16 0323 02/11/16 0331  NA 137 138 136 136 137  K 4.0 3.9 4.1 3.9 4.1  CL 95* 95* 94* 92* 91*  CO2 31 33* 32 36* 39*  GLUCOSE 236* 181* 148* 167* 171*  BUN 35* 38* 41* 43* 47*  CREATININE 0.77 0.85 0.80 0.72 0.79  CALCIUM 8.9 8.7* 8.5* 8.8* 8.6*    Recent Results (from the past 240 hour(s))  Urine culture     Status: Abnormal   Collection Time: 02/04/16  9:39 PM  Result Value Ref Range Status   Specimen Description URINE, CLEAN CATCH  Final   Special Requests NONE  Final   Culture MULTIPLE SPECIES PRESENT,  SUGGEST RECOLLECTION (A)  Final   Report Status 02/06/2016 FINAL  Final  Blood culture (routine x 2)     Status: None   Collection Time: 02/04/16  9:44 PM  Result Value Ref Range Status   Specimen Description BLOOD LEFT WRIST  Final   Special Requests BOTTLES DRAWN AEROBIC AND ANAEROBIC 5CC  Final   Culture   Final    NO GROWTH 5 DAYS Performed at [facility]    Report Status 02/09/2016 FINAL  Final  MRSA PCR Screening     Status: None   Collection Time: 02/05/16  2:40 AM  Result Value Ref Range Status   MRSA by PCR NEGATIVE NEGATIVE Final    Comment:        The GeneXpert MRSA Assay (FDA approved for NASAL specimens only), is one component of a comprehensive MRSA colonization surveillance program. It is not intended to diagnose MRSA infection nor to guide or monitor treatment for MRSA infections.   Blood culture (routine x 2)     Status: None   Collection Time: 02/05/16  4:00 AM  Result Value Ref Range Status   Specimen Description BLOOD LEFT ARM  Final   Special Requests BOTTLES DRAWN AEROBIC ONLY 6 CC  Final   Culture   Final    NO GROWTH 5 DAYS Performed at [facility]    Report Status 02/10/2016 FINAL  Final  Culture, Urine     Status: None   Collection Time: 02/05/16  4:30 AM  Result Value Ref Range Status   Specimen Description URINE, RANDOM  Final   Special Requests NONE  Final   Culture NO GROWTH Performed at [facility]   Final   Report Status 02/06/2016 FINAL  Final     Liver Function Tests:  Recent Labs Lab 02/04/16 2146  AST 23  ALT 13*  ALKPHOS 63  BILITOT 0.8  PROT 7.5  ALBUMIN 4.0    Recent Labs Lab 02/04/16 2146  LIPASE 19   No results for input(s): AMMONIA in the last 168 hours.  Cardiac Enzymes:  Recent Labs Lab 02/05/16 0400 02/05/16 1008 02/05/16 1601  TROPONINI <0.03 <0.03 <0.03      Studies: Ct Angio Chest Pe W Or Wo Contrast  Result Date: 02/09/2016 CLINICAL DATA:  Aortic insufficiency  sepsis, COPD exacerbation with acute respiratory failure and hypoxia EXAM: CT ANGIOGRAPHY CHEST WITH CONTRAST TECHNIQUE: Multidetector CT imaging of the chest was performed using the standard protocol during bolus administration of intravenous contrast. Multiplanar CT image reconstructions and MIPs were obtained to evaluate the vascular anatomy. CONTRAST:  100 mL Isovue 370 intravenous COMPARISON:  Chest x-ray 01/1929 2017, CT scan chest 06/19/2013 FINDINGS: Cardiovascular: No evidence for filling defect within the central or segmental pulmonary arteries to suggest the presence of an acute embolus. There is diffuse ectasia of the thoracic aorta with atherosclerosis. Coronary artery calcifications. No dissection or mediastinal hematoma. Heart size is nonenlarged. Trace pericardial effusion or thickening. Mediastinum/Nodes: There is mild thickening of the esophagus. Nonspecific sub cm mediastinal lymph nodes. No enlarged hilar or axillary nodes. Trachea and mainstem bronchi are within normal limits. Lungs/Pleura: Trace right-sided pleural effusion. Small to moderate left-sided pleural effusion with mild loculation at the left lower lobe apex. Marked emphysematous disease within the bilateral lungs. There is a focal subpleural airspace density in the lingula which demonstrates central low attenuation suggesting necrosis. There is atelectasis or infiltrate within the left lower lobe. There is streaky infiltrate in the right lower lobe. Airspace disease within the lingula. No pneumothorax. Upper Abdomen: Images through the upper abdomen demonstrate no acute abnormalities. Left adrenal nodularity unchanged. Musculoskeletal: Multilevel degenerative changes of the spine with exuberant anterior osteophytes at the cervical thoracic junction. Review of the MIP images confirms the above findings. IMPRESSION: 1. No CT evidence for acute pulmonary embolus or dissection. Mild diffuse ectasia of the thoracic aorta with  atherosclerosis. 2. Trace right-sided pleural effusion, small to  moderate left-sided pleural effusion with mild loculation at the apical portion of left lower lobe. Hazy infiltrate in the right lower lobe and atelectasis or mild infiltrate left lower lobe. Focal subpleural masslike area of consolidation within the lingula which demonstrates central low attenuation. This could represent a focus of necrotic pneumonia or a possible septic embolus. 3. Marked emphysematous disease of the bilateral lungs Electronically Signed   By: Donavan Foil M.D.   On: 02/09/2016 21:59   Dg Esophagus  Result Date: 02/10/2016 CLINICAL DATA:  Dysphagia.  Choked on foods this morning EXAM: ESOPHOGRAM/BARIUM SWALLOW TECHNIQUE: Single contrast examination was performed using  thin barium. FLUOROSCOPY TIME:  Fluoroscopy Time:  54 seconds Radiation Exposure Index (if provided by the fluoroscopic device): 3.8 mGy Number of Acquired Spot Images: 0 COMPARISON:  Chest CT 02/09/2016 FINDINGS: Fluoroscopic evaluation of swallowing demonstrates disruption of all primary esophageal peristaltic waves with stasis of contrast in the esophagus and occasional distal tertiary contractions. No fixed stricture, fold thickening or mass. IMPRESSION: Esophageal dysmotility.  No fixed stricture. Electronically Signed   By: Rolm Baptise M.D.   On: 02/10/2016 14:36    Scheduled Meds: . atorvastatin  40 mg Oral q1800  . clindamycin  300 mg Oral Q8H  . furosemide  40 mg Oral Daily  . guaiFENesin  600 mg Oral BID  . lactobacillus acidophilus & bulgar  2 tablet Oral TID WC  . levalbuterol  0.63 mg Nebulization BID  . levofloxacin (LEVAQUIN) IV  750 mg Intravenous Q24H  . mouth rinse  15 mL Mouth Rinse BID  . nystatin  5 mL Oral QID  . pantoprazole  40 mg Oral Q0600  . polyethylene glycol  17 g Oral Daily  . senna-docusate  2 tablet Oral BID  . sucralfate  1 g Oral TID WC & HS  . terazosin  2 mg Oral QHS  . tiotropium  18 mcg Inhalation Daily     Continuous Infusions:   Time spent: 25 min  [facility] Pager [phone]. If 7PM-7AM, please contact night-coverage at [email or web address], Office  [phone]  password [email or web address] 02/11/2016, 11:06 AM  LOS: 6 days

## 2016-02-11 NOTE — Progress Notes (Signed)
Date:  February 11, 2016 Chart reviewed for concurrent status and case management needs. Will continue to follow the patient for status change: Discharge Planning: following for needs {[place] has home o2 through advanced home health care and does have concentrator at home/oxymiser obtained through [facility] go up to 15l/min. [name], BSN, [place], [place]

## 2016-02-11 NOTE — Progress Notes (Signed)
PT Cancellation Note  Patient Details Name: Allen Beasley MRN: [id number] DOB: Jul 13, 1923   Cancelled Treatment:    Pt was able to amb with nursing today so will check back another day as schedule permits.    Rica Koyanagi  PTA [facility]  Acute  Rehab Pager      [phone]

## 2016-02-11 NOTE — Progress Notes (Signed)
Name: Allen Beasley MRN: [id number] DOB: [birth year]    ADMISSION DATE:  02/04/2016 CONSULTATION DATE:  10/31  REFERRING MD :  Karleen Hampshire   CHIEF COMPLAINT:  Refractory hypoxia   BRIEF PATIENT DESCRIPTION:  37 yom w/ known h/o COPD who doesn't take his prescribed home oxygen admitted on 10/25 with acute on chronci respiratory failure with hypoxemia in setting of necrotizing pneumonia in the left lower lobe.    SIGNIFICANT EVENTS  Bedside swallow 10/30: odynophagia, refluxing Consulted and seen by palliative   STUDIES:  CT chest 10/30: negative for PE. Trace right effusion, small to mod left effusion w/ possible loculation, emphysema changes, LLL necrotic changes, Dense Bilateral consolidations R>L.  ECHO 10/30: EF Q000111Q, grade I diastolic dyfxn,  [id number] barium swallow> no esophageal peristalsis   SUBJECTIVE:  Feels a little better today   VITAL SIGNS: Temp:  [97.6 F (36.4 C)-98.4 F (36.9 C)] 98.4 F (36.9 C) (11/01 0800) Pulse Rate:  [78-102] 87 (11/01 0800) Resp:  [12-39] 20 (11/01 0800) BP: (120-151)/(46-114) 139/114 (11/01 0800) SpO2:  [86 %-98 %] 93 % (11/01 0835)  PHYSICAL EXAMINATION: General:  Resting comfortably in bed HENT: NCAT OP clear PULM: surprisingly clear, no wheezing, good air movement, diminished left base CV: RRR, no mgr GI: BS+, soft, nontender MSK: normal bulk and tone Derm: some extensor surface bruising Neuro: A&Ox4, maew    Recent Labs Lab 02/09/16 0329 02/10/16 0323 02/11/16 0331  NA 136 136 137  K 4.1 3.9 4.1  CL 94* 92* 91*  CO2 32 36* 39*  BUN 41* 43* 47*  CREATININE 0.80 0.72 0.79  GLUCOSE 148* 167* 171*    Recent Labs Lab 02/06/16 0358 02/07/16 1615 02/10/16 0323  HGB 12.5* 14.0 14.7  HCT 39.3 43.5 45.4  WBC 17.6* 18.4* 17.7*  PLT 223 287 248   ABG    Component Value Date/Time   PHART 7.480 (H) 02/09/2016 1826   PCO2ART 50.7 (H) 02/09/2016 1826   PO2ART 56.6 (L) 02/09/2016 1826   HCO3 37.4 (H) 02/09/2016 1826     TCO2 25 08/19/2012 1336   O2SAT 88.6 02/09/2016 1826   Ct Angio Chest Pe W Or Wo Contrast  Result Date: 02/09/2016 CLINICAL DATA:  Aortic insufficiency sepsis, COPD exacerbation with acute respiratory failure and hypoxia EXAM: CT ANGIOGRAPHY CHEST WITH CONTRAST TECHNIQUE: Multidetector CT imaging of the chest was performed using the standard protocol during bolus administration of intravenous contrast. Multiplanar CT image reconstructions and MIPs were obtained to evaluate the vascular anatomy. CONTRAST:  100 mL Isovue 370 intravenous COMPARISON:  Chest x-ray 01/1929 2017, CT scan chest 06/19/2013 FINDINGS: Cardiovascular: No evidence for filling defect within the central or segmental pulmonary arteries to suggest the presence of an acute embolus. There is diffuse ectasia of the thoracic aorta with atherosclerosis. Coronary artery calcifications. No dissection or mediastinal hematoma. Heart size is nonenlarged. Trace pericardial effusion or thickening. Mediastinum/Nodes: There is mild thickening of the esophagus. Nonspecific sub cm mediastinal lymph nodes. No enlarged hilar or axillary nodes. Trachea and mainstem bronchi are within normal limits. Lungs/Pleura: Trace right-sided pleural effusion. Small to moderate left-sided pleural effusion with mild loculation at the left lower lobe apex. Marked emphysematous disease within the bilateral lungs. There is a focal subpleural airspace density in the lingula which demonstrates central low attenuation suggesting necrosis. There is atelectasis or infiltrate within the left lower lobe. There is streaky infiltrate in the right lower lobe. Airspace disease within the lingula. No pneumothorax. Upper Abdomen: Images through the  upper abdomen demonstrate no acute abnormalities. Left adrenal nodularity unchanged. Musculoskeletal: Multilevel degenerative changes of the spine with exuberant anterior osteophytes at the cervical thoracic junction. Review of the MIP images  confirms the above findings. IMPRESSION: 1. No CT evidence for acute pulmonary embolus or dissection. Mild diffuse ectasia of the thoracic aorta with atherosclerosis. 2. Trace right-sided pleural effusion, small to moderate left-sided pleural effusion with mild loculation at the apical portion of left lower lobe. Hazy infiltrate in the right lower lobe and atelectasis or mild infiltrate left lower lobe. Focal subpleural masslike area of consolidation within the lingula which demonstrates central low attenuation. This could represent a focus of necrotic pneumonia or a possible septic embolus. 3. Marked emphysematous disease of the bilateral lungs Electronically Signed   By: Donavan Foil M.D.   On: 02/09/2016 21:59   Dg Esophagus  Result Date: 02/10/2016 CLINICAL DATA:  Dysphagia.  Choked on foods this morning EXAM: ESOPHOGRAM/BARIUM SWALLOW TECHNIQUE: Single contrast examination was performed using  thin barium. FLUOROSCOPY TIME:  Fluoroscopy Time:  54 seconds Radiation Exposure Index (if provided by the fluoroscopic device): 3.8 mGy Number of Acquired Spot Images: 0 COMPARISON:  Chest CT 02/09/2016 FINDINGS: Fluoroscopic evaluation of swallowing demonstrates disruption of all primary esophageal peristaltic waves with stasis of contrast in the esophagus and occasional distal tertiary contractions. No fixed stricture, fold thickening or mass. IMPRESSION: Esophageal dysmotility.  No fixed stricture. Electronically Signed   By: Rolm Baptise M.D.   On: 02/10/2016 14:36   Images personally reviewed, necrotizing pneumonia left lower lobe on CT, pleural effusion (small present)  ASSESSMENT / PLAN:  Acute on Chronic Hypoxic Respiratory Failure  Aspiration Pneumonia> Necrotizing  COPD, not in exacerbation Small parapneumonic effusions Severe esophageal dysmotility w/ reflux Grade I diastolic dysfxn Odynophagia > candidal esophagitis?  Discussion.  Severe hypoxemia explained by baseline centrilobular  emphysema and acute necrotizing pneumonia in left lower lobe due to aspiration pneumonia.  He is aspirating due to lack of esophageal motility.  I suspect it will take weeks for his oxygenation to return to baseline.  It may never completely return to baseline.  Plan Cont to wean O2 for sats > 88% Cont spiriva and rescue BDs Agree w/ SLP recs re: reflux precautions Stop prednisone Continue levaquin and clindamycin> he will need a 3-4 week course of antibiotics given the necrotizing nature of his pneumonia Add probiotics given broad antibiotic coverage Consider GI evaluation> if swallowing improved 11/1 could probably hold off Continue PPI Repeat 2 view CXR 11/1, consider thoracentesis if pleural effusion increasing in size  Roselie Awkward, MD [facility] Pager: [phone] Cell: [phone] After 3pm or if no response, call [phone]  02/11/2016, 9:07 AM

## 2016-02-11 NOTE — Progress Notes (Signed)
Walked in hall with o2 to room 1228 and back. O2 sats low was 88.Tolerated well.Oxymizer tubing for home use placed in room.

## 2016-02-11 NOTE — Progress Notes (Signed)
Daily Progress Note   Patient Name: Allen Beasley       Date: 02/11/2016 DOB: [birth year]  Age: 82 y.o. MRN#: [id number] Attending Physician: Oswald Hillock, MD Primary Care Physician: Geoffery Lyons, MD Admit Date: 02/04/2016  Reason for Consultation/Follow-up: Disposition and Establishing goals of care  Subjective: Pt was resting comfortably in his bedside chair on our arrival. Breathing was relaxed and unlabored. He was alert and happy to engage in conversation.   Length of Stay: 6  Current Medications: Scheduled Meds:  . atorvastatin  40 mg Oral q1800  . clindamycin  300 mg Oral Q8H  . furosemide  40 mg Oral Daily  . guaiFENesin  600 mg Oral BID  . lactobacillus acidophilus & bulgar  2 tablet Oral TID WC  . levalbuterol  0.63 mg Nebulization BID  . levofloxacin (LEVAQUIN) IV  750 mg Intravenous Q24H  . mouth rinse  15 mL Mouth Rinse BID  . nystatin  5 mL Oral QID  . pantoprazole  40 mg Oral Q0600  . polyethylene glycol  17 g Oral Daily  . senna-docusate  2 tablet Oral BID  . sucralfate  1 g Oral TID WC & HS  . terazosin  2 mg Oral QHS  . tiotropium  18 mcg Inhalation Daily    Continuous Infusions:    PRN Meds: acetaminophen **OR** acetaminophen, gi cocktail, guaiFENesin-dextromethorphan, iopamidol, levalbuterol, magnesium hydroxide, ondansetron **OR** ondansetron (ZOFRAN) IV  Physical Exam    Constitutional: He is oriented to person, place, and time. He appears well-developed and well-nourished. No distress.  HENT:  Head: Normocephalic and atraumatic.  Mouth/Throat: Oropharynx is clear and moist.  Eyes: EOM are normal.  Neck: Normal range of motion.  Pulmonary/Chest:  Increased effort with prolonged conversation, noted to de-satruate to 84% with conversation.  Musculoskeletal: Normal range of motion.    Neurological: He is alert and oriented to person, place, and time.  Skin: Skin is warm and dry.  Psychiatric: He has a normal mood and affect. His behavior is normal. Judgment and thought content normal.        Vital Signs: BP (!) 139/114 (BP Location: Left Arm)   Pulse 87   Temp 98.4 F (36.9 C) (Oral)   Resp 20   Ht 5\' 11"  (1.803 m)   Wt 84.4 kg (186 lb 1.1 oz)   SpO2 93%   BMI 25.95 kg/m  SpO2: SpO2: 93 % O2 Device: O2 Device: High Flow Nasal Cannula O2 Flow Rate: O2 Flow Rate (L/min): 12 L/min  Intake/output summary:  Intake/Output Summary (Last 24 hours) at 02/11/16 1031 Last data filed at 02/11/16 0800  Gross per 24 hour  Intake              870 ml  Output              950 ml  Net              -80 ml   LBM: Last BM Date: 02/10/16 Baseline Weight: Weight: 84.4 kg (186 lb 1.1 oz) Most recent weight: Weight: 84.4 kg (186 lb 1.1 oz)       Palliative Assessment/Data: PPS 60%    Flowsheet Rows  Flowsheet Row Most Recent Value  Intake Tab  Referral Department  Hospitalist  Unit at Time of Referral  ICU  Palliative Care Primary Diagnosis  Pulmonary  Date Notified  02/06/16  Palliative Care Type  New Palliative care  Reason for referral  Clarify Goals of Care  Date of Admission  02/04/16  Date first seen by Palliative Care  02/09/16  # of days Palliative referral response time  3 Day(s)  # of days IP prior to Palliative referral  2  Clinical Assessment  Psychosocial & Spiritual Assessment  Palliative Care Outcomes      Patient Active Problem List   Diagnosis Date Noted  . Goals of care, counseling/discussion   . Palliative care encounter   . Acute on chronic respiratory failure with hypoxia ([place])   . Sepsis ([place]) 02/05/2016  . Acute respiratory failure with hypoxia ([place]) 02/05/2016  . BPH (benign prostatic hyperplasia) 02/05/2016  . Hyperglycemia 02/05/2016  . Thyroid nodule 09/15/2013  . CAP (community acquired pneumonia) 08/19/2012  . HTN  (hypertension) 08/05/2010  . Erectile dysfunction   . History of chest pain   . History of aortic insufficiency   . HYPERLIPIDEMIA 01/09/2010  . COPD with emphysema ([place]) 06/19/2007    Palliative Care Assessment & Plan   HPI: 82 y.o. male  with past medical history of COPD, aortic insufficiency, BPH, and HTN who was  admitted on 02/04/2016 with sepsis and COPD exacerbation with acute respiratory failure with hypoxia. Per Pulmonology note on 11/1, severe hypoxia is related to baseline centrilobular emphysema and acute necrotizing PNA in the LLL d/t aspiration PNA. Aspiration related to an esophageal motility. 3-4 week course of abx expected given the necrotizing nature of his PNA, and concern that it could take weeks for his oxygenation to return to baseline, and it may never completely return.  Assessment: Despite his high O2 requirement of 10-12L via HFNC, and his desaturation with talking, he continues to feel improved and reports improved appetite and exercise tolerance. He denies dyspnea, and seems unfazed by his desaturation during our extended conversation. We discussed the new pulmonary findings: the necrotizing nature of his PNA, duration of abx, and concern for very slow improvement (if any). He was thoughtful about these things, but seemed hopeful that, even at his current status, he could still enjoy his life and still move to [place] to be near family. Finally, his swallowing remains unchanged, though he has adjusted his diet for softer and easier to swallow food, which he has found helpful.   Overall, pt is clear on his goals: return home, and move to [place] in a few months to be closer to family. He understands that his COPD is not curable, and that his respiratory status may never significantly improve from his current state (and, if it does, it will be very slow). He continues to verbalize his plan to rely on family support to promote autonomy and independence for as long as possible.     Recommendations/Plan:  Disposition -SW consulted to investigate and set-up home O2, no equipment recommendations per PT -Niece to arrive on Thursday, plans to provide 24 hour support at his home for up to a month; will plan to meet with her tomorrow at 2pm to fully discuss this plan  Goals of care/Disposition -Pt is focused on maintaining independence and spending time with family. He is planning to relocate to [place] to be closer to family. In the interim, his niece will arrive on Thursday and provide 24 hour  assistance after discharge for as long as a month. -Touched on code status very briefly on 10/30; brought up the possibility that, eventually, our interventions may be more detrimental than beneficial to his primary goal of quality time with his family, and that not doing aggressive interventions may be a more appropriate option. He was open to the conversation, though I doubt he will make any code status changes at present.   Goals of Care and Additional Recommendations:  Limitations on Scope of Treatment: Full Scope Treatment  Code Status:  Full code  Prognosis:   Unable to determine  Discharge Planning:  Home with [place] was discussed with pt; plan to discuss with niece when she arrives on Thursday  Thank you for allowing the Palliative Medicine Team to assist in the care of this patient.   Time In: 0930 Time Out: 1020 Total Time 50 min Prolonged Time Billed  yes       Greater than 50%  of this time was spent counseling and coordinating care related to the above assessment and plan.  Charlynn Court, NP Palliative Medicine Team Team Phone # [phone]

## 2016-02-12 DIAGNOSIS — A419 Sepsis, unspecified organism: Principal | ICD-10-CM

## 2016-02-12 MED ORDER — ACETAMINOPHEN 325 MG PO TABS: 650.0000 mg | ORAL_TABLET | Freq: Four times a day (QID) | ORAL | Status: DC | PRN

## 2016-02-12 MED ORDER — ACETAMINOPHEN 325 MG PO TABS: 650.0000 mg | ORAL_TABLET | Freq: Four times a day (QID) | ORAL | Status: DC

## 2016-02-12 NOTE — Progress Notes (Signed)
[facility]  PROGRESS NOTE  Allen Beasley [id number] DOB: [birth year] DOA: 02/04/2016 PCP: Geoffery Lyons, MD   Brief HPI:   82 year old male with a history of COPD/emphysema, hypertension, aortic insufficiency who was admitted with acute on chronic hypoxic respiratory failure, initially patient required BiPAP and was later transitioned to 6 L of nasal cannula to maintain O2 sats greater than 92%. Patient started on empiric antibiotics, IV Solu-Medrol and admitted to stepdown. Patient was seen by critical care medicine.   Subjective   This morning patient is feeling better. Denies shortness of breath.   Assessment/Plan:     1. Acute on chronic hypoxic respiratory failure- slowly improving, multifactorial from underlying emphysema, aspiration pneumonia. Prednisone stopped per [facility] M, continue Levaquin and clindamycin. He will need 2-4 weeks course of antibiotics due to underlying necrotizing pneumonia. Repeat chest x-ray this morning shows persistent left lower lobe pneumonia. 2. Odynophagia- ? component of candida esophagitis, started on nystatin 500,000 units oral 4 times daily. Continue Carafate, Protonix. 3. BPH- continue Hytrin 4. Hypertension- blood pressure controlled 5. Moderate bilateral pleural effusions- patient started on Lasix, echogram showed LVH, EF [id number], grade 1 diastolic dysfunction. 6. Hematuria- UA on 02/06/2016 showed significant RBCs, unclear etiology. No hydronephrosis on CT abdomen and pelvis. Urine cultures are negative. Lovenox has been stopped. Might need urology evaluation as outpatient. Hemoglobin has been stable in the hospital     DVT prophylaxis: SCDs, Lovenox was stopped due to hematuria on 02/06/2016.  Code Status: Full code  Family Communication: No family at bedside   Disposition Plan:  Pt lives by himself and apparently took himself off his oxygen,.  Family wants him to go to ALF.  Palliative care consulted for goals of care,  recommendations to continue the same treatment.  Case manager consult for home health needs.  PT recommended SNF, he is currently resistant to the idea of going to rehab. He wants to go home and her niece from [place] is coming to help him.   Consultants:  [facility] M  Procedures:  None  Antibiotics:   Anti-infectives    Start     Dose/Rate Route Frequency Ordered Stop   02/10/16 1400  clindamycin (CLEOCIN) capsule 300 mg     300 mg Oral Every 8 hours 02/10/16 1138     02/06/16 1700  levofloxacin (LEVAQUIN) IVPB 750 mg     750 mg 100 mL/hr over 90 Minutes Intravenous Every 24 hours 02/06/16 1632     02/05/16 2200  levofloxacin (LEVAQUIN) IVPB 750 mg  Status:  Discontinued     750 mg 100 mL/hr over 90 Minutes Intravenous Daily at bedtime 02/05/16 0538 02/05/16 0946   02/05/16 2200  levofloxacin (LEVAQUIN) tablet 750 mg  Status:  Discontinued     750 mg Oral Daily at bedtime 02/05/16 1414 02/06/16 1632   02/05/16 1200  vancomycin (VANCOCIN) IVPB 750 mg/150 ml premix  Status:  Discontinued     750 mg 150 mL/hr over 60 Minutes Intravenous Every 12 hours 02/05/16 0538 02/05/16 1414   02/05/16 0600  aztreonam (AZACTAM) 1 g in dextrose 5 % 50 mL IVPB  Status:  Discontinued     1 g 100 mL/hr over 30 Minutes Intravenous Every 8 hours 02/05/16 0538 02/05/16 1414   02/04/16 2145  levofloxacin (LEVAQUIN) IVPB 750 mg     750 mg 100 mL/hr over 90 Minutes Intravenous  Once 02/04/16 2140 02/04/16 2358   02/04/16 2145  aztreonam (AZACTAM) 2 g in dextrose 5 % 50 mL  IVPB     2 g 100 mL/hr over 30 Minutes Intravenous  Once 02/04/16 2140 02/04/16 2228   02/04/16 2145  vancomycin (VANCOCIN) IVPB 1000 mg/200 mL premix     1,000 mg 200 mL/hr over 60 Minutes Intravenous  Once 02/04/16 2140 02/05/16 0142       Objective   Vitals:   02/12/16 0345 02/12/16 0400 02/12/16 0800 02/12/16 0855  BP:  137/61    Pulse:      Resp:  14    Temp: 98.3 F (36.8 C)  98.5 F (36.9 C)   TempSrc: Oral  Oral    SpO2:  94%  93%  Weight:      Height:        Intake/Output Summary (Last 24 hours) at 02/12/16 1215 Last data filed at 02/12/16 0752  Gross per 24 hour  Intake              470 ml  Output              450 ml  Net               20 ml   Filed Weights   02/05/16 0215 02/05/16 0500  Weight: 84.4 kg (186 lb 1.1 oz) 84.4 kg (186 lb 1.1 oz)     Physical Examination:  General exam: Appears calm and comfortable. Respiratory system: Decreased breath sounds bilaterally, mild tachypnea Cardiovascular system:  RRR. No  murmurs, rubs, gallops. No pedal edema. GI system: Abdomen is nondistended, soft and nontender. No organomegaly.  Central nervous system. No focal neurological deficits. 5 x 5 power in all extremities. Skin: No rashes, lesions or ulcers. Psychiatry: Alert, oriented x 3.Judgement and insight appear normal. Affect normal.    Data Reviewed: I have personally reviewed following labs and imaging studies  CBG:  Recent Labs Lab 02/10/16 0729  GLUCAP 172*    CBC:  Recent Labs Lab 02/06/16 0358 02/07/16 1615 02/10/16 0323  WBC 17.6* 18.4* 17.7*  NEUTROABS  --  16.6*  --   HGB 12.5* 14.0 14.7  HCT 39.3 43.5 45.4  MCV 90.6 90.8 90.4  PLT 223 287 [id number]    Basic Metabolic Panel:  Recent Labs Lab 02/07/16 1615 02/08/16 0332 02/09/16 0329 02/10/16 0323 02/11/16 0331  NA 137 138 136 136 137  K 4.0 3.9 4.1 3.9 4.1  CL 95* 95* 94* 92* 91*  CO2 31 33* 32 36* 39*  GLUCOSE 236* 181* 148* 167* 171*  BUN 35* 38* 41* 43* 47*  CREATININE 0.77 0.85 0.80 0.72 0.79  CALCIUM 8.9 8.7* 8.5* 8.8* 8.6*    Recent Results (from the past 240 hour(s))  Urine culture     Status: Abnormal   Collection Time: 02/04/16  9:39 PM  Result Value Ref Range Status   Specimen Description URINE, CLEAN CATCH  Final   Special Requests NONE  Final   Culture MULTIPLE SPECIES PRESENT, SUGGEST RECOLLECTION (A)  Final   Report Status 02/06/2016 FINAL  Final  Blood culture (routine x 2)      Status: None   Collection Time: 02/04/16  9:44 PM  Result Value Ref Range Status   Specimen Description BLOOD LEFT WRIST  Final   Special Requests BOTTLES DRAWN AEROBIC AND ANAEROBIC 5CC  Final   Culture   Final    NO GROWTH 5 DAYS Performed at [facility]    Report Status 02/09/2016 FINAL  Final  MRSA PCR Screening     Status: None  Collection Time: 02/05/16  2:40 AM  Result Value Ref Range Status   MRSA by PCR NEGATIVE NEGATIVE Final    Comment:        The GeneXpert MRSA Assay (FDA approved for NASAL specimens only), is one component of a comprehensive MRSA colonization surveillance program. It is not intended to diagnose MRSA infection nor to guide or monitor treatment for MRSA infections.   Blood culture (routine x 2)     Status: None   Collection Time: 02/05/16  4:00 AM  Result Value Ref Range Status   Specimen Description BLOOD LEFT ARM  Final   Special Requests BOTTLES DRAWN AEROBIC ONLY 6 CC  Final   Culture   Final    NO GROWTH 5 DAYS Performed at [facility]    Report Status 02/10/2016 FINAL  Final  Culture, Urine     Status: None   Collection Time: 02/05/16  4:30 AM  Result Value Ref Range Status   Specimen Description URINE, RANDOM  Final   Special Requests NONE  Final   Culture NO GROWTH Performed at [facility]   Final   Report Status 02/06/2016 FINAL  Final     Liver Function Tests: No results for input(s): AST, ALT, ALKPHOS, BILITOT, PROT, ALBUMIN in the last 168 hours. No results for input(s): LIPASE, AMYLASE in the last 168 hours. No results for input(s): AMMONIA in the last 168 hours.  Cardiac Enzymes:  Recent Labs Lab 02/05/16 1601  TROPONINI <0.03      Studies: Dg Chest 2 View  Result Date: 02/11/2016 CLINICAL DATA:  Acute on chronic respiratory failure with hypoxia patient reports shortness of breath and weakness today. History of COPD, aortic insufficiency. EXAM: CHEST  2 VIEW COMPARISON:  Chest x-ray and  chest CT scan of February 09, 2016 FINDINGS: The lungs remain hyperinflated. There remains increased density at the left lung base compatible with pneumonia and a known pleural effusion. The heart and pulmonary vascularity are normal. There is calcification in the wall of the thoracic aorta. The mediastinum is normal in width. The bony thorax exhibits no acute abnormality. IMPRESSION: Persistent left lower lobe pneumonia and pleural effusion not greatly changed from the previous study. Underlying COPD. No CHF. Aortic atherosclerosis. Electronically Signed   By: David  Martinique M.D.   On: 02/11/2016 12:40   Dg Esophagus  Result Date: 02/10/2016 CLINICAL DATA:  Dysphagia.  Choked on foods this morning EXAM: ESOPHOGRAM/BARIUM SWALLOW TECHNIQUE: Single contrast examination was performed using  thin barium. FLUOROSCOPY TIME:  Fluoroscopy Time:  54 seconds Radiation Exposure Index (if provided by the fluoroscopic device): 3.8 mGy Number of Acquired Spot Images: 0 COMPARISON:  Chest CT 02/09/2016 FINDINGS: Fluoroscopic evaluation of swallowing demonstrates disruption of all primary esophageal peristaltic waves with stasis of contrast in the esophagus and occasional distal tertiary contractions. No fixed stricture, fold thickening or mass. IMPRESSION: Esophageal dysmotility.  No fixed stricture. Electronically Signed   By: Rolm Baptise M.D.   On: 02/10/2016 14:36    Scheduled Meds: . atorvastatin  40 mg Oral q1800  . clindamycin  300 mg Oral Q8H  . furosemide  40 mg Oral Daily  . guaiFENesin  600 mg Oral BID  . lactobacillus acidophilus & bulgar  2 tablet Oral TID WC  . levalbuterol  0.63 mg Nebulization BID  . levofloxacin (LEVAQUIN) IV  750 mg Intravenous Q24H  . mouth rinse  15 mL Mouth Rinse BID  . nystatin  5 mL Oral QID  .  pantoprazole  40 mg Oral Q0600  . polyethylene glycol  17 g Oral Daily  . senna-docusate  2 tablet Oral BID  . sucralfate  1 g Oral TID WC & HS  . terazosin  2 mg Oral QHS  .  tiotropium  18 mcg Inhalation Daily    Continuous Infusions:   Time spent: 25 min  [facility] Pager [phone]. If 7PM-7AM, please contact night-coverage at [email or web address], Office  [phone]  password [email or web address] 02/12/2016, 12:15 PM  LOS: 7 days

## 2016-02-12 NOTE — Progress Notes (Signed)
Speech Language Pathology Discharge Patient Details Name: HARVEST STANCO MRN: [id number] DOB: [birth year] Today's Date: 02/12/2016 Time: 2263-3354 SLP Time Calculation (min) (ACUTE ONLY): 20 min  Patient discharged from SLP services secondary to goals met and no further SLP needs identified.  Please see latest therapy progress note for current level of functioning and progress toward goals.    Progress and discharge plan discussed with patient and/or caregiver: Patient/Caregiver agrees with plan  GO     Eliam Snapp 02/12/2016, 11:36 AM

## 2016-02-12 NOTE — Progress Notes (Signed)
Daily Progress Note   Patient Name: Allen Beasley       Date: 02/12/2016 DOB: 05-26-1923  Age: 82 y.o. MRN#: [id number] Attending Physician: Oswald Hillock, MD Primary Care Physician: Geoffery Lyons, MD Admit Date: 02/04/2016  Reason for Consultation/Follow-up: Disposition  Subjective: Pt was resting comfortably in his bedside chair on our arrival. His respiratory status is unchanged from prior visits. His niece was now at the bedside, and eager to talk with [place].   Length of Stay: 7  Current Medications: Scheduled Meds:  . atorvastatin  40 mg Oral q1800  . clindamycin  300 mg Oral Q8H  . furosemide  40 mg Oral Daily  . guaiFENesin  600 mg Oral BID  . lactobacillus acidophilus & bulgar  2 tablet Oral TID WC  . levalbuterol  0.63 mg Nebulization BID  . levofloxacin (LEVAQUIN) IV  750 mg Intravenous Q24H  . mouth rinse  15 mL Mouth Rinse BID  . nystatin  5 mL Oral QID  . pantoprazole  40 mg Oral Q0600  . polyethylene glycol  17 g Oral Daily  . senna-docusate  2 tablet Oral BID  . sucralfate  1 g Oral TID WC & HS  . terazosin  2 mg Oral QHS  . tiotropium  18 mcg Inhalation Daily    Continuous Infusions:    PRN Meds: acetaminophen, gi cocktail, guaiFENesin-dextromethorphan, iopamidol, levalbuterol, magnesium hydroxide, ondansetron **OR** ondansetron (ZOFRAN) IV  Physical Exam    Constitutional: He is oriented to person, place, and time. He appears well-developed and well-nourished. No distress.  HENT:  Head: Normocephalic and atraumatic.  Mouth/Throat: Oropharynx is clear and moist.  Eyes: EOM are normal.  Neck: Normal range of motion.  Pulmonary/Chest:  Increased effort with prolonged conversation, noted to de-satruate to 78% with conversation.  Musculoskeletal: Normal range of motion.  Neurological: He is alert and  oriented to person, place, and time.  Skin: Skin is warm and dry.  Psychiatric: He has a normal mood and affect. His behavior is normal. Judgment and thought content normal.        Vital Signs: BP 137/61   Pulse 83   Temp 98.1 F (36.7 C) (Oral)   Resp 14   Ht 5\' 11"  (1.803 m)   Wt 84.4 kg (186 lb 1.1 oz)   SpO2 93%   BMI 25.95 kg/m  SpO2: SpO2: 93 % O2 Device: O2 Device: High Flow Nasal Cannula O2 Flow Rate: O2 Flow Rate (L/min): 12 L/min  Intake/output summary:   Intake/Output Summary (Last 24 hours) at 02/12/16 1458 Last data filed at 02/12/16 1336  Gross per 24 hour  Intake              470 ml  Output              600 ml  Net             -130 ml   LBM: Last BM Date: 02/10/16 Baseline Weight: Weight: 84.4 kg (186 lb 1.1 oz) Most recent weight: Weight: 84.4 kg (186 lb 1.1 oz)  Palliative Assessment/Data: PPS 50%    Flowsheet Rows   Flowsheet Row Most Recent Value  Intake Tab  Referral Department  Hospitalist  Unit at Time of Referral  ICU  Palliative Care Primary Diagnosis  Pulmonary  Date Notified  02/06/16  Palliative Care Type  New Palliative care  Reason for referral  Clarify Goals of Care  Date of Admission  02/04/16  Date first seen by Palliative Care  02/09/16  # of days Palliative referral response time  3 Day(s)  # of days IP prior to Palliative referral  2  Clinical Assessment  Psychosocial & Spiritual Assessment  Palliative Care Outcomes      Patient Active Problem List   Diagnosis Date Noted  . Necrotizing pneumonia ([place])   . Hypoxia   . Esophageal reflux   . Goals of care, counseling/discussion   . Palliative care encounter   . Acute on chronic respiratory failure with hypoxia ([place])   . Sepsis ([place]) 02/05/2016  . Acute respiratory failure with hypoxia ([place]) 02/05/2016  . BPH (benign prostatic hyperplasia) 02/05/2016  . Hyperglycemia 02/05/2016  . Thyroid nodule 09/15/2013  . CAP (community acquired pneumonia) 08/19/2012  . HTN  (hypertension) 08/05/2010  . Erectile dysfunction   . History of chest pain   . History of aortic insufficiency   . HYPERLIPIDEMIA 01/09/2010  . COPD with emphysema ([place]) 06/19/2007    Palliative Care Assessment & Plan   HPI: 82 y.o. male  with past medical history of COPD, aortic insufficiency, BPH, and HTN who was  admitted on 02/04/2016 with sepsis and COPD exacerbation with acute respiratory failure with hypoxia. Per Pulmonology note on 11/1, severe hypoxia is related to baseline centrilobular emphysema and acute necrotizing PNA in the LLL d/t aspiration PNA. Aspiration related to an esophageal motility. 3-4 week course of abx expected given the necrotizing nature of his PNA, and concern that it could take weeks for his oxygenation to return to baseline, and it may never completely return.  Assessment: As recorded in 11/1 note: Despite his high O2 requirement of 10-12L via HFNC, and his desaturation with talking, he continues to feel improved and reports improved appetite and exercise tolerance. He denies dyspnea, and seems unfazed by his desaturation during our extended conversation. We discussed the new pulmonary findings: the necrotizing nature of his PNA, duration of abx, and concern for very slow improvement (if any). He was thoughtful about these things, but seemed hopeful that, even at his current status, he could still enjoy his life and still move to [place] to be near family. Finally, his swallowing remains unchanged, though he has adjusted his diet for softer and easier to swallow food, which he has found helpful.   Overall, pt is clear on his goals: return home, and move to [place] in a few months to be closer to family. He understands that his COPD is not curable, and that his respiratory status may never significantly improve from his current state (and, if it does, it will be very slow). He continues to verbalize his plan to rely on family support to promote autonomy and independence  for as long as possible.   Today, we were able to meet with the patient and his niece to discuss disposition plan. We reviewed his pulmonary situation, as was discussed with him yesterday: the necrotizing nature of his PNA, duration of abx, and concern for very slow improvement (if any). His niece asked appropriate questions and was focused on what her role should be to adequately support him at home. We discussed his care needs: support with ADLs, ambulation, cues to rest and recover, and medication management. She  felt she was capable of these things, and reported a willingness to provide this level of care for as long as he required it (even planning to change her flight reservation if he required support longer than a month).   Recommendations/Plan:  Disposition -Per CM note, he is set up with adequate home O2 equipment. No equipment recommendations per PT -Niece willing and able to provide 24 hour home support; she does request teaching prior to discharge on his medications and safe ambulation techniques (which could be provided by care RN)  Goals of care -Pt is focused on maintaining independence and spending time with family. He is planning to relocate to [place] to be closer to family. In the interim, his niece will provide 24 hour assistance after discharge for as long as he requires it -Touched on code status very briefly on 10/30; brought up the possibility that, eventually, our interventions may be more detrimental than beneficial to his primary goal of quality time with his family, and that not doing aggressive interventions may be a more appropriate option. He was open to the conversation, though I doubt he will make any code status changes at present.   Goals of Care and Additional Recommendations:  Limitations on Scope of Treatment: Full Scope Treatment  Code Status:  Full code  Prognosis:   Unable to determine  Discharge Planning:  Home with [place] was  discussed with pt; plan to discuss with niece when she arrives on Thursday  Thank you for allowing the Palliative Medicine Team to assist in the care of this patient.   Time In: 1400 Time Out: 1440 Total Time 40 min Prolonged Time Billed  no      Greater than 50%  of this time was spent counseling and coordinating care related to the above assessment and plan.  Charlynn Court, NP Palliative Medicine Team Team Phone # [phone]

## 2016-02-12 NOTE — Progress Notes (Signed)
Physical Therapy Treatment Patient Details Name: Allen Beasley MRN: [id number] DOB: [birth year] Today's Date: 02/12/2016    History of Present Illness Allen Beasley is a 82 y.o. male with medical history significant of COPD/emphysema, HTN, HMD, aortic insufficiency; who presents with complaints of shortness of breath.  route with EMS patient was given 125mg  Solumedrol, 10mg  Albuterol, and a DuoNeb treatment with mild relief of symptoms. Upon admission 1-/25/17 to emergency department patient was evaluated and seen to be febrile up to 100.75F, heart rate is 93-114, respirations 19-38, O2 saturations as low as 78%, and blood pressure seen as high as 194/77. Small left side pleural effusion.    PT Comments    Pt OOB eating breakfast in recliner.  On high flow O2.  Assisted with amb a greater distance in hallway.  Progressing well.    Follow Up Recommendations  SNF;Supervision/Assistance - 24 hour     Equipment Recommendations  None recommended by PT    Recommendations for Other Services       Precautions / Restrictions Precautions Precautions: Fall Precaution Comments: on hiflow oxygen Restrictions Weight Bearing Restrictions: No    Mobility  Bed Mobility               General bed mobility comments: NT OOB in recliner  Transfers Overall transfer level: Needs assistance Equipment used: Rolling walker (2 wheeled) Transfers: Sit to/from Stand Sit to Stand: Supervision;Min guard;+2 safety/equipment         General transfer comment: impulsive, VC's for safety multiple lines.  VC's to reach back for chair prior to sit.    Ambulation/Gait Ambulation/Gait assistance: Min guard;+2 safety/equipment Ambulation Distance (Feet): 120 Feet Assistive device: Rolling walker (2 wheeled) Gait Pattern/deviations: Step-to pattern;Step-through pattern Gait velocity: decreased   General Gait Details: remained on high flow O2 10 lts sats avg 98%.  Tolerated increased distance.      Stairs            Wheelchair Mobility    Modified Rankin (Stroke Patients Only)       Balance                                    Cognition Arousal/Alertness: Awake/alert Behavior During Therapy: WFL for tasks assessed/performed Overall Cognitive Status: Within Functional Limits for tasks assessed                      Exercises      General Comments        Pertinent Vitals/Pain Pain Assessment: No/denies pain    Home Living                      Prior Function            PT Goals (current goals can now be found in the care plan section) Progress towards PT goals: Progressing toward goals    Frequency    Min 3X/week      PT Plan Current plan remains appropriate    Co-evaluation             End of Session Equipment Utilized During Treatment: Gait belt;Oxygen Activity Tolerance: Patient tolerated treatment well;Patient limited by fatigue Patient left: in chair;with call bell/phone within reach;with chair alarm set     Time: [id number] PT Time Calculation (min) (ACUTE ONLY): 24 min  Charges:  $Gait Training: 8-22 mins $Therapeutic Activity: 8-22 mins  G Codes:      Rica Koyanagi  PTA [facility]  Acute  Rehab Pager      [phone]

## 2016-02-12 NOTE — Progress Notes (Signed)
Speech Language Pathology Treatment: Dysphagia  Patient Details Name: JHOAN SCHMIEDER MRN: [id number] DOB: 1924-03-29 Today's Date: 02/12/2016 Time: 3845-3646 SLP Time Calculation (min) (ACUTE ONLY): 20 min  Assessment / Plan / Recommendation Clinical Impression  Skilled treatment session provided with skilled observation of regular diet textures and thin liquids. Pt able to recall swallow compensatory strategies independently. Education completed with pt and nursing. ST to sign off.    HPI HPI: 82 yo male adm to [facility] 10/25 with hypoxia, CAP.  Pt with nausea/vomiting and abdomen pain prior to admission.  Imaging studies show pleural effusion, left more than right.  Pt reports difficulty with solids.        SLP Plan  All goals met (Edcuation completed)     Recommendations  Diet recommendations: Regular;Thin liquid Liquids provided via: Straw;Cup Medication Administration:  (defer to pt) Supervision: Patient able to self feed Compensations: Slow rate;Small sips/bites Postural Changes and/or Swallow Maneuvers: Seated upright 90 degrees;Upright 30-60 min after meal                Oral Care Recommendations: Oral care BID Follow up Recommendations: None Plan: All goals met (Edcuation completed)       GO                Barbara Ahart B. Rutherford Nail M.S., [place] Speech-Language Pathologist

## 2016-02-12 NOTE — Progress Notes (Signed)
Name: Allen Beasley MRN: [id number] DOB: 1923-05-12    ADMISSION DATE:  02/04/2016 CONSULTATION DATE:  10/31  REFERRING MD :  Karleen Hampshire   CHIEF COMPLAINT:  Refractory hypoxia   BRIEF PATIENT DESCRIPTION:  31 yom w/ known h/o COPD who doesn't take his prescribed home oxygen admitted on 10/25 with acute on chronci respiratory failure with hypoxemia in setting of necrotizing pneumonia in the left lower lobe.    SIGNIFICANT EVENTS  Bedside swallow 10/30: odynophagia, refluxing Consulted and seen by palliative , remains full code  STUDIES:  CT chest 10/30: negative for PE. Trace right effusion, small to mod left effusion w/ possible loculation, emphysema changes, LLL necrotic changes, Dense Bilateral consolidations R>L.  ECHO 10/30: EF Q000111Q, grade I diastolic dyfxn,  [id number] barium swallow> no esophageal peristalsis   SUBJECTIVE:  SOB post coughing while eating. Note O2 sat site changed to ear with sats 98%. Wean O2 as tolerated.   VITAL SIGNS: Temp:  [97.4 F (36.3 C)-98.5 F (36.9 C)] 98.5 F (36.9 C) (11/02 0800) Pulse Rate:  [83-98] 83 (11/01 1800) Resp:  [14-26] 14 (11/02 0400) BP: (123-137)/(56-85) 137/61 (11/02 0400) SpO2:  [91 %-94 %] 93 % (11/02 0855)  PHYSICAL EXAMINATION: General:  Resting comfortably in chair. Eating and started choking and coughing HENT: NCAT OP clear PULM: Mild rhonchi , no wheezing, good air movement, diminished left base Note O2 sat site changed to ear with sats 98%. Wean O2 as tolerated.  CV: RRR, no mgr GI: BS+, soft, nontender MSK: normal bulk and tone Derm: some extensor surface bruising Neuro: A&Ox4, maew    Recent Labs Lab 02/09/16 0329 02/10/16 0323 02/11/16 0331  NA 136 136 137  K 4.1 3.9 4.1  CL 94* 92* 91*  CO2 32 36* 39*  BUN 41* 43* 47*  CREATININE 0.80 0.72 0.79  GLUCOSE 148* 167* 171*    Recent Labs Lab 02/06/16 0358 02/07/16 1615 02/10/16 0323  HGB 12.5* 14.0 14.7  HCT 39.3 43.5 45.4  WBC 17.6* 18.4*  17.7*  PLT 223 287 248   ABG    Component Value Date/Time   PHART 7.480 (H) 02/09/2016 1826   PCO2ART 50.7 (H) 02/09/2016 1826   PO2ART 56.6 (L) 02/09/2016 1826   HCO3 37.4 (H) 02/09/2016 1826   TCO2 25 08/19/2012 1336   O2SAT 88.6 02/09/2016 1826   Dg Chest 2 View  Result Date: 02/11/2016 CLINICAL DATA:  Acute on chronic respiratory failure with hypoxia patient reports shortness of breath and weakness today. History of COPD, aortic insufficiency. EXAM: CHEST  2 VIEW COMPARISON:  Chest x-ray and chest CT scan of February 09, 2016 FINDINGS: The lungs remain hyperinflated. There remains increased density at the left lung base compatible with pneumonia and a known pleural effusion. The heart and pulmonary vascularity are normal. There is calcification in the wall of the thoracic aorta. The mediastinum is normal in width. The bony thorax exhibits no acute abnormality. IMPRESSION: Persistent left lower lobe pneumonia and pleural effusion not greatly changed from the previous study. Underlying COPD. No CHF. Aortic atherosclerosis. Electronically Signed   By: David  Martinique M.D.   On: 02/11/2016 12:40   Dg Esophagus  Result Date: 02/10/2016 CLINICAL DATA:  Dysphagia.  Choked on foods this morning EXAM: ESOPHOGRAM/BARIUM SWALLOW TECHNIQUE: Single contrast examination was performed using  thin barium. FLUOROSCOPY TIME:  Fluoroscopy Time:  54 seconds Radiation Exposure Index (if provided by the fluoroscopic device): 3.8 mGy Number of Acquired Spot Images: 0 COMPARISON:  Chest  CT 02/09/2016 FINDINGS: Fluoroscopic evaluation of swallowing demonstrates disruption of all primary esophageal peristaltic waves with stasis of contrast in the esophagus and occasional distal tertiary contractions. No fixed stricture, fold thickening or mass. IMPRESSION: Esophageal dysmotility.  No fixed stricture. Electronically Signed   By: Rolm Baptise M.D.   On: 02/10/2016 14:36   Images personally reviewed, necrotizing pneumonia  left lower lobe on CT, pleural effusion (small present)  ASSESSMENT / PLAN:  Acute on Chronic Hypoxic Respiratory Failure  Aspiration Pneumonia> Necrotizing  COPD, not in exacerbation Small parapneumonic effusions Severe esophageal dysmotility w/ reflux Grade I diastolic dysfxn Odynophagia > candidal esophagitis?  Discussion.  Severe hypoxemia explained by baseline centrilobular emphysema and acute necrotizing pneumonia in left lower lobe due to aspiration pneumonia.  He is aspirating due to lack of esophageal motility.  I suspect it will take weeks for his oxygenation to return to baseline.  It may never completely return to baseline. Despite 13 /ml [place] he will desaturate with coughing and exertion. Note O2 sat site changed to ear with sats 98%. Wean O2 as tolerated.   Plan Cont to wean O2 for sats > 88% Cont spiriva and rescue BDs Agree w/ SLP recs re: reflux precautions Continue levaquin and clindamycin> he will need a 3-4 week course of antibiotics given the necrotizing nature of his pneumonia Add probiotics given broad antibiotic coverage Consider GI evaluation> as he is choking with eating Continue PPI Repeated 2 view CXR 11/1, effusion NSC, thoracentesis is a consideration but benefits may not outweigh risk.   Richardson Landry Jazzalyn Loewenstein ACNP [name] PCCM Pager [phone] till 3 pm If no answer page [phone] 02/12/2016, 10:13 AM

## 2016-02-13 LAB — CBC
HEMATOCRIT: 42.4 % (ref 39.0–52.0)
Hemoglobin: 13.4 g/dL (ref 13.0–17.0)
MCH: 28.5 pg (ref 26.0–34.0)
MCHC: 31.6 g/dL (ref 30.0–36.0)
MCV: 90 fL (ref 78.0–100.0)
Platelets: 240 10*3/uL (ref 150–400)
RBC: 4.71 MIL/uL (ref 4.22–5.81)
RDW: 13.2 % (ref 11.5–15.5)
WBC: 15.1 10*3/uL — ABNORMAL HIGH (ref 4.0–10.5)

## 2016-02-13 LAB — BASIC METABOLIC PANEL
Anion gap: 7 (ref 5–15)
BUN: 36 mg/dL — AB (ref 6–20)
CALCIUM: 8.2 mg/dL — AB (ref 8.9–10.3)
CO2: 37 mmol/L — AB (ref 22–32)
CREATININE: 0.71 mg/dL (ref 0.61–1.24)
Chloride: 89 mmol/L — ABNORMAL LOW (ref 101–111)
GFR calc non Af Amer: >60 mL/min (ref >60)
Glucose, Bld: 166 mg/dL — ABNORMAL HIGH (ref 65–99)
Potassium: 3.7 mmol/L (ref 3.5–5.1)
SODIUM: 133 mmol/L — AB (ref 135–145)

## 2016-02-13 NOTE — Progress Notes (Signed)
Date:  February 13, 2016 Chart reviewed for concurrent status and case management needs. Will continue to follow the patient for status change: Discharge Planning: following for needs:  Referral to [facility] done per note from PA. Expected discharge date: [id number] Rhonda Davis, BSN, [place], [place]

## 2016-02-13 NOTE — Progress Notes (Signed)
Name: Allen Beasley MRN: [id number] DOB: [birth year]    ADMISSION DATE:  02/04/2016 CONSULTATION DATE:  10/31  REFERRING MD :  Karleen Hampshire   CHIEF COMPLAINT:  Refractory hypoxia   BRIEF PATIENT DESCRIPTION:  41 yom w/ known h/o COPD who doesn't take his prescribed home oxygen admitted on 10/25 with acute on chronci respiratory failure with hypoxemia in setting of necrotizing pneumonia in the left lower lobe.    SIGNIFICANT EVENTS  Bedside swallow 10/30: odynophagia, refluxing Consulted and seen by palliative , remains full code  STUDIES:  CT chest 10/30: negative for PE. Trace right effusion, small to mod left effusion w/ possible loculation, emphysema changes, LLL necrotic changes, Dense Bilateral consolidations R>L.  ECHO 10/30: EF Q000111Q, grade I diastolic dyfxn,  [id number] barium swallow> no esophageal peristalsis   SUBJECTIVE:  He appears more comfortable   VITAL SIGNS: Temp:  [96.7 F (35.9 C)-98.3 F (36.8 C)] 97.9 F (36.6 C) (11/03 0800) Resp:  [15-29] 15 (11/03 0800) BP: (109-194)/(51-91) 133/63 (11/03 0800) SpO2:  [82 %-100 %] 92 % (11/03 0926)  PHYSICAL EXAMINATION: General:  Resting comfortably in bed.  HENT: NCAT OP clear PULM: Mild rhonchi , no wheezing, good air movement, . Wean O2 as tolerated. CV: RRR, no mgr GI: BS+, soft, nontender MSK: normal bulk and tone Derm: some extensor surface bruising Neuro: A&Ox4, maew    Recent Labs Lab 02/10/16 0323 02/11/16 0331 02/13/16 0316  NA 136 137 133*  K 3.9 4.1 3.7  CL 92* 91* 89*  CO2 36* 39* 37*  BUN 43* 47* 36*  CREATININE 0.72 0.79 0.71  GLUCOSE 167* 171* 166*    Recent Labs Lab 02/07/16 1615 02/10/16 0323 02/13/16 0316  HGB 14.0 14.7 13.4  HCT 43.5 45.4 42.4  WBC 18.4* 17.7* 15.1*  PLT 287 248 240   ABG    Component Value Date/Time   PHART 7.480 (H) 02/09/2016 1826   PCO2ART 50.7 (H) 02/09/2016 1826   PO2ART 56.6 (L) 02/09/2016 1826   HCO3 37.4 (H) 02/09/2016 1826   TCO2 25  08/19/2012 1336   O2SAT 88.6 02/09/2016 1826   Dg Chest 2 View  Result Date: 02/11/2016 CLINICAL DATA:  Acute on chronic respiratory failure with hypoxia patient reports shortness of breath and weakness today. History of COPD, aortic insufficiency. EXAM: CHEST  2 VIEW COMPARISON:  Chest x-ray and chest CT scan of February 09, 2016 FINDINGS: The lungs remain hyperinflated. There remains increased density at the left lung base compatible with pneumonia and a known pleural effusion. The heart and pulmonary vascularity are normal. There is calcification in the wall of the thoracic aorta. The mediastinum is normal in width. The bony thorax exhibits no acute abnormality. IMPRESSION: Persistent left lower lobe pneumonia and pleural effusion not greatly changed from the previous study. Underlying COPD. No CHF. Aortic atherosclerosis. Electronically Signed   By: David  Martinique M.D.   On: 02/11/2016 12:40   Images personally reviewed, necrotizing pneumonia left lower lobe on CT, pleural effusion (small present)  ASSESSMENT / PLAN:  Acute on Chronic Hypoxic Respiratory Failure  Aspiration Pneumonia> Necrotizing  COPD, not in exacerbation Small parapneumonic effusions Severe esophageal dysmotility w/ reflux Grade I diastolic dysfxn Odynophagia > candidal esophagitis?  Discussion.  Severe hypoxemia explained by baseline centrilobular emphysema and acute necrotizing pneumonia in left lower lobe due to aspiration pneumonia.  He is aspirating due to lack of esophageal motility.  I suspect it will take weeks for his oxygenation to return to baseline.  It may never completely return to baseline. Despite 13 /ml [place] he will desaturate with coughing and exertion. Note O2 sat site changed to ear with sats 98%. Wean O2 as tolerated.   Plan Cont to wean O2 for sats > 88% Cont spiriva and rescue BDs Agree w/ SLP recs re: reflux precautions as noted Continue levaquin and clindamycin> he will need a 3-4 week course of  antibiotics given the necrotizing nature of his pneumonia Add probiotics given broad antibiotic coverage Consider GI evaluation> as he is choking with eating Continue PPI Repeated 2 view CXR 11/1, effusion NSC, thoracentesis is a consideration but benefits may not outweigh risk. He would most likely benefit from [facility] with Pulmonary rehab focus. Nutrition, excerise, oxygen weaning in controlled environment. He does not need to be in ICU/SDU at this time.   [name] ACNP [name] PCCM Pager [phone] till 3 pm If no answer page [phone] 02/13/2016, 9:58 AM

## 2016-02-13 NOTE — Progress Notes (Signed)
[facility]  PROGRESS NOTE  Allen Beasley [id number] DOB: Oct 24, 1923 DOA: 02/04/2016 PCP: Geoffery Lyons, MD   Brief HPI:   82 year old male with a history of COPD/emphysema, hypertension, aortic insufficiency who was admitted with acute on chronic hypoxic respiratory failure, initially patient required BiPAP and was later transitioned to 6 L of nasal cannula to maintain O2 sats greater than 92%. Patient started on empiric antibiotics, IV Solu-Medrol and admitted to stepdown. Patient was seen by critical care medicine.   Subjective   This morning patient is breathing better. Denies shortness of breath.    Assessment/Plan:     1. Acute on chronic hypoxic respiratory failure- improved, multifactorial from underlying emphysema, aspiration pneumonia. Prednisone stopped per [facility] M, continue Levaquin and clindamycin. He will need 2-4 weeks course of antibiotics due to underlying necrotizing pneumonia. Repeat chest x-ray this morning shows persistent left lower lobe pneumonia.Patient will be transferred to medical floor. 2. Odynophagia- ? component of candida esophagitis, started on nystatin 500,000 units oral 4 times daily. Continue Carafate, Protonix. 3. BPH- continue Hytrin 4. Hypertension- blood pressure controlled 5. Moderate bilateral pleural effusions- patient started on Lasix, echogram showed LVH, EF 123456, grade 1 diastolic dysfunction. 6. Hematuria- UA on 02/06/2016 showed significant RBCs, unclear etiology. No hydronephrosis on CT abdomen and pelvis. Urine cultures are negative. Lovenox has been stopped. Might need urology evaluation as outpatient. Hemoglobin has been stable in the hospital     DVT prophylaxis: SCDs, Lovenox was stopped due to hematuria on 02/06/2016.  Code Status: Full code  Family Communication: No family at bedside   Disposition Plan:  Pt lives by himself and apparently took himself off his oxygen,.  Family wants him to go to ALF.  Palliative  care consulted for goals of care, recommendations to continue the same treatment.  Case manager consult for home health needs.  PT recommended SNF, he is currently resistant to the idea of going to rehab. He wants to go home and her niece from [place] is coming to help him.   Consultants:  [facility] M  Procedures:  None  Antibiotics:   Anti-infectives    Start     Dose/Rate Route Frequency Ordered Stop   02/10/16 1400  clindamycin (CLEOCIN) capsule 300 mg     300 mg Oral Every 8 hours 02/10/16 1138     02/06/16 1700  levofloxacin (LEVAQUIN) IVPB 750 mg     750 mg 100 mL/hr over 90 Minutes Intravenous Every 24 hours 02/06/16 1632     02/05/16 2200  levofloxacin (LEVAQUIN) IVPB 750 mg  Status:  Discontinued     750 mg 100 mL/hr over 90 Minutes Intravenous Daily at bedtime 02/05/16 0538 02/05/16 0946   02/05/16 2200  levofloxacin (LEVAQUIN) tablet 750 mg  Status:  Discontinued     750 mg Oral Daily at bedtime 02/05/16 1414 02/06/16 1632   02/05/16 1200  vancomycin (VANCOCIN) IVPB 750 mg/150 ml premix  Status:  Discontinued     750 mg 150 mL/hr over 60 Minutes Intravenous Every 12 hours 02/05/16 0538 02/05/16 1414   02/05/16 0600  aztreonam (AZACTAM) 1 g in dextrose 5 % 50 mL IVPB  Status:  Discontinued     1 g 100 mL/hr over 30 Minutes Intravenous Every 8 hours 02/05/16 0538 02/05/16 1414   02/04/16 2145  levofloxacin (LEVAQUIN) IVPB 750 mg     750 mg 100 mL/hr over 90 Minutes Intravenous  Once 02/04/16 2140 02/04/16 2358   02/04/16 2145  aztreonam (AZACTAM) 2 g  in dextrose 5 % 50 mL IVPB     2 g 100 mL/hr over 30 Minutes Intravenous  Once 02/04/16 2140 02/04/16 2228   02/04/16 2145  vancomycin (VANCOCIN) IVPB 1000 mg/200 mL premix     1,000 mg 200 mL/hr over 60 Minutes Intravenous  Once 02/04/16 2140 02/05/16 0142       Objective   Vitals:   02/13/16 0000 02/13/16 0323 02/13/16 0400 02/13/16 0800  BP: (!) 141/51  (!) 132/56 133/63  Pulse:      Resp: (!) 26  15 15   Temp:   98.1 F (36.7 C)  97.9 F (36.6 C)  TempSrc:  Oral  Oral  SpO2: 92%  94% 93%  Weight:      Height:        Intake/Output Summary (Last 24 hours) at 02/13/16 0925 Last data filed at 02/13/16 0457  Gross per 24 hour  Intake              240 ml  Output              475 ml  Net             -235 ml   Filed Weights   02/05/16 0215 02/05/16 0500  Weight: 84.4 kg (186 lb 1.1 oz) 84.4 kg (186 lb 1.1 oz)     Physical Examination:  General exam: Appears calm and comfortable. Respiratory system: Decreased breath sounds bilaterally, mild tachypnea Cardiovascular system:  RRR. No  murmurs, rubs, gallops. No pedal edema. GI system: Abdomen is nondistended, soft and nontender. No organomegaly.  Central nervous system. No focal neurological deficits. 5 x 5 power in all extremities. Skin: No rashes, lesions or ulcers. Psychiatry: Alert, oriented x 3.Judgement and insight appear normal. Affect normal.    Data Reviewed: I have personally reviewed following labs and imaging studies  CBG:  Recent Labs Lab 02/10/16 0729  GLUCAP 172*    CBC:  Recent Labs Lab 02/07/16 1615 02/10/16 0323 02/13/16 0316  WBC 18.4* 17.7* 15.1*  NEUTROABS 16.6*  --   --   HGB 14.0 14.7 13.4  HCT 43.5 45.4 42.4  MCV 90.8 90.4 90.0  PLT 287 248 [id number]    Basic Metabolic Panel:  Recent Labs Lab 02/08/16 0332 02/09/16 0329 02/10/16 0323 02/11/16 0331 02/13/16 0316  NA 138 136 136 137 133*  K 3.9 4.1 3.9 4.1 3.7  CL 95* 94* 92* 91* 89*  CO2 33* 32 36* 39* 37*  GLUCOSE 181* 148* 167* 171* 166*  BUN 38* 41* 43* 47* 36*  CREATININE 0.85 0.80 0.72 0.79 0.71  CALCIUM 8.7* 8.5* 8.8* 8.6* 8.2*    Recent Results (from the past 240 hour(s))  Urine culture     Status: Abnormal   Collection Time: 02/04/16  9:39 PM  Result Value Ref Range Status   Specimen Description URINE, CLEAN CATCH  Final   Special Requests NONE  Final   Culture MULTIPLE SPECIES PRESENT, SUGGEST RECOLLECTION (A)  Final   Report  Status 02/06/2016 FINAL  Final  Blood culture (routine x 2)     Status: None   Collection Time: 02/04/16  9:44 PM  Result Value Ref Range Status   Specimen Description BLOOD LEFT WRIST  Final   Special Requests BOTTLES DRAWN AEROBIC AND ANAEROBIC 5CC  Final   Culture   Final    NO GROWTH 5 DAYS Performed at [facility]    Report Status 02/09/2016 FINAL  Final  MRSA PCR Screening  Status: None   Collection Time: 02/05/16  2:40 AM  Result Value Ref Range Status   MRSA by PCR NEGATIVE NEGATIVE Final    Comment:        The GeneXpert MRSA Assay (FDA approved for NASAL specimens only), is one component of a comprehensive MRSA colonization surveillance program. It is not intended to diagnose MRSA infection nor to guide or monitor treatment for MRSA infections.   Blood culture (routine x 2)     Status: None   Collection Time: 02/05/16  4:00 AM  Result Value Ref Range Status   Specimen Description BLOOD LEFT ARM  Final   Special Requests BOTTLES DRAWN AEROBIC ONLY 6 CC  Final   Culture   Final    NO GROWTH 5 DAYS Performed at [facility]    Report Status 02/10/2016 FINAL  Final  Culture, Urine     Status: None   Collection Time: 02/05/16  4:30 AM  Result Value Ref Range Status   Specimen Description URINE, RANDOM  Final   Special Requests NONE  Final   Culture NO GROWTH Performed at [facility]   Final   Report Status 02/06/2016 FINAL  Final     Liver Function Tests: No results for input(s): AST, ALT, ALKPHOS, BILITOT, PROT, ALBUMIN in the last 168 hours. No results for input(s): LIPASE, AMYLASE in the last 168 hours. No results for input(s): AMMONIA in the last 168 hours.  Cardiac Enzymes: No results for input(s): CKTOTAL, CKMB, CKMBINDEX, TROPONINI in the last 168 hours.    Studies: Dg Chest 2 View  Result Date: 02/11/2016 CLINICAL DATA:  Acute on chronic respiratory failure with hypoxia patient reports shortness of breath and weakness  today. History of COPD, aortic insufficiency. EXAM: CHEST  2 VIEW COMPARISON:  Chest x-ray and chest CT scan of February 09, 2016 FINDINGS: The lungs remain hyperinflated. There remains increased density at the left lung base compatible with pneumonia and a known pleural effusion. The heart and pulmonary vascularity are normal. There is calcification in the wall of the thoracic aorta. The mediastinum is normal in width. The bony thorax exhibits no acute abnormality. IMPRESSION: Persistent left lower lobe pneumonia and pleural effusion not greatly changed from the previous study. Underlying COPD. No CHF. Aortic atherosclerosis. Electronically Signed   By: David  Martinique M.D.   On: 02/11/2016 12:40    Scheduled Meds: . atorvastatin  40 mg Oral q1800  . clindamycin  300 mg Oral Q8H  . furosemide  40 mg Oral Daily  . guaiFENesin  600 mg Oral BID  . lactobacillus acidophilus & bulgar  2 tablet Oral TID WC  . levalbuterol  0.63 mg Nebulization BID  . levofloxacin (LEVAQUIN) IV  750 mg Intravenous Q24H  . mouth rinse  15 mL Mouth Rinse BID  . nystatin  5 mL Oral QID  . pantoprazole  40 mg Oral Q0600  . polyethylene glycol  17 g Oral Daily  . senna-docusate  2 tablet Oral BID  . sucralfate  1 g Oral TID WC & HS  . terazosin  2 mg Oral QHS  . tiotropium  18 mcg Inhalation Daily    Continuous Infusions:   Time spent: 25 min  [facility] Pager [phone]. If 7PM-7AM, please contact night-coverage at [email or web address], Office  [phone]  password [email or web address] 02/13/2016, 9:25 AM  LOS: 8 days

## 2016-02-13 NOTE — Progress Notes (Signed)
Physical Therapy Treatment Patient Details Name: Allen Beasley MRN: [id number] DOB: [birth year] Today's Date: 02/13/2016    History of Present Illness Allen Beasley is a 82 y.o. male with medical history significant of COPD/emphysema, HTN, HMD, aortic insufficiency; who presents with complaints of shortness of breath.  route with EMS patient was given 125mg  Solumedrol, 10mg  Albuterol, and a DuoNeb treatment with mild relief of symptoms. Upon admission 1-/25/17 to emergency department patient was evaluated and seen to be febrile up to 100.24F, heart rate is 93-114, respirations 19-38, O2 saturations as low as 78%, and blood pressure seen as high as 194/77. Small left side pleural effusion.    PT Comments    Pt in bed at rest 90% on 6 lts high flow nasal. Assisted with amb pt required 10 lts to achieve above 88%. Extended long sitting rest break to recover, decreased to 8lts and notified RN.  Follow Up Recommendations  SNF     Equipment Recommendations  None recommended by PT    Recommendations for Other Services       Precautions / Restrictions Precautions Precautions: Fall Precaution Comments: on hiflow oxygen Restrictions Weight Bearing Restrictions: No    Mobility  Bed Mobility Overal bed mobility: Needs Assistance Bed Mobility: Supine to Sit     Supine to sit: Min guard     General bed mobility comments: increased time and use of rail  Transfers Overall transfer level: Needs assistance Equipment used: Rolling walker (2 wheeled) Transfers: Sit to/from Stand Sit to Stand: Supervision;Min guard;+2 safety/equipment         General transfer comment: impulsive, VC's for safety multiple lines.  VC's to reach back for chair prior to sit.  also assisted on/off BSC  Ambulation/Gait Ambulation/Gait assistance: Min assist;+2 safety/equipment Ambulation Distance (Feet): 105 Feet Assistive device: Rolling walker (2 wheeled)   Gait velocity: decreased   General Gait  Details: several standing rest breaks to maintain O2 sats above 88%.     Stairs            Wheelchair Mobility    Modified Rankin (Stroke Patients Only)       Balance                                    Cognition Arousal/Alertness: Awake/alert Behavior During Therapy: WFL for tasks assessed/performed Overall Cognitive Status: Within Functional Limits for tasks assessed                      Exercises      General Comments        Pertinent Vitals/Pain      Home Living                      Prior Function            PT Goals (current goals can now be found in the care plan section) Progress towards PT goals: Progressing toward goals    Frequency    Min 3X/week      PT Plan Current plan remains appropriate    Co-evaluation             End of Session Equipment Utilized During Treatment: Gait belt;Oxygen Activity Tolerance: Patient tolerated treatment well;Patient limited by fatigue Patient left: in chair;with call bell/phone within reach;with chair alarm set     Time: 1435-1515 PT Time Calculation (min) (ACUTE ONLY): 40 min  Charges:  $Gait Training: 8-22 mins $Therapeutic Activity: 23-37 mins                    G Codes:      Rica Koyanagi  PTA [facility]  Acute  Rehab Pager      [phone]

## 2016-02-14 MED ORDER — TEMAZEPAM 15 MG PO CAPS
15.0000 mg | ORAL_CAPSULE | Freq: Every evening | ORAL | Status: DC | PRN
  Administered 2016-02-14 – 2016-02-16 (×3): 15 mg via ORAL
  Filled 2016-02-14 (×3): qty 1

## 2016-02-14 NOTE — Progress Notes (Signed)
[facility]  PROGRESS NOTE  Allen Beasley [id number] DOB: 08-19-1923 DOA: 02/04/2016 PCP: Geoffery Lyons, MD   Brief HPI:   82 year old male with a history of COPD/emphysema, hypertension, aortic insufficiency who was admitted with acute on chronic hypoxic respiratory failure, initially patient required BiPAP and was later transitioned to 6 L of nasal cannula to maintain O2 sats greater than 92%. Patient started on empiric antibiotics, IV Solu-Medrol and admitted to stepdown. Patient was seen by critical care medicine.   Subjective   This morning patient is breathing better.    Assessment/Plan:     1. Acute on chronic hypoxic respiratory failure- improved, multifactorial from underlying emphysema, aspiration pneumonia. Prednisone stopped per [facility] M, continue Levaquin and clindamycin. He will need 2-4 weeks course of antibiotics due to underlying necrotizing pneumonia. Repeat chest x-ray this morning shows persistent left lower lobe pneumonia.Patient will need 4 weeks of total antibiotics. And LTAC placement as per [facility] M. 2. Odynophagia- ? component of candida esophagitis, started on nystatin 500,000 units oral 4 times daily. Continue Carafate, Protonix. 3. BPH- continue Hytrin 4. Hypertension- blood pressure controlled 5. Moderate bilateral pleural effusions- patient started on Lasix, echogram showed LVH, EF 123456, grade 1 diastolic dysfunction. 6. Hematuria- UA on 02/06/2016 showed significant RBCs, unclear etiology. No hydronephrosis on CT abdomen and pelvis. Urine cultures are negative. Lovenox has been stopped. Might need urology evaluation as outpatient. Hemoglobin has been stable in the hospital     DVT prophylaxis: SCDs, Lovenox was stopped due to hematuria on 02/06/2016.  Code Status: Full code  Family Communication: No family at bedside   Disposition Plan:  Patient to go to LTAC  Consultants:  PCCM  Procedures:  None  Antibiotics:   Anti-infectives     Start     Dose/Rate Route Frequency Ordered Stop   02/10/16 1400  clindamycin (CLEOCIN) capsule 300 mg     300 mg Oral Every 8 hours 02/10/16 1138     02/06/16 1700  levofloxacin (LEVAQUIN) IVPB 750 mg     750 mg 100 mL/hr over 90 Minutes Intravenous Every 24 hours 02/06/16 1632     02/05/16 2200  levofloxacin (LEVAQUIN) IVPB 750 mg  Status:  Discontinued     750 mg 100 mL/hr over 90 Minutes Intravenous Daily at bedtime 02/05/16 0538 02/05/16 0946   02/05/16 2200  levofloxacin (LEVAQUIN) tablet 750 mg  Status:  Discontinued     750 mg Oral Daily at bedtime 02/05/16 1414 02/06/16 1632   02/05/16 1200  vancomycin (VANCOCIN) IVPB 750 mg/150 ml premix  Status:  Discontinued     750 mg 150 mL/hr over 60 Minutes Intravenous Every 12 hours 02/05/16 0538 02/05/16 1414   02/05/16 0600  aztreonam (AZACTAM) 1 g in dextrose 5 % 50 mL IVPB  Status:  Discontinued     1 g 100 mL/hr over 30 Minutes Intravenous Every 8 hours 02/05/16 0538 02/05/16 1414   02/04/16 2145  levofloxacin (LEVAQUIN) IVPB 750 mg     750 mg 100 mL/hr over 90 Minutes Intravenous  Once 02/04/16 2140 02/04/16 2358   02/04/16 2145  aztreonam (AZACTAM) 2 g in dextrose 5 % 50 mL IVPB     2 g 100 mL/hr over 30 Minutes Intravenous  Once 02/04/16 2140 02/04/16 2228   02/04/16 2145  vancomycin (VANCOCIN) IVPB 1000 mg/200 mL premix     1,000 mg 200 mL/hr over 60 Minutes Intravenous  Once 02/04/16 2140 02/05/16 0142       Objective  Vitals:   02/14/16 0616 02/14/16 0703 02/14/16 1240 02/14/16 1241  BP:  134/68    Pulse:  81    Resp:  20    Temp:  98.1 F (36.7 C)    TempSrc:  Oral    SpO2: 93% 90% 93% 92%  Weight:      Height:        Intake/Output Summary (Last 24 hours) at 02/14/16 1419 Last data filed at 02/14/16 0932  Gross per 24 hour  Intake              480 ml  Output              250 ml  Net              230 ml   Filed Weights   02/05/16 0215 02/05/16 0500  Weight: 84.4 kg (186 lb 1.1 oz) 84.4 kg (186 lb  1.1 oz)     Physical Examination:  General exam: Appears calm and comfortable. Respiratory system: Decreased breath sounds bilaterally, mild tachypnea Cardiovascular system:  RRR. No  murmurs, rubs, gallops. No pedal edema. GI system: Abdomen is nondistended, soft and nontender. No organomegaly.  Central nervous system. No focal neurological deficits. 5 x 5 power in all extremities. Skin: No rashes, lesions or ulcers. Psychiatry: Alert, oriented x 3.Judgement and insight appear normal. Affect normal.    Data Reviewed: I have personally reviewed following labs and imaging studies  CBG:  Recent Labs Lab 02/10/16 0729  GLUCAP 172*    CBC:  Recent Labs Lab 02/07/16 1615 02/10/16 0323 02/13/16 0316  WBC 18.4* 17.7* 15.1*  NEUTROABS 16.6*  --   --   HGB 14.0 14.7 13.4  HCT 43.5 45.4 42.4  MCV 90.8 90.4 90.0  PLT 287 248 [id number]    Basic Metabolic Panel:  Recent Labs Lab 02/08/16 0332 02/09/16 0329 02/10/16 0323 02/11/16 0331 02/13/16 0316  NA 138 136 136 137 133*  K 3.9 4.1 3.9 4.1 3.7  CL 95* 94* 92* 91* 89*  CO2 33* 32 36* 39* 37*  GLUCOSE 181* 148* 167* 171* 166*  BUN 38* 41* 43* 47* 36*  CREATININE 0.85 0.80 0.72 0.79 0.71  CALCIUM 8.7* 8.5* 8.8* 8.6* 8.2*    Recent Results (from the past 240 hour(s))  Urine culture     Status: Abnormal   Collection Time: 02/04/16  9:39 PM  Result Value Ref Range Status   Specimen Description URINE, CLEAN CATCH  Final   Special Requests NONE  Final   Culture MULTIPLE SPECIES PRESENT, SUGGEST RECOLLECTION (A)  Final   Report Status 02/06/2016 FINAL  Final  Blood culture (routine x 2)     Status: None   Collection Time: 02/04/16  9:44 PM  Result Value Ref Range Status   Specimen Description BLOOD LEFT WRIST  Final   Special Requests BOTTLES DRAWN AEROBIC AND ANAEROBIC 5CC  Final   Culture   Final    NO GROWTH 5 DAYS Performed at [facility]    Report Status 02/09/2016 FINAL  Final  MRSA PCR Screening      Status: None   Collection Time: 02/05/16  2:40 AM  Result Value Ref Range Status   MRSA by PCR NEGATIVE NEGATIVE Final    Comment:        The GeneXpert MRSA Assay (FDA approved for NASAL specimens only), is one component of a comprehensive MRSA colonization surveillance program. It is not intended to diagnose MRSA infection nor to  guide or monitor treatment for MRSA infections.   Blood culture (routine x 2)     Status: None   Collection Time: 02/05/16  4:00 AM  Result Value Ref Range Status   Specimen Description BLOOD LEFT ARM  Final   Special Requests BOTTLES DRAWN AEROBIC ONLY 6 CC  Final   Culture   Final    NO GROWTH 5 DAYS Performed at [facility]    Report Status 02/10/2016 FINAL  Final  Culture, Urine     Status: None   Collection Time: 02/05/16  4:30 AM  Result Value Ref Range Status   Specimen Description URINE, RANDOM  Final   Special Requests NONE  Final   Culture NO GROWTH Performed at [facility]   Final   Report Status 02/06/2016 FINAL  Final     Liver Function Tests: No results for input(s): AST, ALT, ALKPHOS, BILITOT, PROT, ALBUMIN in the last 168 hours. No results for input(s): LIPASE, AMYLASE in the last 168 hours. No results for input(s): AMMONIA in the last 168 hours.  Cardiac Enzymes: No results for input(s): CKTOTAL, CKMB, CKMBINDEX, TROPONINI in the last 168 hours.    Studies: No results found.  Scheduled Meds: . atorvastatin  40 mg Oral q1800  . clindamycin  300 mg Oral Q8H  . furosemide  40 mg Oral Daily  . guaiFENesin  600 mg Oral BID  . lactobacillus acidophilus & bulgar  2 tablet Oral TID WC  . levalbuterol  0.63 mg Nebulization BID  . levofloxacin (LEVAQUIN) IV  750 mg Intravenous Q24H  . mouth rinse  15 mL Mouth Rinse BID  . nystatin  5 mL Oral QID  . pantoprazole  40 mg Oral Q0600  . polyethylene glycol  17 g Oral Daily  . senna-docusate  2 tablet Oral BID  . sucralfate  1 g Oral TID WC & HS  . terazosin  2  mg Oral QHS  . tiotropium  18 mcg Inhalation Daily    Continuous Infusions:   Time spent: 25 min  [facility] Pager [phone]. If 7PM-7AM, please contact night-coverage at [email or web address], Office  [phone]  password [email or web address] 02/14/2016, 2:19 PM  LOS: 9 days

## 2016-02-15 LAB — CBC
HEMATOCRIT: 39.2 % (ref 39.0–52.0)
HEMOGLOBIN: 12.5 g/dL — AB (ref 13.0–17.0)
MCH: 28.4 pg (ref 26.0–34.0)
MCHC: 31.9 g/dL (ref 30.0–36.0)
MCV: 89.1 fL (ref 78.0–100.0)
Platelets: 220 10*3/uL (ref 150–400)
RBC: 4.4 MIL/uL (ref 4.22–5.81)
RDW: 13.4 % (ref 11.5–15.5)
WBC: 13.8 10*3/uL — AB (ref 4.0–10.5)

## 2016-02-15 LAB — COMPREHENSIVE METABOLIC PANEL
ALBUMIN: 2.5 g/dL — AB (ref 3.5–5.0)
ALT: 16 U/L — AB (ref 17–63)
AST: 18 U/L (ref 15–41)
Alkaline Phosphatase: 43 U/L (ref 38–126)
Anion gap: 9 (ref 5–15)
BILIRUBIN TOTAL: 0.8 mg/dL (ref 0.3–1.2)
BUN: 25 mg/dL — AB (ref 6–20)
CALCIUM: 8.1 mg/dL — AB (ref 8.9–10.3)
CO2: 36 mmol/L — ABNORMAL HIGH (ref 22–32)
Chloride: 89 mmol/L — ABNORMAL LOW (ref 101–111)
Creatinine, Ser: 0.68 mg/dL (ref 0.61–1.24)
GFR calc Af Amer: >60 mL/min (ref >60)
GFR calc non Af Amer: >60 mL/min (ref >60)
GLUCOSE: 138 mg/dL — AB (ref 65–99)
Potassium: 3.6 mmol/L (ref 3.5–5.1)
Sodium: 134 mmol/L — ABNORMAL LOW (ref 135–145)
TOTAL PROTEIN: 5.2 g/dL — AB (ref 6.5–8.1)

## 2016-02-15 MED ORDER — LEVOFLOXACIN 750 MG PO TABS
750.0000 mg | ORAL_TABLET | ORAL | Status: DC
  Administered 2016-02-15 – 2016-02-16 (×2): 750 mg via ORAL
  Filled 2016-02-15 (×2): qty 1

## 2016-02-15 MED ORDER — DIPHENHYDRAMINE HCL 25 MG PO CAPS: 25.0000 mg | ORAL_CAPSULE | Freq: Four times a day (QID) | ORAL | Status: DC | PRN

## 2016-02-15 MED ORDER — METRONIDAZOLE 500 MG PO TABS
500.0000 mg | ORAL_TABLET | Freq: Three times a day (TID) | ORAL | Status: DC
  Administered 2016-02-15 – 2016-02-17 (×5): 500 mg via ORAL
  Filled 2016-02-15 (×5): qty 1

## 2016-02-15 NOTE — Progress Notes (Signed)
[facility]  PROGRESS NOTE  Allen Beasley [id number] DOB: [birth year] DOA: 02/04/2016 PCP: Geoffery Lyons, MD   Brief HPI:   82 year old male with a history of COPD/emphysema, hypertension, aortic insufficiency who was admitted with acute on chronic hypoxic respiratory failure, initially patient required BiPAP and was later transitioned to 6 L of nasal cannula to maintain O2 sats greater than 92%. Patient started on empiric antibiotics, IV Solu-Medrol and admitted to stepdown. Patient was seen by critical care medicine.   Subjective   This morning patient Denies shortness of breath. Slowly improving   Assessment/Plan:     1. Acute on chronic hypoxic respiratory failure- improved, multifactorial from underlying emphysema, aspiration pneumonia. Prednisone stopped per [facility] M, continue Levaquin and clindamycin. He will need 2-4 weeks course of antibiotics due to underlying necrotizing pneumonia. Repeat chest x-ray this morning shows persistent left lower lobe pneumonia.Patient will need 4 weeks of total antibiotics. And LTAC placement as per [facility] M. 2. Odynophagia- ? component of candida esophagitis, started on nystatin 500,000 units oral 4 times daily. Continue Carafate, Protonix. 3. BPH- continue Hytrin 4. Hypertension- blood pressure controlled 5. Moderate bilateral pleural effusions- patient started on Lasix, echogram showed LVH, EF [id number], grade 1 diastolic dysfunction. 6. Hematuria- UA on 02/06/2016 showed significant RBCs, unclear etiology. No hydronephrosis on CT abdomen and pelvis. Urine cultures are negative. Lovenox has been stopped. Might need urology evaluation as outpatient. Hemoglobin has been stable in the hospital     DVT prophylaxis: SCDs, Lovenox was stopped due to hematuria on 02/06/2016.  Code Status: Full code  Family Communication: No family at bedside   Disposition Plan:  Patient to go to  LTAC  Consultants:  PCCM  Procedures:  None  Antibiotics:   Anti-infectives    Start     Dose/Rate Route Frequency Ordered Stop   02/10/16 1400  clindamycin (CLEOCIN) capsule 300 mg     300 mg Oral Every 8 hours 02/10/16 1138     02/06/16 1700  levofloxacin (LEVAQUIN) IVPB 750 mg     750 mg 100 mL/hr over 90 Minutes Intravenous Every 24 hours 02/06/16 1632     02/05/16 2200  levofloxacin (LEVAQUIN) IVPB 750 mg  Status:  Discontinued     750 mg 100 mL/hr over 90 Minutes Intravenous Daily at bedtime 02/05/16 0538 02/05/16 0946   02/05/16 2200  levofloxacin (LEVAQUIN) tablet 750 mg  Status:  Discontinued     750 mg Oral Daily at bedtime 02/05/16 1414 02/06/16 1632   02/05/16 1200  vancomycin (VANCOCIN) IVPB 750 mg/150 ml premix  Status:  Discontinued     750 mg 150 mL/hr over 60 Minutes Intravenous Every 12 hours 02/05/16 0538 02/05/16 1414   02/05/16 0600  aztreonam (AZACTAM) 1 g in dextrose 5 % 50 mL IVPB  Status:  Discontinued     1 g 100 mL/hr over 30 Minutes Intravenous Every 8 hours 02/05/16 0538 02/05/16 1414   02/04/16 2145  levofloxacin (LEVAQUIN) IVPB 750 mg     750 mg 100 mL/hr over 90 Minutes Intravenous  Once 02/04/16 2140 02/04/16 2358   02/04/16 2145  aztreonam (AZACTAM) 2 g in dextrose 5 % 50 mL IVPB     2 g 100 mL/hr over 30 Minutes Intravenous  Once 02/04/16 2140 02/04/16 2228   02/04/16 2145  vancomycin (VANCOCIN) IVPB 1000 mg/200 mL premix     1,000 mg 200 mL/hr over 60 Minutes Intravenous  Once 02/04/16 2140 02/05/16 0142       Objective  Vitals:   02/14/16 2135 02/15/16 0524 02/15/16 0822 02/15/16 0829  BP: (!) 111/42 113/78    Pulse: 81 71    Resp: 20 18    Temp: 98.1 F (36.7 C) 97.5 F (36.4 C)    TempSrc: Oral Oral    SpO2: (!) 88% 93% (!) 89% (!) 89%  Weight:      Height:        Intake/Output Summary (Last 24 hours) at 02/15/16 1238 Last data filed at 02/15/16 0641  Gross per 24 hour  Intake              450 ml  Output               500 ml  Net              -50 ml   Filed Weights   02/05/16 0215 02/05/16 0500  Weight: 84.4 kg (186 lb 1.1 oz) 84.4 kg (186 lb 1.1 oz)     Physical Examination:  General exam: Appears calm and comfortable. Respiratory system: Decreased breath sounds bilaterally, mild tachypnea Cardiovascular system:  RRR. No  murmurs, rubs, gallops. No pedal edema. GI system: Abdomen is nondistended, soft and nontender. No organomegaly.  Central nervous system. No focal neurological deficits. 5 x 5 power in all extremities. Skin: No rashes, lesions or ulcers. Psychiatry: Alert, oriented x 3.Judgement and insight appear normal. Affect normal.    Data Reviewed: I have personally reviewed following labs and imaging studies  CBG:  Recent Labs Lab 02/10/16 0729  GLUCAP 172*    CBC:  Recent Labs Lab 02/10/16 0323 02/13/16 0316 02/15/16 0514  WBC 17.7* 15.1* 13.8*  HGB 14.7 13.4 12.5*  HCT 45.4 42.4 39.2  MCV 90.4 90.0 89.1  PLT 248 240 [id number]    Basic Metabolic Panel:  Recent Labs Lab 02/09/16 0329 02/10/16 0323 02/11/16 0331 02/13/16 0316 02/15/16 0514  NA 136 136 137 133* 134*  K 4.1 3.9 4.1 3.7 3.6  CL 94* 92* 91* 89* 89*  CO2 32 36* 39* 37* 36*  GLUCOSE 148* 167* 171* 166* 138*  BUN 41* 43* 47* 36* 25*  CREATININE 0.80 0.72 0.79 0.71 0.68  CALCIUM 8.5* 8.8* 8.6* 8.2* 8.1*    No results found for this or any previous visit (from the past 240 hour(s)).   Liver Function Tests:  Recent Labs Lab 02/15/16 0514  AST 18  ALT 16*  ALKPHOS 43  BILITOT 0.8  PROT 5.2*  ALBUMIN 2.5*   No results for input(s): LIPASE, AMYLASE in the last 168 hours. No results for input(s): AMMONIA in the last 168 hours.  Cardiac Enzymes: No results for input(s): CKTOTAL, CKMB, CKMBINDEX, TROPONINI in the last 168 hours.    Studies: No results found.  Scheduled Meds: . atorvastatin  40 mg Oral q1800  . clindamycin  300 mg Oral Q8H  . furosemide  40 mg Oral Daily  .  guaiFENesin  600 mg Oral BID  . lactobacillus acidophilus & bulgar  2 tablet Oral TID WC  . levalbuterol  0.63 mg Nebulization BID  . levofloxacin (LEVAQUIN) IV  750 mg Intravenous Q24H  . mouth rinse  15 mL Mouth Rinse BID  . nystatin  5 mL Oral QID  . pantoprazole  40 mg Oral Q0600  . polyethylene glycol  17 g Oral Daily  . senna-docusate  2 tablet Oral BID  . sucralfate  1 g Oral TID WC & HS  . terazosin  2 mg Oral  QHS  . tiotropium  18 mcg Inhalation Daily    Continuous Infusions:  Time spent: 25 min  [facility] Pager [phone]. If 7PM-7AM, please contact night-coverage at [email or web address], Office  [phone]  password [email or web address] 02/15/2016, 12:38 PM  LOS: 10 days

## 2016-02-15 NOTE — Progress Notes (Signed)
This RN was assisting the tech with a linen change and noticed a rash covering the patient's back. Patient c/o a small amount of itching. Dr. Darrick Meigs notified. Benadryl ordered PRN. Will continue to monitor.

## 2016-02-16 DIAGNOSIS — J9601 Acute respiratory failure with hypoxia: Secondary | ICD-10-CM

## 2016-02-16 DIAGNOSIS — J181 Lobar pneumonia, unspecified organism: Secondary | ICD-10-CM

## 2016-02-16 LAB — CBC
HEMATOCRIT: 43.5 % (ref 39.0–52.0)
HEMOGLOBIN: 13.9 g/dL (ref 13.0–17.0)
MCH: 28.5 pg (ref 26.0–34.0)
MCHC: 32 g/dL (ref 30.0–36.0)
MCV: 89.3 fL (ref 78.0–100.0)
Platelets: 221 10*3/uL (ref 150–400)
RBC: 4.87 MIL/uL (ref 4.22–5.81)
RDW: 13.6 % (ref 11.5–15.5)
WBC: 15.1 10*3/uL — AB (ref 4.0–10.5)

## 2016-02-16 LAB — BASIC METABOLIC PANEL
ANION GAP: 9 (ref 5–15)
BUN: 22 mg/dL — ABNORMAL HIGH (ref 6–20)
CALCIUM: 8.4 mg/dL — AB (ref 8.9–10.3)
CO2: 34 mmol/L — ABNORMAL HIGH (ref 22–32)
Chloride: 88 mmol/L — ABNORMAL LOW (ref 101–111)
Creatinine, Ser: 0.79 mg/dL (ref 0.61–1.24)
Glucose, Bld: 191 mg/dL — ABNORMAL HIGH (ref 65–99)
POTASSIUM: 3.7 mmol/L (ref 3.5–5.1)
SODIUM: 131 mmol/L — AB (ref 135–145)

## 2016-02-16 NOTE — NC FL2 (Signed)
[place] LEVEL OF CARE SCREENING TOOL     IDENTIFICATION  Patient Name: Allen Beasley Birthdate: 12/11/1923 Sex: male Admission Date (Current Location): 02/04/2016  [facility] and [place] Number:  Herbalist and Address:  [facility],  [place] [address] [place]      Provider Number: [id number]  Attending Physician Name and Address:  Oswald Hillock, MD  Relative Name and Phone Number:       Current Level of Care: Hospital Recommended Level of Care: [place] Prior Approval Number:    Date Approved/Denied:   PASRR Number: [id number]  Discharge Plan: SNF    Current Diagnoses: Patient Active Problem List   Diagnosis Date Noted  . Necrotizing pneumonia ([place])   . Hypoxia   . Esophageal reflux   . Goals of care, counseling/discussion   . Palliative care encounter   . Acute on chronic respiratory failure with hypoxia ([place])   . Sepsis ([place]) 02/05/2016  . Acute respiratory failure with hypoxia ([place]) 02/05/2016  . BPH (benign prostatic hyperplasia) 02/05/2016  . Hyperglycemia 02/05/2016  . Thyroid nodule 09/15/2013  . CAP (community acquired pneumonia) 08/19/2012  . HTN (hypertension) 08/05/2010  . Erectile dysfunction   . History of chest pain   . History of aortic insufficiency   . HYPERLIPIDEMIA 01/09/2010  . COPD with emphysema ([place]) 06/19/2007    Orientation RESPIRATION BLADDER Height & Weight     Self, Time, Situation, Place  O2 (6L) Incontinent (Suprapubic Catheter) Weight: 186 lb 1.1 oz (84.4 kg) Height:  5\' 11"  (180.3 cm)  BEHAVIORAL SYMPTOMS/MOOD NEUROLOGICAL BOWEL NUTRITION STATUS      Continent Diet (Heart Healthy)  AMBULATORY STATUS COMMUNICATION OF NEEDS Skin   Extensive Assist Verbally Surgical wounds (Incision (Closed) Neck Posterior)                       Personal Care Assistance Level of Assistance  Bathing, Dressing Bathing Assistance: Limited assistance   Dressing Assistance:  Limited assistance     Functional Limitations Info             SPECIAL CARE FACTORS FREQUENCY  PT (By licensed PT), OT (By licensed OT)     PT Frequency: 5 OT Frequency: 5            Contractures      Additional Factors Info  Code Status, Allergies Code Status Info: Fullcode Allergies Info: Allergies:  Penicillins           Current Medications (02/16/2016):  This is the current hospital active medication list Current Facility-Administered Medications  Medication Dose Route Frequency Provider Last Rate Last Dose  . acetaminophen (TYLENOL) tablet 650 mg  650 mg Oral Q6H PRN Jannette Fogo, NP      . atorvastatin (LIPITOR) tablet 40 mg  40 mg Oral q1800 Norval Morton, MD   40 mg at 02/15/16 1742  . diphenhydrAMINE (BENADRYL) capsule 25 mg  25 mg Oral Q6H PRN Oswald Hillock, MD      . gi cocktail (Maalox,Lidocaine,Donnatal)  30 mL Oral BID PRN Hosie Poisson, MD   30 mL at 02/13/16 1628  . guaiFENesin (MUCINEX) 12 hr tablet 600 mg  600 mg Oral BID Norval Morton, MD   600 mg at 02/16/16 1002  . guaiFENesin-dextromethorphan (ROBITUSSIN DM) 100-10 MG/5ML syrup 5 mL  5 mL Oral Q4H PRN Hosie Poisson, MD   5 mL at 02/10/16 1008  . iopamidol (ISOVUE-300)  61 % injection 30 mL  30 mL Oral Once PRN Hosie Poisson, MD      . lactobacillus acidophilus & bulgar (LACTINEX) chewable tablet 2 tablet  2 tablet Oral TID WC Oswald Hillock, MD   2 tablet at 02/16/16 0821  . levalbuterol (XOPENEX) nebulizer solution 0.63 mg  0.63 mg Nebulization Q4H PRN Norval Morton, MD      . levalbuterol (XOPENEX) nebulizer solution 0.63 mg  0.63 mg Nebulization BID Hosie Poisson, MD   0.63 mg at 02/16/16 0756  . levofloxacin (LEVAQUIN) tablet 750 mg  750 mg Oral Q24H Oswald Hillock, MD   750 mg at 02/15/16 1742  . magnesium hydroxide (MILK OF MAGNESIA) suspension 30 mL  30 mL Oral Daily PRN Hosie Poisson, MD      . MEDLINE mouth rinse  15 mL Mouth Rinse BID Hosie Poisson, MD   15 mL at 02/16/16 1000  .  metroNIDAZOLE (FLAGYL) tablet 500 mg  500 mg Oral Q8H Oswald Hillock, MD   500 mg at 02/16/16 [id number]  . nystatin (MYCOSTATIN) 100000 UNIT/ML suspension 500,000 Units  5 mL Oral QID Erick Colace, NP   500,000 Units at 02/16/16 1002  . ondansetron (ZOFRAN) tablet 4 mg  4 mg Oral Q6H PRN Norval Morton, MD       Or  . ondansetron (ZOFRAN) injection 4 mg  4 mg Intravenous Q6H PRN Rondell A Tamala Julian, MD      . pantoprazole (PROTONIX) EC tablet 40 mg  40 mg Oral Q0600 Hosie Poisson, MD   40 mg at 02/16/16 [id number]  . polyethylene glycol (MIRALAX / GLYCOLAX) packet 17 g  17 g Oral Daily Norval Morton, MD   17 g at 02/16/16 1001  . senna-docusate (Senokot-S) tablet 2 tablet  2 tablet Oral BID Hosie Poisson, MD   2 tablet at 02/16/16 1002  . sucralfate (CARAFATE) 1 GM/10ML suspension 1 g  1 g Oral TID WC & HS Hosie Poisson, MD   1 g at 02/16/16 0821  . temazepam (RESTORIL) capsule 15 mg  15 mg Oral QHS PRN Reyne Dumas, MD   15 mg at 02/15/16 2205  . terazosin (HYTRIN) capsule 2 mg  2 mg Oral QHS Norval Morton, MD   2 mg at 02/15/16 2205  . tiotropium (SPIRIVA) inhalation capsule 18 mcg  18 mcg Inhalation Daily Hosie Poisson, MD   18 mcg at 02/16/16 0756     Discharge Medications: Please see discharge summary for a list of discharge medications.  Relevant Imaging Results:  Relevant Lab Results:   Additional Information SSN: [id number]  Standley Brooking, LCSW

## 2016-02-16 NOTE — Progress Notes (Signed)
Name: Allen Beasley MRN: [id number] DOB: [birth year]    ADMISSION DATE:  02/04/2016 CONSULTATION DATE:  10/31  REFERRING MD :  Karleen Hampshire   CHIEF COMPLAINT:  Refractory hypoxia   BRIEF PATIENT DESCRIPTION:  69 yom w/ known h/o COPD who doesn't take his prescribed home oxygen admitted on 10/25 with acute on chronci respiratory failure with hypoxemia in setting of necrotizing pneumonia in the left lower lobe.    SUBJECTIVE:  Pt anxious to go home, does not want to go to rehab   VITAL SIGNS: Temp:  [97.8 F (36.6 C)-98.3 F (36.8 C)] 97.8 F (36.6 C) (11/06 0553) Pulse Rate:  [47-89] 84 (11/06 0553) Resp:  [16-18] 18 (11/06 0553) BP: (113-117)/(58-73) 117/73 (11/06 0553) SpO2:  [88 %-94 %] 94 % (11/06 0800) FiO2 (%):  [44 %] 44 % (11/06 0800)  PHYSICAL EXAMINATION: General:  Resting comfortably in bed.  HENT: NCAT OP clear PULM:  Non-labored, good air entry, diminished bilateral lower lobes CV: RRR, no mgr GI: BS+, soft, nontender MSK: normal bulk and tone Derm: some extensor surface bruising Neuro: A&Ox4, maew    Recent Labs Lab 02/11/16 0331 02/13/16 0316 02/15/16 0514  NA 137 133* 134*  K 4.1 3.7 3.6  CL 91* 89* 89*  CO2 39* 37* 36*  BUN 47* 36* 25*  CREATININE 0.79 0.71 0.68  GLUCOSE 171* 166* 138*    Recent Labs Lab 02/10/16 0323 02/13/16 0316 02/15/16 0514  HGB 14.7 13.4 12.5*  HCT 45.4 42.4 39.2  WBC 17.7* 15.1* 13.8*  PLT 248 240 220   ABG    Component Value Date/Time   PHART 7.480 (H) 02/09/2016 1826   PCO2ART 50.7 (H) 02/09/2016 1826   PO2ART 56.6 (L) 02/09/2016 1826   HCO3 37.4 (H) 02/09/2016 1826   TCO2 25 08/19/2012 1336   O2SAT 88.6 02/09/2016 1826   No results found. Necrotizing pneumonia left lower lobe on CT, pleural effusions (small present)  SIGNIFICANT EVENTS  10/25  Admit with acute on chronic hypoxic resp fx 2/2 to PNA 10/30  Bedside swallow eval > odynophagia, refluxing  10/30  Seen by Palliative Care, remains full  code  STUDIES:  CT chest 10/30 >> negative for PE. Trace right effusion, small to mod left effusion w/ possible loculation, emphysema changes, LLL necrotic changes, Dense Bilateral consolidations R>L.  ECHO 10/30 >> EF [id number], grade I diastolic dyfxn,  Barium Swallow 10/31 >> no esophageal peristalsis   ASSESSMENT / PLAN:  Acute on Chronic Hypoxic Respiratory Failure  Aspiration Pneumonia LLL > Necrotizing  COPD, not in exacerbation Small parapneumonic effusions Severe esophageal dysmotility w/ reflux Grade I diastolic dysfxn Odynophagia > candidal esophagitis?  Discussion.  Severe hypoxemia explained by baseline centrilobular emphysema and acute necrotizing pneumonia in left lower lobe due to aspiration pneumonia.  He is aspirating due to lack of esophageal motility. Suspect it will take weeks for his oxygenation to return to baseline.  It may never completely return to baseline. Note O2 sat site changed to ear with sats 98%.     Plan Cont to wean O2 for sats > 88% Will need ambulatory O2 on 4-6L prior to discharge to determine exertional needs  Cont spiriva and rescue BDs Agree w/ SLP recs re: reflux precautions as noted Continue levaquin and clindamycin> he will need a 3-4 week course of antibiotics given the necrotizing nature of his pneumonia Probiotics given broad antibiotic coverage Consider GI evaluation  Continue PPI Follow up with Pulmonary arranged, will need repeat imaging  after abx completed   GLOBAL: He would most likely benefit from [facility] with Pulmonary rehab focus. Nutrition, excerise, oxygen weaning in controlled environment but he wants to d/c home.  Feels he has enough equipment / support for home discharge.    PCCM will be available PRN. Please call if new needs arise.    Noe Gens, NP-C [facility] Pgr: [phone] or if no answer [phone] 02/16/2016, 10:08 AM

## 2016-02-16 NOTE — Progress Notes (Signed)
Initial Nutrition Assessment  DOCUMENTATION CODES:   Severe malnutrition in context of chronic illness  INTERVENTION:   Continue supplements from home Encourage PO intake RD to continue to monitor  NUTRITION DIAGNOSIS:   Malnutrition related to chronic illness as evidenced by severe depletion of body fat, severe depletion of muscle mass.  GOAL:   Patient will meet greater than or equal to 90% of their needs  MONITOR:   PO intake, Labs, Weight trends, I & O's  REASON FOR ASSESSMENT:   LOS x 11 days    ASSESSMENT:   82 year old male with a history of COPD/emphysema, hypertension, aortic insufficiency who was admitted with acute on chronic hypoxic respiratory failure, initially patient required BiPAP and was later transitioned to 6 L of nasal cannula to maintain O2 sats greater than 92%  Patient in room with no family present. Pt snacking on strawberries. Pt states his niece provides him with some food. Pt states his niece brought him some protein shakes (Evolve protein shakes w/ 20g protein). He has not been drinking them. RD encouraged pt to eat snacks, meals and supplements. Pt reports some swallowing difficulty, states he tries to eat soft foods. Pt consumed 1/3 of his breakfast tray of an omelette and pancakes this morning. PO intakes have ranged from 50-100% during this admission. Pt states his appetite is overall improved.   Pt states his UBW is >200 lb. Pt with weight loss but insignificant for time frame. Nutrition-Focused physical exam completed. Findings are severe fat depletion, severe muscle depletion, and mild edema. Suspect depletion partly d/t advanced age.  Medications: Nystatin suspension QID, Miralax packet daily, Senokot-S tablet BID,  Labs reviewed: Low Na  Diet Order:  Diet Heart Room service appropriate? Yes; Fluid consistency: Thin  Skin:  Reviewed, no issues  Last BM:  11/5  Height:   Ht Readings from Last 1 Encounters:  02/05/16 5\' 11"  (1.803  m)    Weight:   Wt Readings from Last 1 Encounters:  02/05/16 186 lb 1.1 oz (84.4 kg)    Ideal Body Weight:  78.18 kg  BMI:  Body mass index is 25.95 kg/m.  Estimated Nutritional Needs:   Kcal:  1850-2050  Protein:  95-105g  Fluid:  2L/day  EDUCATION NEEDS:   No education needs identified at this time  Clayton Bibles, MS, RD, LDN Pager: [phone] After Hours Pager: [phone]

## 2016-02-16 NOTE — Clinical Social Work Note (Signed)
Clinical Social Work Assessment  Patient Details  Name: Allen Beasley MRN: [id number] Date of Birth: 1923/12/20  Date of referral:  02/16/16               Reason for consult:  Facility Placement                Permission sought to share information with:  Chartered certified accountant granted to share information::  Yes, Verbal Permission Granted  Name::        Agency::     Relationship::     Contact Information:     Housing/Transportation Living arrangements for the past 2 months:  Single Family Home Source of Information:  Patient Patient Interpreter Needed:  None Criminal Activity/Legal Involvement Pertinent to Current Situation/Hospitalization:  No - Comment as needed Significant Relationships:  Adult Children Lives with:  Self Do you feel safe going back to the place where you live?  No Need for family participation in patient care:  Yes (Comment)  Care giving concerns:  CSW reviewed PT evaluation recommending SNF at discharge.    Social Worker assessment / plan:  CSW confirmed with RNCM, Cookie that patient no longer qualifies for LTAC. CSW spoke with patient re: SNF/discharge planning. Patient states that he had been to [facility] back in 2013 and didn't have the best experience but is willing to consider it. Patient states that his niece, Mardene Celeste is in town from [place], [place] currently and plans to tour facilities before making a decision.   Employment status:  Retired Forensic scientist:  Medicare PT Recommendations:  [place] / Referral to community resources:  [place]  Patient/Family's Response to care:  Patient states that he had voiced his concerns to [place] before he left the last time and it had been a few years so hopefully things have changed.   Patient/Family's Understanding of and Emotional Response to Diagnosis, Current Treatment, and Prognosis:    Emotional Assessment Appearance:  Appears  stated age Attitude/Demeanor/Rapport:    Affect (typically observed):  Accepting Orientation:  Oriented to Self, Oriented to Place, Oriented to  Time, Oriented to Situation Alcohol / Substance use:    Psych involvement (Current and /or in the community):     Discharge Needs  Concerns to be addressed:    Readmission within the last 30 days:    Current discharge risk:    Barriers to Discharge:      Standley Brooking, LCSW 02/16/2016, 2:26 PM

## 2016-02-16 NOTE — Care Management Important Message (Signed)
Important Message  Patient Details  Name: RONTAE VINAS MRN: [id number] Date of Birth: 10-Feb-1924   Medicare Important Message Given:  Yes    Camillo Flaming 02/16/2016, 10:37 AMImportant Message  Patient Details  Name: EDMAR IGARASHI MRN: [id number] Date of Birth: 07-Jun-1923   Medicare Important Message Given:  Yes    Camillo Flaming 02/16/2016, 10:37 AM

## 2016-02-16 NOTE — Progress Notes (Signed)
[facility]  PROGRESS NOTE  Allen DEBORDE [id number] DOB: 1923/08/20 DOA: 02/04/2016 PCP: Geoffery Lyons, MD   Brief HPI:   82 year old male with a history of COPD/emphysema, hypertension, aortic insufficiency who was admitted with acute on chronic hypoxic respiratory failure, initially patient required BiPAP and was later transitioned to 6 L of nasal cannula to maintain O2 sats greater than 92%. Patient started on empiric antibiotics, IV Solu-Medrol and admitted to stepdown. Patient was seen by critical care medicine.   Subjective   This morning patient Denies shortness of breath. Wants to go home.Patient developed rash on his back yesterday, clindamycin was changed to Flagyl.   Assessment/Plan:     1. Acute on chronic hypoxic respiratory failure- improved, multifactorial from underlying emphysema, aspiration pneumonia. Prednisone stopped per [facility] M, continue Levaquin and Flagyl He will need 2-4 weeks course of antibiotics due to underlying necrotizing pneumonia. Repeat chest x-ray this morning shows persistent left lower lobe pneumonia.Patient will need 4 weeks of total antibiotics. And LTAC placement as per [facility] M. patient wants to go home, will call PT to reassess if patient can go home with home health PT. 2. Odynophagia- ? component of candida esophagitis, started on nystatin 500,000 units oral 4 times daily. Continue Carafate, Protonix. 3. BPH- continue Hytrin 4. Hypertension- blood pressure controlled 5. Moderate bilateral pleural effusions- patient started on Lasix, echogram showed LVH, EF [id number], grade 1 diastolic dysfunction. Will hold Lasix today as patient does not seem to be fluid overloaded this time. 6. Hematuria- UA on 02/06/2016 showed significant RBCs, unclear etiology. No hydronephrosis on CT abdomen and pelvis. Urine cultures are negative. Lovenox has been stopped. Might need urology evaluation as outpatient. Hemoglobin has been stable in the hospital     DVT  prophylaxis: SCDs, Lovenox was stopped due to hematuria on 02/06/2016.  Code Status: Full code  Family Communication: No family at bedside   Disposition Plan:  Patient to go to LTAC  Consultants:  PCCM  Procedures:  None  Antibiotics:   Anti-infectives    Start     Dose/Rate Route Frequency Ordered Stop   02/15/16 2200  metroNIDAZOLE (FLAGYL) tablet 500 mg     500 mg Oral Every 8 hours 02/15/16 1543     02/15/16 1700  levofloxacin (LEVAQUIN) tablet 750 mg     750 mg Oral Every 24 hours 02/15/16 1543     02/10/16 1400  clindamycin (CLEOCIN) capsule 300 mg  Status:  Discontinued     300 mg Oral Every 8 hours 02/10/16 1138 02/15/16 1543   02/06/16 1700  levofloxacin (LEVAQUIN) IVPB 750 mg  Status:  Discontinued     750 mg 100 mL/hr over 90 Minutes Intravenous Every 24 hours 02/06/16 1632 02/15/16 1543   02/05/16 2200  levofloxacin (LEVAQUIN) IVPB 750 mg  Status:  Discontinued     750 mg 100 mL/hr over 90 Minutes Intravenous Daily at bedtime 02/05/16 0538 02/05/16 0946   02/05/16 2200  levofloxacin (LEVAQUIN) tablet 750 mg  Status:  Discontinued     750 mg Oral Daily at bedtime 02/05/16 1414 02/06/16 1632   02/05/16 1200  vancomycin (VANCOCIN) IVPB 750 mg/150 ml premix  Status:  Discontinued     750 mg 150 mL/hr over 60 Minutes Intravenous Every 12 hours 02/05/16 0538 02/05/16 1414   02/05/16 0600  aztreonam (AZACTAM) 1 g in dextrose 5 % 50 mL IVPB  Status:  Discontinued     1 g 100 mL/hr over 30 Minutes Intravenous Every 8 hours  02/05/16 0538 02/05/16 1414   02/04/16 2145  levofloxacin (LEVAQUIN) IVPB 750 mg     750 mg 100 mL/hr over 90 Minutes Intravenous  Once 02/04/16 2140 02/04/16 2358   02/04/16 2145  aztreonam (AZACTAM) 2 g in dextrose 5 % 50 mL IVPB     2 g 100 mL/hr over 30 Minutes Intravenous  Once 02/04/16 2140 02/04/16 2228   02/04/16 2145  vancomycin (VANCOCIN) IVPB 1000 mg/200 mL premix     1,000 mg 200 mL/hr over 60 Minutes Intravenous  Once 02/04/16 2140  02/05/16 0142       Objective   Vitals:   02/15/16 1932 02/15/16 2130 02/16/16 0553 02/16/16 0800  BP:  113/60 117/73   Pulse: 63 89 84   Resp: 18 18 18    Temp:  98.2 F (36.8 C) 97.8 F (36.6 C)   TempSrc:  Oral Oral   SpO2: (!) 88% 93% 94% 94%  Weight:      Height:        Intake/Output Summary (Last 24 hours) at 02/16/16 1211 Last data filed at 02/16/16 1100  Gross per 24 hour  Intake                0 ml  Output              825 ml  Net             -825 ml   Filed Weights   02/05/16 0215 02/05/16 0500  Weight: 84.4 kg (186 lb 1.1 oz) 84.4 kg (186 lb 1.1 oz)     Physical Examination:  General exam: Appears calm and comfortable. Respiratory system: Decreased breath sounds bilaterally, mild tachypnea Cardiovascular system:  RRR. No  murmurs, rubs, gallops. No pedal edema. GI system: Abdomen is nondistended, soft and nontender. No organomegaly.  Central nervous system. No focal neurological deficits. 5 x 5 power in all extremities. Skin: No rashes, lesions or ulcers. Psychiatry: Alert, oriented x 3.Judgement and insight appear normal. Affect normal.    Data Reviewed: I have personally reviewed following labs and imaging studies  CBG:  Recent Labs Lab 02/10/16 0729  GLUCAP 172*    CBC:  Recent Labs Lab 02/10/16 0323 02/13/16 0316 02/15/16 0514  WBC 17.7* 15.1* 13.8*  HGB 14.7 13.4 12.5*  HCT 45.4 42.4 39.2  MCV 90.4 90.0 89.1  PLT 248 240 [id number]    Basic Metabolic Panel:  Recent Labs Lab 02/10/16 0323 02/11/16 0331 02/13/16 0316 02/15/16 0514  NA 136 137 133* 134*  K 3.9 4.1 3.7 3.6  CL 92* 91* 89* 89*  CO2 36* 39* 37* 36*  GLUCOSE 167* 171* 166* 138*  BUN 43* 47* 36* 25*  CREATININE 0.72 0.79 0.71 0.68  CALCIUM 8.8* 8.6* 8.2* 8.1*    No results found for this or any previous visit (from the past 240 hour(s)).   Liver Function Tests:  Recent Labs Lab 02/15/16 0514  AST 18  ALT 16*  ALKPHOS 43  BILITOT 0.8  PROT 5.2*   ALBUMIN 2.5*   No results for input(s): LIPASE, AMYLASE in the last 168 hours. No results for input(s): AMMONIA in the last 168 hours.  Cardiac Enzymes: No results for input(s): CKTOTAL, CKMB, CKMBINDEX, TROPONINI in the last 168 hours.    Studies: No results found.  Scheduled Meds: . atorvastatin  40 mg Oral q1800  . guaiFENesin  600 mg Oral BID  . lactobacillus acidophilus & bulgar  2 tablet Oral TID WC  .  levalbuterol  0.63 mg Nebulization BID  . levofloxacin  750 mg Oral Q24H  . mouth rinse  15 mL Mouth Rinse BID  . metroNIDAZOLE  500 mg Oral Q8H  . nystatin  5 mL Oral QID  . pantoprazole  40 mg Oral Q0600  . polyethylene glycol  17 g Oral Daily  . senna-docusate  2 tablet Oral BID  . sucralfate  1 g Oral TID WC & HS  . terazosin  2 mg Oral QHS  . tiotropium  18 mcg Inhalation Daily    Continuous Infusions:  Time spent: 25 min  [facility] Pager [phone]. If 7PM-7AM, please contact night-coverage at [email or web address], Office  [phone]  password [place] 02/16/2016, 12:11 PM  LOS: 11 days

## 2016-02-16 NOTE — Clinical Social Work Placement (Signed)
CSW provided SNF bed offers - awaiting decision. Patient states that his niece, Allen Beasley plans to visit today & he hopes she will be able to tour facilities.    Raynaldo Opitz, [facility] Clinical Social Worker cell #: [phone]    CLINICAL SOCIAL WORK PLACEMENT  NOTE  Date:  02/16/2016  Patient Details  Name: Allen Beasley MRN: [id number] Date of Birth: 01/07/1924  Clinical Social Work is seeking post-discharge placement for this patient at the [place] level of care (*CSW will initial, date and re-position this form in  chart as items are completed):  Yes   Patient/family provided with [facility] Work Department's list of facilities offering this level of care within the geographic area requested by the patient (or if unable, by the patient's family).  Yes   Patient/family informed of their freedom to choose among providers that offer the needed level of care, that participate in Medicare, Medicaid or managed care program needed by the patient, have an available bed and are willing to accept the patient.  Yes   Patient/family informed of [facility]'s ownership interest in [facility] and [facility], as well as of the fact that they are under no obligation to receive care at these facilities.  PASRR submitted to EDS on       PASRR number received on       Existing PASRR number confirmed on 02/16/16     FL2 transmitted to all facilities in geographic area requested by pt/family on 02/16/16     FL2 transmitted to all facilities within larger geographic area on       Patient informed that his/her managed care company has contracts with or will negotiate with certain facilities, including the following:        Yes   Patient/family informed of bed offers received.  Patient chooses bed at       Physician recommends and patient chooses bed at      Patient to be transferred to   on  .  Patient to be transferred  to facility by       Patient family notified on   of transfer.  Name of family member notified:        PHYSICIAN       Additional Comment:    _______________________________________________ Standley Brooking, LCSW 02/16/2016, 2:01 PM

## 2016-02-16 NOTE — Progress Notes (Signed)
Physical Therapy Treatment Patient Details Name: Allen Beasley MRN: [id number] DOB: 1924-01-25 Today's Date: 02/16/2016    History of Present Illness Allen Beasley is a 82 y.o. male with medical history significant of COPD/emphysema, HTN, HMD, aortic insufficiency; who presents with complaints of shortness of breath.  route with EMS patient was given 125mg  Solumedrol, 10mg  Albuterol, and a DuoNeb treatment with mild relief of symptoms. Upon admission 1-/25/17 to emergency department patient was evaluated and seen to be febrile up to 100.41F, heart rate is 93-114, respirations 19-38, O2 saturations as low as 78%, and blood pressure seen as high as 194/77. Small left side pleural effusion.    PT Comments    Patient was seen in recliner upon arrival. Patient reported minor pain in bottom and was rated a 2-3/10 on the facial pain scale. Vitals before ambulation: HR 90, O2 89% on 4.5 L of O2. Sit to stand x S/min guard with verbal cues for safe hand placement when standing and before sitting. Gait x 125 ft x min guard requiring multiple rest breaks to maintain O2 sats above 88%. Vitals during ambulation: HR 101 O2 83%-91% on 6L of O2. Patient required verbal cues for pursed lip breathing focusing on the exhale and to practice pacing during ambulation. Following ambulation, patient's O2 recovered to 94% on 4.5L of O2.   Follow Up Recommendations  SNF     Equipment Recommendations  None recommended by PT    Recommendations for Other Services       Precautions / Restrictions Precautions Precautions: Fall Precaution Comments: on hiflow oxygen Restrictions Weight Bearing Restrictions: No    Mobility  Bed Mobility                  Transfers Overall transfer level: Needs assistance Equipment used: Rolling walker (2 wheeled) Transfers: Sit to/from Stand Sit to Stand: Supervision;Min guard;+2 safety/equipment         General transfer comment: impulsive, VC's for safety multiple  lines.  VC's for safe hand placement when standing and before sitting.   Ambulation/Gait Ambulation/Gait assistance: Min guard;+2 safety/equipment Ambulation Distance (Feet): 125 Feet Assistive device: Rolling walker (2 wheeled) Gait Pattern/deviations: Step-through pattern;Decreased step length - right;Decreased step length - left;Decreased stride length;Trunk flexed Gait velocity: decreased Gait velocity interpretation: Below normal speed for age/gender General Gait Details: several standing rest breaks to maintain O2 sats above 88%.   Stairs            Wheelchair Mobility    Modified Rankin (Stroke Patients Only)       Balance                                    Cognition Arousal/Alertness: Awake/alert Behavior During Therapy: WFL for tasks assessed/performed Overall Cognitive Status: Within Functional Limits for tasks assessed                      Exercises      General Comments        Pertinent Vitals/Pain Pain Assessment: Faces Faces Pain Scale: Hurts a little bit Pain Location: bottom Pain Intervention(s): Repositioned;Limited activity within patient's tolerance;Monitored during session    Home Living                      Prior Function            PT Goals (current goals can now be  found in the care plan section) Progress towards PT goals: Progressing toward goals    Frequency    Min 3X/week      PT Plan Current plan remains appropriate    Co-evaluation             End of Session Equipment Utilized During Treatment: Gait belt;Oxygen Activity Tolerance: Patient tolerated treatment well;Patient limited by fatigue Patient left: in chair;with call bell/phone within reach;with chair alarm set     Time:  - 12:05 - 12:24    Charges:    1 gt                   G CodesHall Busing, SPTA [facility] [phone]  Present during session and agree with above  Rica Koyanagi  PTA [facility]  Acute   Rehab Pager      [phone]

## 2016-02-17 DIAGNOSIS — K219 Gastro-esophageal reflux disease without esophagitis: Secondary | ICD-10-CM

## 2016-02-17 MED ORDER — LEVOFLOXACIN 750 MG PO TABS: 750.0000 mg | ORAL_TABLET | ORAL | 0 refills | Status: AC

## 2016-02-17 MED ORDER — LACTINEX PO CHEW: 2.0000 | CHEWABLE_TABLET | Freq: Three times a day (TID) | ORAL | 0 refills | Status: AC

## 2016-02-17 MED ORDER — FUROSEMIDE 20 MG PO TABS: 20.0000 mg | ORAL_TABLET | Freq: Every day | ORAL | 3 refills | Status: DC

## 2016-02-17 MED ORDER — SENNOSIDES-DOCUSATE SODIUM 8.6-50 MG PO TABS: 2.0000 | ORAL_TABLET | Freq: Two times a day (BID) | ORAL | 1 refills | Status: DC

## 2016-02-17 MED ORDER — PANTOPRAZOLE SODIUM 40 MG PO TBEC: 40.0000 mg | DELAYED_RELEASE_TABLET | Freq: Every day | ORAL | 2 refills | Status: AC

## 2016-02-17 MED ORDER — SUCRALFATE 1 GM/10ML PO SUSP: 1.0000 g | Freq: Three times a day (TID) | ORAL | 0 refills | Status: DC

## 2016-02-17 MED ORDER — POLYETHYLENE GLYCOL 3350 17 G PO PACK: 17.0000 g | PACK | Freq: Every day | ORAL | 0 refills | Status: AC

## 2016-02-17 MED ORDER — GUAIFENESIN ER 600 MG PO TB12: 600.0000 mg | ORAL_TABLET | Freq: Two times a day (BID) | ORAL | 0 refills | Status: AC

## 2016-02-17 MED ORDER — SUCRALFATE 1 GM/10ML PO SUSP: 1.0000 g | Freq: Two times a day (BID) | ORAL | 0 refills | Status: AC

## 2016-02-17 MED ORDER — NYSTATIN 100000 UNIT/ML MT SUSP: 5.0000 mL | Freq: Four times a day (QID) | OROMUCOSAL | 0 refills | Status: AC

## 2016-02-17 MED ORDER — LEVALBUTEROL HCL 0.63 MG/3ML IN NEBU: 0.6300 mg | INHALATION_SOLUTION | RESPIRATORY_TRACT | 12 refills | Status: AC | PRN

## 2016-02-17 MED ORDER — SUCRALFATE 1 GM/10ML PO SUSP: 1.0000 g | ORAL | Status: DC

## 2016-02-17 NOTE — Progress Notes (Signed)
Pt plan to discharge home with [facility] referral given.

## 2016-02-17 NOTE — Progress Notes (Signed)
Private Duty Care Agencies List given to pt.

## 2016-02-17 NOTE — Discharge Summary (Signed)
Physician Discharge Summary  KINTE GRIEVES [id number] DOB: 09-08-1923 DOA: 02/04/2016  PCP: Geoffery Lyons, MD  Admit date: 02/04/2016 Discharge date: 02/17/2016  Time spent: *25 minutes  Recommendations for Outpatient Follow-up:  1. Follow up PCP in 2 weeks 2. Follow up Pulmonary in 2 weeks 3. Patient to go home with HHPT/OT/RN/AIDE/Social work.   Discharge Diagnoses:  Principal Problem:   Sepsis ([place]) Active Problems:   CAP (community acquired pneumonia)   Acute respiratory failure with hypoxia (HCC)   BPH (benign prostatic hyperplasia)   Hyperglycemia   Goals of care, counseling/discussion   Palliative care encounter   Acute on chronic respiratory failure with hypoxia (HCC)   Necrotizing pneumonia (HCC)   Hypoxia   Esophageal reflux   Discharge Condition: Stable  Diet recommendation: Low salt diet  Filed Weights   02/05/16 0215 02/05/16 0500  Weight: 84.4 kg (186 lb 1.1 oz) 84.4 kg (186 lb 1.1 oz)    History of present illness:  82 year old male with a history of COPD/emphysema, hypertension, aortic insufficiency who was admitted with acute on chronic hypoxic respiratory failure, initially patient required BiPAP and was later transitioned to 6 L of nasal cannula to maintain O2 sats greater than 92%. Patient started on empiric antibiotics, IV Solu-Medrol and admitted to stepdown. Patient was seen by critical care medicine.  Hospital Course:  1. Acute on chronic hypoxic respiratory failure- improved, multifactorial from underlying emphysema, aspiration pneumonia. Prednisone stopped per [facility] M. He received 11 days of antibiotics in the hospital for  necrotizing pneumonia.Antibiotics were changed from Clindamycin to Flagyl,  As he developed rash with Clindamycin.Discussed with Dr Elsworth Soho, will discharge him on Po Levaquin 750 mg  Daily for 10 more days. 2. Odynophagia- ? component of candida esophagitis, started on nystatin 500,000 units oral 4 times daily. Continue  Carafate, Nystatin for 7 more days and continue Protonix. 3. BPH- continue Hytrin 4. Hypertension- blood pressure controlled 5. Moderate bilateral pleural effusions- patient started on Lasix, echogram showed LVH, EF [id number], grade 1 diastolic dysfunction. Will discharge home on Po lasix 20 mg daily. 6. Hematuria- UA on 02/06/2016 showed significant RBCs, unclear etiology. No hydronephrosis on CT abdomen and pelvis. Urine cultures are negative. Lovenox has been stopped. Might need urology evaluation as outpatient. Hemoglobin has been stable in the hospital 7. Disposition he does not want to go to SNF, will go home on oxygen, HHPT/OT/RN/AIDE/Social work.  Procedures:  None   Consultations:  PCCM  Discharge Exam: Vitals:   02/16/16 2047 02/17/16 0600  BP: 137/62 (!) 112/46  Pulse: 90 83  Resp: 16 16  Temp: 98.8 F (37.1 C) 98.3 F (36.8 C)    General: Appears in no acute distress Cardiovascular: RRR, no murmurs Respiratory: Scattered wheezing bilaterally  Discharge Instructions   Discharge Instructions    Diet - low sodium heart healthy    Complete by:  As directed    Increase activity slowly    Complete by:  As directed      Current Discharge Medication List    START taking these medications   Details  furosemide (LASIX) 20 MG tablet Take 1 tablet (20 mg total) by mouth daily. Qty: 30 tablet, Refills: 3    guaiFENesin (MUCINEX) 600 MG 12 hr tablet Take 1 tablet (600 mg total) by mouth 2 (two) times daily. Qty: 30 tablet, Refills: 0    lactobacillus acidophilus & bulgar (LACTINEX) chewable tablet Chew 2 tablets by mouth 3 (three) times daily with meals. Qty: 30 tablet, Refills: 0  levalbuterol (XOPENEX) 0.63 MG/3ML nebulizer solution Take 3 mLs (0.63 mg total) by nebulization every 4 (four) hours as needed for wheezing or shortness of breath. Qty: 3 mL, Refills: 12    levofloxacin (LEVAQUIN) 750 MG tablet Take 1 tablet (750 mg total) by mouth daily. Qty: 10 tablet,  Refills: 0    nystatin (MYCOSTATIN) 100000 UNIT/ML suspension Take 5 mLs (500,000 Units total) by mouth 4 (four) times daily. Qty: 60 mL, Refills: 0    pantoprazole (PROTONIX) 40 MG tablet Take 1 tablet (40 mg total) by mouth daily at 6 (six) AM. Qty: 30 tablet, Refills: 2    polyethylene glycol (MIRALAX / GLYCOLAX) packet Take 17 g by mouth daily. Qty: 14 each, Refills: 0    senna-docusate (SENOKOT-S) 8.6-50 MG tablet Take 2 tablets by mouth 2 (two) times daily. Qty: 30 tablet, Refills: 1    sucralfate (CARAFATE) 1 GM/10ML suspension Take 10 mLs (1 g total) by mouth 4 (four) times daily -  with meals and at bedtime. Qty: 420 mL, Refills: 0      CONTINUE these medications which have NOT CHANGED   Details  simvastatin (ZOCOR) 80 MG tablet Take 80 mg by mouth daily.    SPIRIVA HANDIHALER 18 MCG inhalation capsule PLACE 1 CAPSULE INTO INHALER AND INHALE THE CONTENTS OF 1 CAPSULE ONCE DAILY Qty: 90 capsule, Refills: 1    terazosin (HYTRIN) 2 MG capsule Take 2 mg by mouth at bedtime.        STOP taking these medications     doxycycline (VIBRAMYCIN) 100 MG capsule        Allergies  Allergen Reactions  . Penicillins Hives and Swelling   Follow-up Information    Magdalen Spatz, NP Follow up on 03/01/2016.   Specialty:  Pulmonary Disease Why:  Appt at 11:00 AM  Contact information: [address] [place] [address] [phone]            The results of significant diagnostics from this hospitalization (including imaging, microbiology, ancillary and laboratory) are listed below for reference.    Significant Diagnostic Studies: Ct Abdomen Pelvis Wo Contrast  Result Date: 02/05/2016 CLINICAL DATA:  Shortness of breath wheezing distended abdomen EXAM: CT ABDOMEN AND PELVIS WITHOUT CONTRAST TECHNIQUE: Multidetector CT imaging of the abdomen and pelvis was performed following the standard protocol without IV contrast. COMPARISON:  06/19/2013, radiograph  02/04/2016 FINDINGS: Lower chest: Small left-sided pleural effusion. Moderate emphysematous disease at the bilateral lung bases. Mild right middle lobe atelectasis. Patchy right lower lobe atelectasis. Partial consolidation in the posterior left lower lobe. Partial consolidation within the lingula. Coronary artery calcifications. Aortic valvular calcifications. Mitral valvular calcifications. Heart size is slightly enlarged. Hepatobiliary: No focal hepatic abnormalities. Mild increased density in the gallbladder lumen could reflect small amount of sludge or stones. No biliary dilatation. Pancreas: Pancreas is atrophic.  No inflammatory changes. Spleen: Normal in size without focal abnormality. Adrenals/Urinary Tract: Mild nodular thickening of the adrenal glands without discrete nodule. No hydronephrosis. Small stones are vascular calcifications within the left kidney. The bladder demonstrates right posterior lobulated contour. Stomach/Bowel: No significant bowel dilatation. No obstruction. Scattered colon diverticula without acute inflammation. Vascular/Lymphatic: Multifocal ectasia of the abdominal aorta. Atherosclerotic vascular disease. No pathologically enlarged retroperitoneal or mesenteric lymph nodes. Reproductive: Prostate gland is enlarged with calcifications. Other: No free air or free fluid. Musculoskeletal: Status post right hip replacement with no acute dislocation. Multilevel degenerative changes of the spine with marked posterior facet changes. IMPRESSION: 1. No  CT evidence for bowel obstruction. 2. Small left-sided pleural effusion. Moderate emphysematous disease at the bilateral lung bases. Partial consolidation in the left lower lobe and lingula, possible pneumonia. Continued imaging follow-up recommended to document resolution. 3. Mild cardiomegaly. 4. Atherosclerotic vascular disease of the aorta and branch vessels. Electronically Signed   By: Donavan Foil M.D.   On: 02/05/2016 00:31   Dg  Chest 2 View  Result Date: 02/11/2016 CLINICAL DATA:  Acute on chronic respiratory failure with hypoxia patient reports shortness of breath and weakness today. History of COPD, aortic insufficiency. EXAM: CHEST  2 VIEW COMPARISON:  Chest x-ray and chest CT scan of February 09, 2016 FINDINGS: The lungs remain hyperinflated. There remains increased density at the left lung base compatible with pneumonia and a known pleural effusion. The heart and pulmonary vascularity are normal. There is calcification in the wall of the thoracic aorta. The mediastinum is normal in width. The bony thorax exhibits no acute abnormality. IMPRESSION: Persistent left lower lobe pneumonia and pleural effusion not greatly changed from the previous study. Underlying COPD. No CHF. Aortic atherosclerosis. Electronically Signed   By: David  Martinique M.D.   On: 02/11/2016 12:40   Dg Chest 2 View  Result Date: 02/09/2016 CLINICAL DATA:  Shortness of breath and wheezing. EXAM: CHEST  2 VIEW COMPARISON:  02/04/2016 and CT chest 06/19/2013. FINDINGS: Patient is rotated. Heart size stable. Thoracic aorta is calcified. Lungs are emphysematous. Airspace opacification is seen at the base of the left hemi thorax with a small left pleural effusion. Right lung is grossly clear. Tiny right pleural effusion. Degenerative changes are seen in the spine. IMPRESSION: 1. Left basilar airspace opacification may be due to pneumonia. Followup PA and lateral chest X-ray is recommended in 3-4 weeks following trial of antibiotic therapy to ensure resolution and exclude underlying malignancy. 2. Small bilateral pleural effusions, left greater than right. 3.  Aortic atherosclerosis (ICD10-170.0). Electronically Signed   By: Lorin Picket M.D.   On: 02/09/2016 08:58   Dg Abd 1 View  Result Date: 02/06/2016 CLINICAL DATA:  New onset vomiting and mid abdominal pain with bloating EXAM: ABDOMEN - 1 VIEW COMPARISON:  CT 02/04/2016 FINDINGS: Small left-sided pleural  effusion. Bibasilar atelectasis or infiltrates. Moderate air distention of the stomach. Bowel gas pattern nonspecific. Moderate stool right colon. Status post right hip replacement. IMPRESSION: 1. Small left-sided pleural effusion with bibasilar infiltrates. 2. Moderate air distention of the stomach. Overall gas pattern nonspecific. Electronically Signed   By: Donavan Foil M.D.   On: 02/06/2016 03:40   Ct Angio Chest Pe W Or Wo Contrast  Result Date: 02/09/2016 CLINICAL DATA:  Aortic insufficiency sepsis, COPD exacerbation with acute respiratory failure and hypoxia EXAM: CT ANGIOGRAPHY CHEST WITH CONTRAST TECHNIQUE: Multidetector CT imaging of the chest was performed using the standard protocol during bolus administration of intravenous contrast. Multiplanar CT image reconstructions and MIPs were obtained to evaluate the vascular anatomy. CONTRAST:  100 mL Isovue 370 intravenous COMPARISON:  Chest x-ray 01/1929 2017, CT scan chest 06/19/2013 FINDINGS: Cardiovascular: No evidence for filling defect within the central or segmental pulmonary arteries to suggest the presence of an acute embolus. There is diffuse ectasia of the thoracic aorta with atherosclerosis. Coronary artery calcifications. No dissection or mediastinal hematoma. Heart size is nonenlarged. Trace pericardial effusion or thickening. Mediastinum/Nodes: There is mild thickening of the esophagus. Nonspecific sub cm mediastinal lymph nodes. No enlarged hilar or axillary nodes. Trachea and mainstem bronchi are within normal limits. Lungs/Pleura: Trace right-sided pleural  effusion. Small to moderate left-sided pleural effusion with mild loculation at the left lower lobe apex. Marked emphysematous disease within the bilateral lungs. There is a focal subpleural airspace density in the lingula which demonstrates central low attenuation suggesting necrosis. There is atelectasis or infiltrate within the left lower lobe. There is streaky infiltrate in the  right lower lobe. Airspace disease within the lingula. No pneumothorax. Upper Abdomen: Images through the upper abdomen demonstrate no acute abnormalities. Left adrenal nodularity unchanged. Musculoskeletal: Multilevel degenerative changes of the spine with exuberant anterior osteophytes at the cervical thoracic junction. Review of the MIP images confirms the above findings. IMPRESSION: 1. No CT evidence for acute pulmonary embolus or dissection. Mild diffuse ectasia of the thoracic aorta with atherosclerosis. 2. Trace right-sided pleural effusion, small to moderate left-sided pleural effusion with mild loculation at the apical portion of left lower lobe. Hazy infiltrate in the right lower lobe and atelectasis or mild infiltrate left lower lobe. Focal subpleural masslike area of consolidation within the lingula which demonstrates central low attenuation. This could represent a focus of necrotic pneumonia or a possible septic embolus. 3. Marked emphysematous disease of the bilateral lungs Electronically Signed   By: Donavan Foil M.D.   On: 02/09/2016 21:59   Ct Abdomen Pelvis W Contrast  Result Date: 02/06/2016 CLINICAL DATA:  Sepsis. Community acquired pneumonia. Acute respiratory failure. Nausea vomiting and abdominal pain. EXAM: CT ABDOMEN AND PELVIS WITH CONTRAST TECHNIQUE: Multidetector CT imaging of the abdomen and pelvis was performed using the standard protocol following bolus administration of intravenous contrast. CONTRAST:  167mL ISOVUE-300 IOPAMIDOL (ISOVUE-300) INJECTION 61% COMPARISON:  Abdominal radiograph 02/06/2016 FINDINGS: Lower chest: Bilateral medium-sized pleural effusions, left-greater-than-right, with associated atelectasis. Consolidative opacities within the lingula. Coronary artery calcifications are noted. Hepatobiliary: Normal hepatic size and contours without focal liver lesion. No perihepatic ascites. No intra- or extrahepatic biliary dilatation. Normal gallbladder. Pancreas:  Senescent fatty atrophy of the pancreas. No pancreatic ductal dilatation or peripancreatic fluid collection. Spleen: Normal. Adrenals/Urinary Tract: Normal adrenal glands. No hydronephrosis or solid renal mass. Stomach/Bowel: There is a large stool ball within the rectum, measuring up to 7 cm. There is colonic interposition anterior to the liver. Despite review of multiplanar reformatted images in 3 orthogonal planes, the appendix could not be adequately visualized. However, there is no inflammatory stranding or free fluid within the right lower quadrant. There is no intra-abdominal fluid collection. No evidence of acute colonic or enteric inflammation. No portal venous gas. Vascular/Lymphatic: There is extensive atherosclerotic calcification of the abdominal aorta. Calcification extends into the iliac artery is. The major branches of the abdominal aorta are proximally patent. The portal vein, superior mesenteric vein and splenic vein are patent. No abdominal or pelvic adenopathy. Reproductive: Assessment of the prostate is limited by streak artifact from right hip prosthesis. Musculoskeletal: There is a right total hip arthroplasty. There are bridging osteophytes at both sacroiliac joints. Multilevel severe facet arthrosis with narrowing of the spinal canal at L2-L3. Grade 1 L4-L5 anterolisthesis. No lytic or blastic lesions. Normal visualized extrathoracic and extraperitoneal soft tissues. Other: No contributory non-categorized findings. IMPRESSION: 1. Bilateral medium-sized pleural effusions with associated atelectasis. Consolidative opacity within the lingula, which could indicate pneumonia. 2. Large stool ball within the rectum without evidence of stercoral inflammation. 3. Aortic atherosclerosis. Electronically Signed   By: Ulyses Jarred M.D.   On: 02/06/2016 18:00   Dg Esophagus  Result Date: 02/10/2016 CLINICAL DATA:  Dysphagia.  Choked on foods this morning EXAM: ESOPHOGRAM/BARIUM SWALLOW TECHNIQUE:  Single  contrast examination was performed using  thin barium. FLUOROSCOPY TIME:  Fluoroscopy Time:  54 seconds Radiation Exposure Index (if provided by the fluoroscopic device): 3.8 mGy Number of Acquired Spot Images: 0 COMPARISON:  Chest CT 02/09/2016 FINDINGS: Fluoroscopic evaluation of swallowing demonstrates disruption of all primary esophageal peristaltic waves with stasis of contrast in the esophagus and occasional distal tertiary contractions. No fixed stricture, fold thickening or mass. IMPRESSION: Esophageal dysmotility.  No fixed stricture. Electronically Signed   By: Rolm Baptise M.D.   On: 02/10/2016 14:36   Dg Chest Portable 1 View  Result Date: 02/04/2016 CLINICAL DATA:  82 year old male with fever and hypoxemia. EXAM: PORTABLE CHEST 1 VIEW COMPARISON:  Chest CT dated 06/19/2013 FINDINGS: There is emphysematous changes of the lungs. Small left pleural effusion with associated left lung base compressive atelectasis versus infiltrate. There bibasilar atelectatic changes/ scarring. No pneumothorax. There is mild cardiomegaly. The aorta is tortuous. There is atherosclerotic calcification of the thoracic aorta. Old healed left rib fracture. No acute fracture. IMPRESSION: Small left pleural effusion with associated left lung base atelectasis versus infiltrate. Clinical correlation and follow-up resolution recommended. Emphysema. Electronically Signed   By: Anner Crete M.D.   On: 02/04/2016 22:56    Microbiology: No results found for this or any previous visit (from the past 240 hour(s)).   Labs: Basic Metabolic Panel:  Recent Labs Lab 02/11/16 0331 02/13/16 0316 02/15/16 0514 02/16/16 1302  NA 137 133* 134* 131*  K 4.1 3.7 3.6 3.7  CL 91* 89* 89* 88*  CO2 39* 37* 36* 34*  GLUCOSE 171* 166* 138* 191*  BUN 47* 36* 25* 22*  CREATININE 0.79 0.71 0.68 0.79  CALCIUM 8.6* 8.2* 8.1* 8.4*   Liver Function Tests:  Recent Labs Lab 02/15/16 0514  AST 18  ALT 16*  ALKPHOS 43   BILITOT 0.8  PROT 5.2*  ALBUMIN 2.5*   No results for input(s): LIPASE, AMYLASE in the last 168 hours. No results for input(s): AMMONIA in the last 168 hours. CBC:  Recent Labs Lab 02/13/16 0316 02/15/16 0514 02/16/16 1302  WBC 15.1* 13.8* 15.1*  HGB 13.4 12.5* 13.9  HCT 42.4 39.2 43.5  MCV 90.0 89.1 89.3  PLT 240 220 221       Signed:  Davon Folta S MD.  [facility] 02/17/2016, 9:49 AM

## 2016-02-17 NOTE — Progress Notes (Addendum)
SATURATION QUALIFICATIONS: (This note is used to comply with regulatory documentation for home oxygen)  Patient Saturations on Room Air at Rest = 85%  Patient Saturations on Room Air while Ambulating = 79%  Patient Saturations on  5 Liters (oxymizer) of oxygen while Ambulating = 94%  Please briefly explain why patient needs home oxygen:

## 2016-02-17 NOTE — Progress Notes (Signed)
PHARMACY NOTE:   DISCHARGE MEDICATION REVIEW  Discharge medications include sucralfate 1g PO QID and levofloxacin 750mg  PO daily.  Levofloxacin absorption can be substantially reduced by sucralfate if sucralfate is given within 2 hours before or after the Levaquin dose. Adjusting schedule of sucralfate to avoid the interaction can be challenging with sucralfate on a QID schedule.  Spoke with Dr. Darrick Meigs and received orders to reduce sucralfate to 1g BID (breakfast and bedtime) with levofloxacin given near middle of day to assure adequate antibiotic absorption.  He requested I review these new instructions with the patient.  Reviewed with patient the new dosing instructions for sucralfate including dosage timing to avoid drug-drug interaction.  Patient expressed understanding and expressed appreciation for the information.  Clayburn Pert, PharmD, BCPS Pager: [phone] 02/17/2016  10:41 AM

## 2016-02-17 NOTE — Progress Notes (Signed)
CSW met with patient to discuss SNF decision - patient now states that he has decided he wants to just return home at discharge. RNCM, Cookie made aware.   No further CSW needs identified - CSW signing off.   Kelly Harrison, LCSW [facility] Clinical Social Worker cell #: [phone]   

## 2016-02-17 NOTE — Progress Notes (Signed)
Went over all discharge information with patient and niece.  All questions answered.  Discharge summary given to niece.  O2 delivered by [facility] home [facility].  Pt discharged via wheelchair.

## 2016-02-19 DIAGNOSIS — I350 Nonrheumatic aortic (valve) stenosis: Secondary | ICD-10-CM | POA: Diagnosis not present

## 2016-02-19 DIAGNOSIS — E785 Hyperlipidemia, unspecified: Secondary | ICD-10-CM | POA: Diagnosis not present

## 2016-02-19 DIAGNOSIS — I10 Essential (primary) hypertension: Secondary | ICD-10-CM | POA: Diagnosis not present

## 2016-02-19 DIAGNOSIS — Z96641 Presence of right artificial hip joint: Secondary | ICD-10-CM | POA: Diagnosis not present

## 2016-02-19 DIAGNOSIS — Z9981 Dependence on supplemental oxygen: Secondary | ICD-10-CM | POA: Diagnosis not present

## 2016-02-19 DIAGNOSIS — Z87891 Personal history of nicotine dependence: Secondary | ICD-10-CM | POA: Diagnosis not present

## 2016-02-19 DIAGNOSIS — K219 Gastro-esophageal reflux disease without esophagitis: Secondary | ICD-10-CM | POA: Diagnosis not present

## 2016-02-19 DIAGNOSIS — J440 Chronic obstructive pulmonary disease with acute lower respiratory infection: Secondary | ICD-10-CM | POA: Diagnosis not present

## 2016-02-19 DIAGNOSIS — J189 Pneumonia, unspecified organism: Secondary | ICD-10-CM | POA: Diagnosis not present

## 2016-02-19 DIAGNOSIS — N400 Enlarged prostate without lower urinary tract symptoms: Secondary | ICD-10-CM | POA: Diagnosis not present

## 2016-02-19 NOTE — Progress Notes (Signed)
A call from charge nurse revealed that [facility] had not been to seen pt/niece Allen Beasley [id number].  A call to in rep Manuela Schwartz revealed that [facility] had referral, had called the pt several times with no answer until 02/18/2016.  Pt will be seen today by [facility]. Patricia's number given to [facility] in house rep.

## 2016-02-21 DIAGNOSIS — J440 Chronic obstructive pulmonary disease with acute lower respiratory infection: Secondary | ICD-10-CM | POA: Diagnosis not present

## 2016-02-21 DIAGNOSIS — I350 Nonrheumatic aortic (valve) stenosis: Secondary | ICD-10-CM | POA: Diagnosis not present

## 2016-02-21 DIAGNOSIS — E785 Hyperlipidemia, unspecified: Secondary | ICD-10-CM | POA: Diagnosis not present

## 2016-02-21 DIAGNOSIS — I10 Essential (primary) hypertension: Secondary | ICD-10-CM | POA: Diagnosis not present

## 2016-02-21 DIAGNOSIS — J189 Pneumonia, unspecified organism: Secondary | ICD-10-CM | POA: Diagnosis not present

## 2016-02-21 DIAGNOSIS — N400 Enlarged prostate without lower urinary tract symptoms: Secondary | ICD-10-CM | POA: Diagnosis not present

## 2016-02-22 DIAGNOSIS — E785 Hyperlipidemia, unspecified: Secondary | ICD-10-CM | POA: Diagnosis not present

## 2016-02-22 DIAGNOSIS — N400 Enlarged prostate without lower urinary tract symptoms: Secondary | ICD-10-CM | POA: Diagnosis not present

## 2016-02-22 DIAGNOSIS — J440 Chronic obstructive pulmonary disease with acute lower respiratory infection: Secondary | ICD-10-CM | POA: Diagnosis not present

## 2016-02-22 DIAGNOSIS — J189 Pneumonia, unspecified organism: Secondary | ICD-10-CM | POA: Diagnosis not present

## 2016-02-22 DIAGNOSIS — I10 Essential (primary) hypertension: Secondary | ICD-10-CM | POA: Diagnosis not present

## 2016-02-22 DIAGNOSIS — I350 Nonrheumatic aortic (valve) stenosis: Secondary | ICD-10-CM | POA: Diagnosis not present

## 2016-02-23 ENCOUNTER — Other Ambulatory Visit: Payer: Self-pay

## 2016-02-23 NOTE — Patient Outreach (Signed)
[facility]) Care Management  02/23/2016  Allen Beasley [birth year] [id number]  REFERRAL DATE; 02/23/16 REFERRAL SOURCE: EMMI pneumonia REFERRAL REASON: EMMI pneumonia red alert for: "Been to follow up appointment: NO" "Swelling in hands/ feet or changes in weight: YES" CONSENT'; Self/ patient. Patient gave authorization to speak with his niece/ caregiver, Allen Beasley regarding all of his personal health information.  SUBJECTIVE:  Telephone call to patient regarding EMMI referral. Explained to patient and his niece purpose of call was for EMMI pneumonia program follow up.  HIPAA verified with patient. Patient confirmed he was discharged on 02/17/16 due to pneumonia. Patient states his home physical therapy had not started.  Patient states, "I've been out of the hospital for a week and my physical therapy hasn't started."  Patient states the physical therapist would be coming tomorrow 02/24/16. Patient reports home health nurse would be coming on 02/25/16.  Niece states the home health social worker saw patient but he did not need any services.  Patient states his feet and ankles are swelling.  RNCM spoke with patients niece, Ms. Allen Beasley.  Niece states patients feet/ankles have been swollen over the last couple of days. RNCM advised niece to call patients primary MD office today to notify of increased swelling.  Niece called primary MD office from another phone while RNCM still on the line.  Niece left message for call back.   States the swelling is worse than it was this morning. RNCM advised niece to make sure patient is elevating his feet when sitting and eating a low sodium/ salt diet.  Niece states patients oxygen saturation is at 92% today.  States patient continues with oxygen at 2L around the clock.  States patients oxygen level has decreased from 6L to 2 L.  Denies patient having any unusual shortness of breath. Niece states patient has an appointment with his primary MD on  02/25/16 and a follow up with the pulmonologist on 03/01/16.  Niece states patient is using his incentive spirometry every day and is using his nebulizer as directed by his doctor.  Niece states patient is eating well, going to the bathroom without problems, walking better and using his walker.  Niece states patient is up mostly during the day.  States patient is watching his favorite TV program and doing his stamp collection. Niece states patient lost between 15-20lbs while he was in the hospital. States patient is eating very well now. Niece states patient was independent prior to this hospitalization for pneumonia.  Niece states patient has a strong family support.  Niece states she will be with patient until March 15, 2016 and then patients two sons will come and stay with him alternating time.  Niece agreed to next telephone follow up with RNCM.   ASSESSMENT: EMMI pneumonia transition program follow up  PLAN:  RNCM will follow up with patient/ niece within 1 week.   Quinn Plowman RN,BSN,CCM [facility] Telephonic  [phone]

## 2016-02-24 DIAGNOSIS — E785 Hyperlipidemia, unspecified: Secondary | ICD-10-CM | POA: Diagnosis not present

## 2016-02-24 DIAGNOSIS — J440 Chronic obstructive pulmonary disease with acute lower respiratory infection: Secondary | ICD-10-CM | POA: Diagnosis not present

## 2016-02-24 DIAGNOSIS — J449 Chronic obstructive pulmonary disease, unspecified: Secondary | ICD-10-CM | POA: Diagnosis not present

## 2016-02-24 DIAGNOSIS — J441 Chronic obstructive pulmonary disease with (acute) exacerbation: Secondary | ICD-10-CM | POA: Diagnosis not present

## 2016-02-24 DIAGNOSIS — J850 Gangrene and necrosis of lung: Secondary | ICD-10-CM | POA: Diagnosis not present

## 2016-02-24 DIAGNOSIS — I10 Essential (primary) hypertension: Secondary | ICD-10-CM | POA: Diagnosis not present

## 2016-02-24 DIAGNOSIS — Z23 Encounter for immunization: Secondary | ICD-10-CM | POA: Diagnosis not present

## 2016-02-24 DIAGNOSIS — R8290 Unspecified abnormal findings in urine: Secondary | ICD-10-CM | POA: Diagnosis not present

## 2016-02-24 DIAGNOSIS — Z9981 Dependence on supplemental oxygen: Secondary | ICD-10-CM | POA: Diagnosis not present

## 2016-02-24 DIAGNOSIS — R600 Localized edema: Secondary | ICD-10-CM | POA: Diagnosis not present

## 2016-02-24 DIAGNOSIS — N400 Enlarged prostate without lower urinary tract symptoms: Secondary | ICD-10-CM | POA: Diagnosis not present

## 2016-02-24 DIAGNOSIS — Z6825 Body mass index (BMI) 25.0-25.9, adult: Secondary | ICD-10-CM | POA: Diagnosis not present

## 2016-02-24 DIAGNOSIS — I350 Nonrheumatic aortic (valve) stenosis: Secondary | ICD-10-CM | POA: Diagnosis not present

## 2016-02-24 DIAGNOSIS — J189 Pneumonia, unspecified organism: Secondary | ICD-10-CM | POA: Diagnosis not present

## 2016-02-25 DIAGNOSIS — J189 Pneumonia, unspecified organism: Secondary | ICD-10-CM | POA: Diagnosis not present

## 2016-02-25 DIAGNOSIS — I10 Essential (primary) hypertension: Secondary | ICD-10-CM | POA: Diagnosis not present

## 2016-02-25 DIAGNOSIS — I350 Nonrheumatic aortic (valve) stenosis: Secondary | ICD-10-CM | POA: Diagnosis not present

## 2016-02-25 DIAGNOSIS — N400 Enlarged prostate without lower urinary tract symptoms: Secondary | ICD-10-CM | POA: Diagnosis not present

## 2016-02-25 DIAGNOSIS — J440 Chronic obstructive pulmonary disease with acute lower respiratory infection: Secondary | ICD-10-CM | POA: Diagnosis not present

## 2016-02-25 DIAGNOSIS — E785 Hyperlipidemia, unspecified: Secondary | ICD-10-CM | POA: Diagnosis not present

## 2016-02-26 ENCOUNTER — Other Ambulatory Visit: Payer: Self-pay

## 2016-02-26 DIAGNOSIS — I350 Nonrheumatic aortic (valve) stenosis: Secondary | ICD-10-CM | POA: Diagnosis not present

## 2016-02-26 DIAGNOSIS — J440 Chronic obstructive pulmonary disease with acute lower respiratory infection: Secondary | ICD-10-CM | POA: Diagnosis not present

## 2016-02-26 DIAGNOSIS — J189 Pneumonia, unspecified organism: Secondary | ICD-10-CM | POA: Diagnosis not present

## 2016-02-26 DIAGNOSIS — I10 Essential (primary) hypertension: Secondary | ICD-10-CM | POA: Diagnosis not present

## 2016-02-26 DIAGNOSIS — N400 Enlarged prostate without lower urinary tract symptoms: Secondary | ICD-10-CM | POA: Diagnosis not present

## 2016-02-26 DIAGNOSIS — E785 Hyperlipidemia, unspecified: Secondary | ICD-10-CM | POA: Diagnosis not present

## 2016-02-26 NOTE — Patient Outreach (Signed)
[facility]) Care Management  02/26/2016  Allen Beasley 05/06/1923 [id number]   REFERRAL DATE; 02/23/16 REFERRAL SOURCE: EMMI pneumonia REFERRAL REASON: EMMI pneumonia red alert for: "Been to follow up appointment: NO" "Swelling in hands/ feet or changes in weight: YES" CONSENT'; Self/ patient. Patient gave authorization to speak with his niece/ caregiver, Allen Beasley regarding all of his personal health information.  SUBJECTIVE; Telephone call to patient regarding EMMI Pneumonia red alert. HIPAA verified with patient.  Patient reports he is doing pretty good. Patient states he is still having some swelling in his feet/ ankles.  Patient states he saw his primary MD regarding the swelling in his feet/ankles legs on Monday 02/23/16.  Patient states his doctor prescribed TED hose. Patient state he has started wearing the hose.  States he notices a difference in the swelling.  States swelling has gotten a little better. Patient states he continues to elevate his feet when sitting. Patient denies any additional change in his treatment plan. Patient reports he continues to take his same medications.  Patient reports he is receiving his services with home health.  Patient reports he has his physical therapy today.  Patient denies any shortness of breath, cough, fever. Patient states he just took his oxygen saturation reading and it was 92-93% on room air. Patient states he continues to wear his oxygen and takes his nebulizer treatments as prescribed.   ASSESSMENT: EMMI pneumonia red alert referral.  PLAN:  RNCN will follow up with patient within 1 week.   Quinn Plowman RN,BSN,CCM [facility] Telephonic  [phone]

## 2016-02-27 DIAGNOSIS — J441 Chronic obstructive pulmonary disease with (acute) exacerbation: Secondary | ICD-10-CM | POA: Diagnosis not present

## 2016-02-27 DIAGNOSIS — J850 Gangrene and necrosis of lung: Secondary | ICD-10-CM | POA: Diagnosis not present

## 2016-02-27 DIAGNOSIS — R112 Nausea with vomiting, unspecified: Secondary | ICD-10-CM | POA: Diagnosis not present

## 2016-02-27 DIAGNOSIS — K5900 Constipation, unspecified: Secondary | ICD-10-CM | POA: Diagnosis not present

## 2016-02-28 ENCOUNTER — Emergency Department (HOSPITAL_COMMUNITY): Payer: Medicare Other

## 2016-02-28 ENCOUNTER — Encounter (HOSPITAL_COMMUNITY)
Admission: EM | Disposition: A | Discharge status: Nursing Home (Includes ICF) | Payer: Self-pay | Source: Home / Self Care | Attending: Internal Medicine

## 2016-02-28 ENCOUNTER — Inpatient Hospital Stay (HOSPITAL_COMMUNITY): Payer: Medicare Other | Admitting: Registered Nurse

## 2016-02-28 ENCOUNTER — Inpatient Hospital Stay (HOSPITAL_COMMUNITY)
Admission: EM | Admit: 2016-02-28 | Discharge: 2016-03-09 | DRG: 350 | Disposition: A | Discharge status: Other Acute Inpatient Hospital | Payer: Medicare Other | Attending: Internal Medicine | Admitting: Internal Medicine

## 2016-02-28 ENCOUNTER — Encounter (HOSPITAL_COMMUNITY): Payer: Self-pay

## 2016-02-28 ENCOUNTER — Inpatient Hospital Stay (HOSPITAL_COMMUNITY): Payer: Medicare Other

## 2016-02-28 DIAGNOSIS — I10 Essential (primary) hypertension: Secondary | ICD-10-CM | POA: Diagnosis present

## 2016-02-28 DIAGNOSIS — K56609 Unspecified intestinal obstruction, unspecified as to partial versus complete obstruction: Secondary | ICD-10-CM | POA: Diagnosis not present

## 2016-02-28 DIAGNOSIS — Z8249 Family history of ischemic heart disease and other diseases of the circulatory system: Secondary | ICD-10-CM | POA: Diagnosis not present

## 2016-02-28 DIAGNOSIS — R0989 Other specified symptoms and signs involving the circulatory and respiratory systems: Secondary | ICD-10-CM

## 2016-02-28 DIAGNOSIS — J439 Emphysema, unspecified: Secondary | ICD-10-CM | POA: Diagnosis present

## 2016-02-28 DIAGNOSIS — J9621 Acute and chronic respiratory failure with hypoxia: Secondary | ICD-10-CM | POA: Diagnosis not present

## 2016-02-28 DIAGNOSIS — I9581 Postprocedural hypotension: Secondary | ICD-10-CM | POA: Diagnosis not present

## 2016-02-28 DIAGNOSIS — Z809 Family history of malignant neoplasm, unspecified: Secondary | ICD-10-CM | POA: Diagnosis not present

## 2016-02-28 DIAGNOSIS — Z87891 Personal history of nicotine dependence: Secondary | ICD-10-CM | POA: Diagnosis not present

## 2016-02-28 DIAGNOSIS — Z9842 Cataract extraction status, left eye: Secondary | ICD-10-CM

## 2016-02-28 DIAGNOSIS — I959 Hypotension, unspecified: Secondary | ICD-10-CM | POA: Diagnosis present

## 2016-02-28 DIAGNOSIS — J850 Gangrene and necrosis of lung: Secondary | ICD-10-CM | POA: Diagnosis present

## 2016-02-28 DIAGNOSIS — D72829 Elevated white blood cell count, unspecified: Secondary | ICD-10-CM | POA: Diagnosis not present

## 2016-02-28 DIAGNOSIS — R0602 Shortness of breath: Secondary | ICD-10-CM | POA: Diagnosis not present

## 2016-02-28 DIAGNOSIS — K5669 Other partial intestinal obstruction: Secondary | ICD-10-CM | POA: Diagnosis not present

## 2016-02-28 DIAGNOSIS — T502X5A Adverse effect of carbonic-anhydrase inhibitors, benzothiadiazides and other diuretics, initial encounter: Secondary | ICD-10-CM | POA: Diagnosis not present

## 2016-02-28 DIAGNOSIS — Z880 Allergy status to penicillin: Secondary | ICD-10-CM

## 2016-02-28 DIAGNOSIS — Y95 Nosocomial condition: Secondary | ICD-10-CM | POA: Diagnosis present

## 2016-02-28 DIAGNOSIS — E785 Hyperlipidemia, unspecified: Secondary | ICD-10-CM | POA: Diagnosis present

## 2016-02-28 DIAGNOSIS — J449 Chronic obstructive pulmonary disease, unspecified: Secondary | ICD-10-CM | POA: Diagnosis not present

## 2016-02-28 DIAGNOSIS — J690 Pneumonitis due to inhalation of food and vomit: Secondary | ICD-10-CM | POA: Diagnosis present

## 2016-02-28 DIAGNOSIS — I351 Nonrheumatic aortic (valve) insufficiency: Secondary | ICD-10-CM | POA: Diagnosis present

## 2016-02-28 DIAGNOSIS — R918 Other nonspecific abnormal finding of lung field: Secondary | ICD-10-CM | POA: Diagnosis not present

## 2016-02-28 DIAGNOSIS — R0902 Hypoxemia: Secondary | ICD-10-CM | POA: Diagnosis not present

## 2016-02-28 DIAGNOSIS — Z881 Allergy status to other antibiotic agents status: Secondary | ICD-10-CM

## 2016-02-28 DIAGNOSIS — J432 Centrilobular emphysema: Secondary | ICD-10-CM | POA: Diagnosis not present

## 2016-02-28 DIAGNOSIS — Z96641 Presence of right artificial hip joint: Secondary | ICD-10-CM | POA: Diagnosis present

## 2016-02-28 DIAGNOSIS — K4030 Unilateral inguinal hernia, with obstruction, without gangrene, not specified as recurrent: Secondary | ICD-10-CM | POA: Diagnosis not present

## 2016-02-28 DIAGNOSIS — K5900 Constipation, unspecified: Secondary | ICD-10-CM | POA: Diagnosis not present

## 2016-02-28 DIAGNOSIS — J189 Pneumonia, unspecified organism: Secondary | ICD-10-CM | POA: Diagnosis not present

## 2016-02-28 DIAGNOSIS — E876 Hypokalemia: Secondary | ICD-10-CM | POA: Diagnosis present

## 2016-02-28 DIAGNOSIS — Z9841 Cataract extraction status, right eye: Secondary | ICD-10-CM | POA: Diagnosis not present

## 2016-02-28 DIAGNOSIS — K219 Gastro-esophageal reflux disease without esophagitis: Secondary | ICD-10-CM | POA: Diagnosis not present

## 2016-02-28 DIAGNOSIS — J438 Other emphysema: Secondary | ICD-10-CM | POA: Diagnosis not present

## 2016-02-28 DIAGNOSIS — J90 Pleural effusion, not elsewhere classified: Secondary | ICD-10-CM | POA: Diagnosis not present

## 2016-02-28 DIAGNOSIS — D638 Anemia in other chronic diseases classified elsewhere: Secondary | ICD-10-CM | POA: Diagnosis present

## 2016-02-28 DIAGNOSIS — K56600 Partial intestinal obstruction, unspecified as to cause: Secondary | ICD-10-CM | POA: Diagnosis not present

## 2016-02-28 HISTORY — PX: LAPAROTOMY: SHX154

## 2016-02-28 LAB — BLOOD GAS, ARTERIAL
Acid-Base Excess: 7.5 mmol/L — ABNORMAL HIGH (ref 0.0–2.0)
BICARBONATE: 33 mmol/L — AB (ref 20.0–28.0)
DRAWN BY: 232811
O2 Content: 2 L/min
O2 Saturation: 83.4 %
PATIENT TEMPERATURE: 98.4
PH ART: 7.417 (ref 7.350–7.450)
pCO2 arterial: 52.2 mmHg — ABNORMAL HIGH (ref 32.0–48.0)
pO2, Arterial: 52.4 mmHg — ABNORMAL LOW (ref 83.0–108.0)

## 2016-02-28 LAB — COMPREHENSIVE METABOLIC PANEL
ALK PHOS: 51 U/L (ref 38–126)
ALT: 14 U/L — ABNORMAL LOW (ref 17–63)
ANION GAP: 10 (ref 5–15)
AST: 22 U/L (ref 15–41)
Albumin: 3.3 g/dL — ABNORMAL LOW (ref 3.5–5.0)
BILIRUBIN TOTAL: 1.3 mg/dL — AB (ref 0.3–1.2)
BUN: 22 mg/dL — ABNORMAL HIGH (ref 6–20)
CALCIUM: 9.4 mg/dL (ref 8.9–10.3)
CO2: 29 mmol/L (ref 22–32)
Chloride: 97 mmol/L — ABNORMAL LOW (ref 101–111)
Creatinine, Ser: 0.83 mg/dL (ref 0.61–1.24)
GFR calc Af Amer: >60 mL/min (ref >60)
GLUCOSE: 188 mg/dL — AB (ref 65–99)
POTASSIUM: 3.9 mmol/L (ref 3.5–5.1)
Sodium: 136 mmol/L (ref 135–145)
TOTAL PROTEIN: 6.9 g/dL (ref 6.5–8.1)

## 2016-02-28 LAB — CBC
HEMATOCRIT: 40.7 % (ref 39.0–52.0)
HEMOGLOBIN: 13.5 g/dL (ref 13.0–17.0)
MCH: 29 pg (ref 26.0–34.0)
MCHC: 33.2 g/dL (ref 30.0–36.0)
MCV: 87.5 fL (ref 78.0–100.0)
Platelets: 169 10*3/uL (ref 150–400)
RBC: 4.65 MIL/uL (ref 4.22–5.81)
RDW: 14.7 % (ref 11.5–15.5)
WBC: 19.8 10*3/uL — ABNORMAL HIGH (ref 4.0–10.5)

## 2016-02-28 LAB — URINE MICROSCOPIC-ADD ON

## 2016-02-28 LAB — URINALYSIS, ROUTINE W REFLEX MICROSCOPIC
GLUCOSE, UA: NEGATIVE mg/dL
KETONES UR: NEGATIVE mg/dL
Nitrite: NEGATIVE
PROTEIN: 30 mg/dL — AB
Specific Gravity, Urine: 1.022 (ref 1.005–1.030)
pH: 6 (ref 5.0–8.0)

## 2016-02-28 LAB — I-STAT CG4 LACTIC ACID, ED: LACTIC ACID, VENOUS: 1.5 mmol/L (ref 0.5–1.9)

## 2016-02-28 SURGERY — LAPAROTOMY, EXPLORATORY
Anesthesia: General | Laterality: Right

## 2016-02-28 MED ORDER — AZTREONAM 2 G IJ SOLR: 2.0000 g | Freq: Three times a day (TID) | INTRAMUSCULAR | Status: DC

## 2016-02-28 MED ORDER — DEXTROSE 5 % IV SOLN
2.0000 g | Freq: Three times a day (TID) | INTRAVENOUS | Status: DC
  Administered 2016-02-28 – 2016-03-01 (×5): 2 g via INTRAVENOUS
  Filled 2016-02-28 (×6): qty 2

## 2016-02-28 MED ORDER — LACTATED RINGERS IV SOLN
INTRAVENOUS | Status: DC | PRN
  Administered 2016-02-28: 19:00:00 via INTRAVENOUS

## 2016-02-28 MED ORDER — ETOMIDATE 2 MG/ML IV SOLN
INTRAVENOUS | Status: DC | PRN
  Administered 2016-02-28: 14 mg via INTRAVENOUS

## 2016-02-28 MED ORDER — LIDOCAINE 2% (20 MG/ML) 5 ML SYRINGE
INTRAMUSCULAR | Status: AC
  Filled 2016-02-28: qty 5

## 2016-02-28 MED ORDER — ONDANSETRON HCL 4 MG/2ML IJ SOLN
INTRAMUSCULAR | Status: AC
  Filled 2016-02-28: qty 2

## 2016-02-28 MED ORDER — ONDANSETRON HCL 4 MG/2ML IJ SOLN: 4.0000 mg | Freq: Once | INTRAMUSCULAR | Status: DC | PRN

## 2016-02-28 MED ORDER — HEPARIN SODIUM (PORCINE) 5000 UNIT/ML IJ SOLN
5000.0000 [IU] | Freq: Three times a day (TID) | INTRAMUSCULAR | Status: DC
  Administered 2016-02-28 – 2016-03-09 (×28): 5000 [IU] via SUBCUTANEOUS
  Filled 2016-02-28 (×29): qty 1

## 2016-02-28 MED ORDER — FENTANYL CITRATE (PF) 100 MCG/2ML IJ SOLN
INTRAMUSCULAR | Status: DC | PRN
  Administered 2016-02-28: 100 ug via INTRAVENOUS

## 2016-02-28 MED ORDER — VANCOMYCIN HCL IN DEXTROSE 1-5 GM/200ML-% IV SOLN
1000.0000 mg | Freq: Two times a day (BID) | INTRAVENOUS | Status: DC
  Administered 2016-02-29 – 2016-03-01 (×3): 1000 mg via INTRAVENOUS
  Filled 2016-02-28 (×3): qty 200

## 2016-02-28 MED ORDER — SUGAMMADEX SODIUM 200 MG/2ML IV SOLN
INTRAVENOUS | Status: AC
  Filled 2016-02-28: qty 2

## 2016-02-28 MED ORDER — SODIUM CHLORIDE 0.9 % IV SOLN
INTRAVENOUS | Status: DC | PRN
  Administered 2016-02-28: 18:00:00 via INTRAVENOUS

## 2016-02-28 MED ORDER — 0.9 % SODIUM CHLORIDE (POUR BTL) OPTIME
TOPICAL | Status: DC | PRN
  Administered 2016-02-28: 1000 mL

## 2016-02-28 MED ORDER — BUPIVACAINE LIPOSOME 1.3 % IJ SUSP
20.0000 mL | INTRAMUSCULAR | Status: AC
  Administered 2016-02-28: 20 mL
  Filled 2016-02-28: qty 20

## 2016-02-28 MED ORDER — IOPAMIDOL (ISOVUE-300) INJECTION 61%
INTRAVENOUS | Status: AC
  Filled 2016-02-28: qty 100

## 2016-02-28 MED ORDER — HYDROMORPHONE HCL 1 MG/ML IJ SOLN
0.5000 mg | INTRAMUSCULAR | Status: DC | PRN
  Administered 2016-02-29: 0.5 mg via INTRAVENOUS
  Filled 2016-02-28: qty 1

## 2016-02-28 MED ORDER — VANCOMYCIN HCL IN DEXTROSE 1-5 GM/200ML-% IV SOLN
1000.0000 mg | Freq: Once | INTRAVENOUS | Status: AC
  Administered 2016-02-28: 1000 mg via INTRAVENOUS
  Filled 2016-02-28: qty 200

## 2016-02-28 MED ORDER — HYDROMORPHONE HCL 1 MG/ML IJ SOLN: 0.2500 mg | INTRAMUSCULAR | Status: DC | PRN

## 2016-02-28 MED ORDER — DEXAMETHASONE SODIUM PHOSPHATE 10 MG/ML IJ SOLN
INTRAMUSCULAR | Status: DC | PRN
  Administered 2016-02-28: 10 mg via INTRAVENOUS

## 2016-02-28 MED ORDER — PROPOFOL 10 MG/ML IV BOLUS
INTRAVENOUS | Status: AC
  Filled 2016-02-28: qty 40

## 2016-02-28 MED ORDER — DEXAMETHASONE SODIUM PHOSPHATE 10 MG/ML IJ SOLN
INTRAMUSCULAR | Status: AC
  Filled 2016-02-28: qty 1

## 2016-02-28 MED ORDER — DEXTROSE 5 % IV SOLN
2.0000 g | Freq: Once | INTRAVENOUS | Status: AC
  Administered 2016-02-28: 2 g via INTRAVENOUS
  Filled 2016-02-28: qty 2

## 2016-02-28 MED ORDER — SODIUM CHLORIDE 0.9 % IV BOLUS (SEPSIS)
500.0000 mL | Freq: Once | INTRAVENOUS | Status: AC
Start: 2016-02-28 — End: 2016-02-28
  Administered 2016-02-28: 500 mL via INTRAVENOUS

## 2016-02-28 MED ORDER — IOPAMIDOL (ISOVUE-300) INJECTION 61%
100.0000 mL | Freq: Once | INTRAVENOUS | Status: AC | PRN
  Administered 2016-02-28: 75 mL via INTRAVENOUS

## 2016-02-28 MED ORDER — ROCURONIUM BROMIDE 10 MG/ML (PF) SYRINGE
PREFILLED_SYRINGE | INTRAVENOUS | Status: DC | PRN
  Administered 2016-02-28: 50 mg via INTRAVENOUS

## 2016-02-28 MED ORDER — FENTANYL CITRATE (PF) 250 MCG/5ML IJ SOLN
INTRAMUSCULAR | Status: AC
  Filled 2016-02-28: qty 5

## 2016-02-28 MED ORDER — PHENYLEPHRINE HCL 10 MG/ML IJ SOLN
INTRAVENOUS | Status: DC | PRN
  Administered 2016-02-28: 50 ug/min via INTRAVENOUS

## 2016-02-28 MED ORDER — ONDANSETRON HCL 4 MG/2ML IJ SOLN
4.0000 mg | Freq: Once | INTRAMUSCULAR | Status: AC
  Administered 2016-02-28: 4 mg via INTRAVENOUS
  Filled 2016-02-28: qty 2

## 2016-02-28 MED ORDER — SUCCINYLCHOLINE CHLORIDE 200 MG/10ML IV SOSY
PREFILLED_SYRINGE | INTRAVENOUS | Status: DC | PRN
  Administered 2016-02-28: 140 mg via INTRAVENOUS

## 2016-02-28 MED ORDER — TIOTROPIUM BROMIDE MONOHYDRATE 18 MCG IN CAPS
18.0000 ug | ORAL_CAPSULE | Freq: Every day | RESPIRATORY_TRACT | Status: DC
  Administered 2016-02-29 – 2016-03-01 (×2): 18 ug via RESPIRATORY_TRACT
  Filled 2016-02-28: qty 5

## 2016-02-28 MED ORDER — CIPROFLOXACIN IN D5W 400 MG/200ML IV SOLN
400.0000 mg | INTRAVENOUS | Status: DC
  Filled 2016-02-28: qty 200

## 2016-02-28 MED ORDER — MEPERIDINE HCL 50 MG/ML IJ SOLN: 6.2500 mg | INTRAMUSCULAR | Status: DC | PRN

## 2016-02-28 MED ORDER — SODIUM CHLORIDE 0.9 % IV SOLN
INTRAVENOUS | Status: AC
  Administered 2016-02-28: 22:00:00 via INTRAVENOUS

## 2016-02-28 MED ORDER — SUGAMMADEX SODIUM 500 MG/5ML IV SOLN
INTRAVENOUS | Status: DC | PRN
  Administered 2016-02-28: 300 mg via INTRAVENOUS

## 2016-02-28 MED ORDER — ONDANSETRON HCL 4 MG/2ML IJ SOLN
INTRAMUSCULAR | Status: DC | PRN
  Administered 2016-02-28: 4 mg via INTRAVENOUS

## 2016-02-28 MED ORDER — LEVALBUTEROL HCL 0.63 MG/3ML IN NEBU
0.6300 mg | INHALATION_SOLUTION | RESPIRATORY_TRACT | Status: DC | PRN
  Administered 2016-03-01: 0.63 mg via RESPIRATORY_TRACT
  Filled 2016-02-28: qty 3

## 2016-02-28 SURGICAL SUPPLY — 40 items
APPLICATOR COTTON TIP 6IN STRL (MISCELLANEOUS) ×6 IMPLANT
BLADE EXTENDED COATED 6.5IN (ELECTRODE) IMPLANT
BLADE HEX COATED 2.75 (ELECTRODE) ×3 IMPLANT
BLADE SURG SZ10 CARB STEEL (BLADE) IMPLANT
COVER MAYO STAND STRL (DRAPES) ×3 IMPLANT
COVER SURGICAL LIGHT HANDLE (MISCELLANEOUS) ×3 IMPLANT
DRAIN CHANNEL 19F RND (DRAIN) IMPLANT
DRAPE LAPAROSCOPIC ABDOMINAL (DRAPES) ×3 IMPLANT
DRAPE SHEET LG 3/4 BI-LAMINATE (DRAPES) IMPLANT
DRAPE WARM FLUID 44X44 (DRAPE) ×3 IMPLANT
DRSG PAD ABDOMINAL 8X10 ST (GAUZE/BANDAGES/DRESSINGS) IMPLANT
DRSG TELFA PLUS 4X6 ADH ISLAND (GAUZE/BANDAGES/DRESSINGS) ×2 IMPLANT
ELECT REM PT RETURN 9FT ADLT (ELECTROSURGICAL) ×3
ELECTRODE REM PT RTRN 9FT ADLT (ELECTROSURGICAL) ×1 IMPLANT
EVACUATOR DRAINAGE 10X20 100CC (DRAIN) IMPLANT
EVACUATOR SILICONE 100CC (DRAIN)
GAUZE SPONGE 4X4 12PLY STRL (GAUZE/BANDAGES/DRESSINGS) ×3 IMPLANT
GLOVE BIO SURGEON STRL SZ 6 (GLOVE) ×9 IMPLANT
GLOVE BIOGEL PI IND STRL 6.5 (GLOVE) ×1 IMPLANT
GLOVE BIOGEL PI IND STRL 7.0 (GLOVE) ×1 IMPLANT
GLOVE BIOGEL PI INDICATOR 6.5 (GLOVE) ×2
GLOVE BIOGEL PI INDICATOR 7.0 (GLOVE) ×2
GLOVE INDICATOR 6.5 STRL GRN (GLOVE) ×6 IMPLANT
GOWN STRL REUS W/ TWL XL LVL3 (GOWN DISPOSABLE) ×3 IMPLANT
GOWN STRL REUS W/TWL 2XL LVL3 (GOWN DISPOSABLE) ×3 IMPLANT
GOWN STRL REUS W/TWL XL LVL3 (GOWN DISPOSABLE) ×9
HANDLE SUCTION POOLE (INSTRUMENTS) ×1 IMPLANT
KIT BASIN OR (CUSTOM PROCEDURE TRAY) ×3 IMPLANT
LEGGING LITHOTOMY PAIR STRL (DRAPES) IMPLANT
NS IRRIG 1000ML POUR BTL (IV SOLUTION) ×6 IMPLANT
PACK GENERAL/GYN (CUSTOM PROCEDURE TRAY) ×3 IMPLANT
STAPLER VISISTAT 35W (STAPLE) ×3 IMPLANT
SUCTION POOLE HANDLE (INSTRUMENTS) ×3
SUT PDS AB 1 CTX 36 (SUTURE) IMPLANT
SUT SILK 2 0 SH (SUTURE) ×2 IMPLANT
SUT VIC AB 2-0 SH 18 (SUTURE) ×3 IMPLANT
SUT VIC AB 3-0 SH 18 (SUTURE) ×3 IMPLANT
TOWEL OR 17X26 10 PK STRL BLUE (TOWEL DISPOSABLE) ×6 IMPLANT
TRAY FOLEY W/METER SILVER 16FR (SET/KITS/TRAYS/PACK) ×3 IMPLANT
YANKAUER SUCT BULB TIP NO VENT (SUCTIONS) ×3 IMPLANT

## 2016-02-28 NOTE — H&P (Addendum)
History and Physical        Hospital Admission Note Date: 02/28/2016  Patient name: Allen Beasley Medical record number: [id number] Date of birth: 09-04-1923 Age: 82 y.o. Gender: male  PCP: Geoffery Lyons, MD   Referring physician: Dr Coralyn Pear  Patient coming from: home    Chief Complaint:  Nausea, vomiting and abdominal distention for the last 4 days  HPI: Patient is a 82 year old male with COPD, aortic insufficiency, hypertension, hyperlipidemia, was recently admitted from 10/26-11/7/17 for sepsis and pneumonia, was treated with Levaquin. He returned back to the ER with nausea, vomiting abdominal distention and pain today. Patient reported that his symptoms started on Tuesday, 4 days ago, and has been unable to have a bowel movement. He has tried stool softeners and enemas with no significant relief. He has been having vomiting and has not been able to eat much. He went to his PCP and was recommended mag citrate with no significant relief as well. He noticed worsening abdominal distention, had 2 episodes of vomiting today with abdominal cramping. No fevers or chills, any coughing or chest pain. No prior history of abdominal surgery. Patient also reports no prior colonoscopy ever.  ED work-up/course:  BMET unremarkable, CBC showed the BBC count of 19.8, hemoglobin 13.5, hematocrit 40.7, lactic acid 1.5. Acute abdominal series with chest showed COPD, airspace opacities in the left lower lobe and right upper lobe concerning for multifocal pneumonia. Dilated small bowel loops in the midabdomen concerning for small bowel obstruction.  Review of Systems: Positives marked in 'bold' Constitutional: Denies fever, chills, diaphoresis, + poor appetite and fatigue.  HEENT: Denies photophobia, eye pain, redness, hearing loss, ear pain, congestion, sore throat, rhinorrhea, sneezing, mouth  sores, trouble swallowing, neck pain, neck stiffness and tinnitus.   Respiratory: Denies SOB, DOE, cough, chest tightness,  and wheezing.   Cardiovascular: Denies chest pain, palpitations and leg swelling.  Gastrointestinal:  Please see HPI  Genitourinary: Denies dysuria, urgency, frequency, hematuria, flank pain and difficulty urinating.  Musculoskeletal: Denies myalgias, back pain, joint swelling, arthralgias and gait problem.  Skin: Denies pallor, rash and wound.  Neurological: Denies dizziness, seizures, syncope, weakness, light-headedness, numbness and headaches.  Hematological: Denies adenopathy. Easy bruising, personal or family bleeding history  Psychiatric/Behavioral: Denies suicidal ideation, mood changes, confusion, nervousness, sleep disturbance and agitation  Past Medical History: Past Medical History:  Diagnosis Date  . COPD (chronic obstructive pulmonary disease) ([place])   . Erectile dysfunction   . History of aortic insufficiency   . History of chest pain   . Hyperlipidemia   . Hypertension     Past Surgical History:  Procedure Laterality Date  . CATARACT EXTRACTION, BILATERAL  2001  . JOINT REPLACEMENT    . LITHOTRIPSY  2001  . TONSILECTOMY, ADENOIDECTOMY, BILATERAL MYRINGOTOMY AND TUBES  1932  . TOTAL HIP ARTHROPLASTY  07   right    Medications: Prior to Admission medications   Medication Sig Start Date End Date Taking? Authorizing Provider  furosemide (LASIX) 20 MG tablet Take 1 tablet (20 mg total) by mouth daily. 02/17/16  Yes Oswald Hillock, MD  guaiFENesin (MUCINEX) 600 MG 12 hr tablet Take 1 tablet (600 mg total) by  mouth 2 (two) times daily. 02/17/16  Yes Oswald Hillock, MD  lactobacillus acidophilus & bulgar (LACTINEX) chewable tablet Chew 2 tablets by mouth 3 (three) times daily with meals. 02/17/16  Yes Oswald Hillock, MD  levalbuterol (XOPENEX) 0.63 MG/3ML nebulizer solution Take 3 mLs (0.63 mg total) by nebulization every 4 (four) hours as needed for wheezing  or shortness of breath. 02/17/16  Yes Oswald Hillock, MD  magnesium citrate SOLN Take 1 Bottle by mouth once.   Yes Historical Provider, MD  pantoprazole (PROTONIX) 40 MG tablet Take 1 tablet (40 mg total) by mouth daily at 6 (six) AM. 02/18/16  Yes Oswald Hillock, MD  polyethylene glycol (MIRALAX / GLYCOLAX) packet Take 17 g by mouth daily. 02/18/16  Yes Oswald Hillock, MD  simvastatin (ZOCOR) 80 MG tablet Take 80 mg by mouth daily.   Yes Historical Provider, MD  sodium phosphate Pediatric (FLEET) 3.5-9.5 GM/59ML enema Place 1 enema rectally once.   Yes Historical Provider, MD  SPIRIVA HANDIHALER 18 MCG inhalation capsule PLACE 1 CAPSULE INTO INHALER AND INHALE THE CONTENTS OF 1 CAPSULE ONCE DAILY 04/02/14  Yes Deneise Lever, MD  sucralfate (CARAFATE) 1 GM/10ML suspension Take 10 mLs (1 g total) by mouth 2 (two) times daily. Take at breakfast at bedtime.  TAKE AT LEAST 2 HOURS APART FROM LEVOFLOXACIN 02/17/16 02/27/17 Yes Oswald Hillock, MD  temazepam (RESTORIL) 15 MG capsule Take 15 mg by mouth at bedtime as needed. 02/19/16  Yes Historical Provider, MD  terazosin (HYTRIN) 2 MG capsule Take 2 mg by mouth at bedtime.     Yes Historical Provider, MD  levofloxacin (LEVAQUIN) 750 MG tablet Take 1 tablet (750 mg total) by mouth daily. Patient not taking: Reported on 02/28/2016 02/17/16   Oswald Hillock, MD  senna-docusate (SENOKOT-S) 8.6-50 MG tablet Take 2 tablets by mouth 2 (two) times daily. Patient not taking: Reported on 02/28/2016 02/17/16   Oswald Hillock, MD    Allergies:   Allergies  Allergen Reactions  . Penicillins Hives and Swelling  . Clindamycin/Lincomycin Rash    Social History:  reports that he quit smoking about 14 years ago. His smoking use included Cigarettes. He has a 59.00 pack-year smoking history. He has never used smokeless tobacco. He reports that he drinks alcohol. His drug history is not on file.  Family History: Family History  Problem Relation Age of Onset  . Cancer- colon  Father   . Heart disease Father   . Heart attack Father     Physical Exam: Blood pressure (!) 125/47, pulse 87, temperature 97.8 F (36.6 C), temperature source Oral, resp. rate 17, height 6\' 2"  (1.88 m), weight 83.9 kg (185 lb), SpO2 92 %. General: Alert, awake, oriented x3, in no acute distress. HEENT: normocephalic, atraumatic, anicteric sclera, pink conjunctiva, pupils equal and reactive to light and accomodation, oropharynx clear Neck: supple, no masses or lymphadenopathy, no goiter, no bruits  Heart: Regular rate and rhythm, without murmurs, rubs or gallops. Lungs:Decreased breath sounds at the bases  Abdomen: Soft, mild diffuse tenderness, + distended,  hypoactive bowel sounds, no masses. Extremities: No clubbing, cyanosis or edema with positive pedal pulses. Neuro: Grossly intact, no focal neurological deficits, strength 5/5 upper and lower extremities bilaterally Psych: alert and oriented x 3, normal mood and affect Skin: no rashes or lesions, warm and dry   LABS on Admission:  Basic Metabolic Panel:  Recent Labs Lab 02/28/16 1058  NA 136  K 3.9  CL  97*  CO2 29  GLUCOSE 188*  BUN 22*  CREATININE 0.83  CALCIUM 9.4   Liver Function Tests:  Recent Labs Lab 02/28/16 1058  AST 22  ALT 14*  ALKPHOS 51  BILITOT 1.3*  PROT 6.9  ALBUMIN 3.3*   No results for input(s): LIPASE, AMYLASE in the last 168 hours. No results for input(s): AMMONIA in the last 168 hours. CBC:  Recent Labs Lab 02/28/16 1058  WBC 19.8*  HGB 13.5  HCT 40.7  MCV 87.5  PLT 169   Cardiac Enzymes: No results for input(s): CKTOTAL, CKMB, CKMBINDEX, TROPONINI in the last 168 hours. BNP: Invalid input(s): POCBNP CBG: No results for input(s): GLUCAP in the last 168 hours.  Radiological Exams on Admission:  No results found.  *I have personally reviewed the images above*  EKG: Independently reviewed. 90 sinus tachycardia   Assessment/Plan Principal Problem:   SBO (small bowel  obstruction) - No prior colonoscopy, rule out tumor, no prior abdominal surgeries - admit to SDU, keep NPO, IV fluids, NGT to intermittent wall suction, dulcolax supp - EDP to call surgery - CT abd and pelvis for further workup  Active Problems:   COPD with emphysema (HCC) - Currently stable, no wheezing, will continue Xopenex and Spiriva    HTN (hypertension) - Currently stable    Acute on chronic respiratory failure with hypoxia [facility]): Multifocal pneumonia, likely due to aspiration from multiple episodes of vomiting - Placed on pneumonia protocol, continue vancomycin and aztrenam - Follow urine strep antigen, Legionella antigen, sputum protocol, blood cultures    Esophageal reflux - Placed on IV PPI  DVT prophylaxis: heparin sq  CODE STATUS: full code, discussed with the patient  Consults called: Surgery  Family Communication: Admission, patients condition and plan of care including tests being ordered have been discussed with the patient and niece  who indicates understanding and agree with the plan and Code Status  Admission status:  inpatient stepdown   Disposition plan: Further plan will depend as patient's clinical course evolves and further radiologic and laboratory data become available. Likely home  when ready At the time of admission, it appears that the appropriate admission status for this patient is INPATIENT . This is judged to be reasonable and necessary in order to provide the required intensity of service to ensure the patient's safety given the presenting symptoms, physical exam findings, and initial radiographic and laboratory data in the context of their chronic comorbidities.   Addendum 4: 20PM  CT abd  Bowel obstruction at the level of a right inguinal hernia. Suspect a degree of incarceration in this hernia. Transition zone noted at the level of this hernia near the jejunoileal junction.   Discussed with Dr. Coralyn Pear, he will call surgery again, patient  will likely need surgery for incarcerated hernia. I have also discussed with Dr. Barry Dienes, she will be evaluating him.    Time Spent on Admission: 16mins   RAI,RIPUDEEP M.D. [facility] 02/28/2016, 1:48 PM Pager: [phone]  If 7PM-7AM, please contact night-coverage [email or web address] Password [email or web address]

## 2016-02-28 NOTE — Consult Note (Signed)
Reason for Consult:  Small bowel obstruction Referring Physician:  [name] is an 82 y.o. male.  HPI:  Pt is a 82 yo M who I am asked to see for SBO.  He came in with 3-4 days of nausea and vomiting, constipation.  He had sought outpatient medical care, and he was placed on a regimen for constipation. He continued to throw up and so came to the emergency department. He states that he has been throwing up since Wednesday which is 3-4 days prior to evaluation. He developed right groin pain at the time. He has known he has had a hernia for at least several weeks, but he does not recall if he had it for multiple years. He had NGT placed in ED and has had significant amount of bilious output.    He does have a history of COPD. He uses oxygen at home. He gets around okay during the daytime without oxygen without dyspnea.  He was admitted to the hospital several weeks ago for acute on chronic respiratory failure. He was discharged on Levaquin. It was that admission that he was informed he had a right inguinal hernia.     Past Medical History:  Diagnosis Date  . COPD (chronic obstructive pulmonary disease) ([place])   . Erectile dysfunction   . History of aortic insufficiency   . History of chest pain   . Hyperlipidemia   . Hypertension     Past Surgical History:  Procedure Laterality Date  . CATARACT EXTRACTION, BILATERAL  2001  . JOINT REPLACEMENT    . LITHOTRIPSY  2001  . TONSILECTOMY, ADENOIDECTOMY, BILATERAL MYRINGOTOMY AND TUBES  1932  . TOTAL HIP ARTHROPLASTY  07   right    Family History  Problem Relation Age of Onset  . Cancer Father   . Heart disease Father   . Heart attack Father     Social History:  reports that he quit smoking about 14 years ago. His smoking use included Cigarettes. He has a 59.00 pack-year smoking history. He has never used smokeless tobacco. He reports that he drinks alcohol. His drug history is not on file.  Allergies:  Allergies  Allergen  Reactions  . Penicillins Hives and Swelling  . Clindamycin/Lincomycin Rash    Medications: Prior to Admission:  Outpatient Medications   furosemide (LASIX) 20 MG tablet    guaiFENesin (MUCINEX) 600 MG 12 hr tablet    lactobacillus acidophilus & bulgar (LACTINEX) chewable tablet    levalbuterol (XOPENEX) 0.63 MG/3ML nebulizer solution    magnesium citrate SOLN    pantoprazole (PROTONIX) 40 MG tablet    polyethylene glycol (MIRALAX / GLYCOLAX) packet    simvastatin (ZOCOR) 80 MG tablet    sodium phosphate Pediatric (FLEET) 3.5-9.5 GM/59ML enema    SPIRIVA HANDIHALER 18 MCG inhalation capsule    sucralfate (CARAFATE) 1 GM/10ML suspension    temazepam (RESTORIL) 15 MG capsule    terazosin (HYTRIN) 2 MG capsule    levofloxacin (LEVAQUIN) 750 MG tablet    senna-docusate (SENOKOT-S) 8.6-50 MG tablet     Results for orders placed or performed during the hospital encounter of 02/28/16 (from the past 48 hour(s))  CBC     Status: Abnormal   Collection Time: 02/28/16 10:58 AM  Result Value Ref Range   WBC 19.8 (H) 4.0 - 10.5 K/uL   RBC 4.65 4.22 - 5.81 MIL/uL   Hemoglobin 13.5 13.0 - 17.0 g/dL   HCT 40.7 39.0 - 52.0 %  MCV 87.5 78.0 - 100.0 fL   MCH 29.0 26.0 - 34.0 pg   MCHC 33.2 30.0 - 36.0 g/dL   RDW 14.7 11.5 - 15.5 %   Platelets 169 150 - 400 K/uL  Comprehensive metabolic panel     Status: Abnormal   Collection Time: 02/28/16 10:58 AM  Result Value Ref Range   Sodium 136 135 - 145 mmol/L   Potassium 3.9 3.5 - 5.1 mmol/L   Chloride 97 (L) 101 - 111 mmol/L   CO2 29 22 - 32 mmol/L   Glucose, Bld 188 (H) 65 - 99 mg/dL   BUN 22 (H) 6 - 20 mg/dL   Creatinine, Ser 0.83 0.61 - 1.24 mg/dL   Calcium 9.4 8.9 - 10.3 mg/dL   Total Protein 6.9 6.5 - 8.1 g/dL   Albumin 3.3 (L) 3.5 - 5.0 g/dL   AST 22 15 - 41 U/L   ALT 14 (L) 17 - 63 U/L   Alkaline Phosphatase 51 38 - 126 U/L   Total Bilirubin 1.3 (H) 0.3 - 1.2 mg/dL   GFR calc non Af Amer >60 >60 mL/min   GFR calc Af Amer >60 >60  mL/min    Comment: (NOTE) The eGFR has been calculated using the CKD EPI equation. This calculation has not been validated in all clinical situations. eGFR's persistently <60 mL/min signify possible Chronic Kidney Disease.    Anion gap 10 5 - 15  I-Stat CG4 Lactic Acid, ED     Status: None   Collection Time: 02/28/16 12:50 PM  Result Value Ref Range   Lactic Acid, Venous 1.50 0.5 - 1.9 mmol/L  Urinalysis, Routine w reflex microscopic (not at [facility])     Status: Abnormal   Collection Time: 02/28/16 12:55 PM  Result Value Ref Range   Color, Urine AMBER (A) YELLOW    Comment: BIOCHEMICALS MAY BE AFFECTED BY COLOR   APPearance CLOUDY (A) CLEAR   Specific Gravity, Urine 1.022 1.005 - 1.030   pH 6.0 5.0 - 8.0   Glucose, UA NEGATIVE NEGATIVE mg/dL   Hgb urine dipstick LARGE (A) NEGATIVE   Bilirubin Urine SMALL (A) NEGATIVE   Ketones, ur NEGATIVE NEGATIVE mg/dL   Protein, ur 30 (A) NEGATIVE mg/dL   Nitrite NEGATIVE NEGATIVE   Leukocytes, UA SMALL (A) NEGATIVE  Urine microscopic-add on     Status: Abnormal   Collection Time: 02/28/16 12:55 PM  Result Value Ref Range   Squamous Epithelial / LPF 0-5 (A) NONE SEEN   WBC, UA 0-5 0 - 5 WBC/hpf   RBC / HPF TOO NUMEROUS TO COUNT 0 - 5 RBC/hpf   Bacteria, UA RARE (A) NONE SEEN    Ct Abdomen Pelvis W Contrast  Result Date: 02/28/2016 CLINICAL DATA:  Abdominal pain with nausea and vomiting EXAM: CT ABDOMEN AND PELVIS WITH CONTRAST TECHNIQUE: Multidetector CT imaging of the abdomen and pelvis was performed using the standard protocol following bolus administration of intravenous contrast. CONTRAST:  66m ISOVUE-300 IOPAMIDOL (ISOVUE-300) INJECTION 61% COMPARISON:  CT abdomen and pelvis February 06, 2016 ; abdominal radiographic series February 28, 2016 FINDINGS: Lower chest: There is airspace consolidation throughout much of the left lower lobe and inferior lingula with moderate pleural effusion on the left. There is patchy consolidation in the  medial and posterior segments of the right lower lobe. There is atelectatic change in the right middle lobe. There is a focal stable nodular opacity in the lateral segment of the right lower lobe measuring 8 x 6 mm.  There are multiple foci of coronary artery calcification. There is a hiatal hernia. Hepatobiliary: No focal liver lesions are evident. Gallbladder wall is not appreciably thickened. There is no biliary duct dilatation. Pancreas: There is no pancreatic mass or inflammatory focus. Spleen: No splenic lesions are evident. Adrenals/Urinary Tract: The right adrenal appears normal. There is an apparent adenoma in the left adrenal measuring 1.2 x 1.2 cm. Kidneys bilaterally show no mass or hydronephrosis on either side. There is moderate prominence of renal sinus fat bilaterally. There is no renal or ureteral calculus bilaterally. Urinary bladder is midline with overall wall thickness within normal limits. There is a diverticulum arising from the rightward aspect of the urinary bladder measuring 3.4 x 2.2 cm. Note also that there is mild irregular thickening along the posterior aspect of the urinary bladder wall. Stomach/Bowel: There is proximal small bowel obstruction which extends to the level of a right inguinal hernia. Bowel extends into this hernia. A transition zone is noted at the level of the right inguinal hernia indicative of a degree of bowel obstruction. This appearance suggests that there may be a degree of incarceration in this right inguinal hernia. There is no appreciable bowel wall or mesenteric thickening. Nasogastric tube tip is in the stomach. Stomach is filled with fluid and air. No free air or portal venous air is evident. No bowel pneumatosis evident. Vascular/Lymphatic: There is extensive atherosclerotic calcification throughout the aorta and major pelvic arterial vessels extending to the proximal thigh regions. There is dilatation of the distal abdominal aorta with a maximum transverse  diameter of 3.0 x 3.0 cm. There is no periaortic fluid. There is calcification at the origins of the major mesenteric vessels. There is no appreciable adenopathy in the abdomen or pelvis. Reproductive: Prostate is prominent, containing multiple calcifications. There is no pelvic mass outside of the prostate and potential urinary bladder. No pelvic fluid collection evident. Other: Appendix appears unremarkable. No abscess or ascites evident in the abdomen or pelvis. In addition to the apparent incarcerated right inguinal hernia. There is fat in the left inguinal ring. Musculoskeletal: There is extensive lumbar spine osteoarthritic change. There is spinal stenosis at L2-3, L3-4, L4-5, and L5-S1 due to extensive bony hypertrophy and diffuse disc protrusion. There are no blastic or lytic bone lesions. There is no intramuscular or abdominal wall lesion. Note that the patient has had a previous total hip replacement right. There is moderate osteoarthritic change in the left hip joint. IMPRESSION: Bowel obstruction at the level of a right inguinal hernia. Suspect a degree of incarceration in this hernia. Transition zone noted at the level of this hernia near the jejunoileal junction. No free air. Extensive airspace consolidation in the inferior lingula and left lower lobe with fairly small left pleural effusion. There is also patchy infiltrate in the posterior and medial segments of the right lower lobe. 7 x 5 mm nodular opacity right lower lobe. Non-contrast chest CT at 6-12 months is recommended. If the nodule is stable at time of repeat CT, then future CT at 18-24 months (from today's scan) is considered optional for low-risk patients, but is recommended for high-risk patients. This recommendation follows the consensus statement: Guidelines for Management of Incidental Pulmonary Nodules Detected on CT Images: From the Fleischner Society 2017; Radiology 2017; 284:228-243. Mild irregularity along the urinary bladder wall  posteriorly raises concern for potential inflammation or early neoplasm. This finding may well warrant direct visualization/cystoscopy to further evaluate. There is a rightward located urinary bladder diverticulum. No renal or  ureteral calculus. No hydronephrosis. Prominent prostate with multiple prostatic calculi. Advise correlation with PSA. Small left adrenal adenoma. Extensive atherosclerotic calcification in the aorta and essentially all abdominal and pelvic arterial vessels as well as coronary artery calcification. Mild dilatation of the distal aorta. Recommend followup by ultrasound in 3 years. This recommendation follows ACR consensus guidelines: White Paper of the ACR Incidental Findings Committee II on Vascular Findings. J Am Coll Radiol 2013; 10:789-794. Multilevel spinal stenosis, multifactorial. Small hiatal hernia. Stomach filled with fluid and air. Nasogastric tube tip in stomach. Critical Value/emergent results were called by telephone at the time of interpretation on 02/28/2016 at 3:05 pm to Dr. Lajean Saver , who verbally acknowledged these results. Electronically Signed   By: Lowella Grip III M.D.   On: 02/28/2016 15:05   Dg Abd Acute W/chest  Result Date: 02/28/2016 CLINICAL DATA:  Constipation.  COPD. EXAM: DG ABDOMEN ACUTE W/ 1V CHEST COMPARISON:  02/11/2016 FINDINGS: New patchy airspace opacity in the right upper lobe. Continued opacity at the left lung base. Underlying COPD. Heart is normal size. No effusions. Dilated small bowel loops in the mid abdomen with air-fluid levels concerning for small bowel obstruction. There is a paucity of gas in the colon. No free air organomegaly. IMPRESSION: COPD. Airspace opacities in the left lower lobe and right upper lobe concerning for multifocal pneumonia. Dilated small bowel loops in the mid abdomen concerning for small bowel obstruction. Electronically Signed   By: Rolm Baptise M.D.   On: 02/28/2016 11:48    Review of Systems  HENT:  Negative.   Eyes: Negative.   Respiratory: Positive for shortness of breath.   Cardiovascular: Negative.   Gastrointestinal: Positive for constipation, nausea and vomiting.  Genitourinary:       Right groin pain Dark urine.   Decreased urine output  Musculoskeletal: Negative.   Skin: Negative.   Neurological: Positive for weakness.  Endo/Heme/Allergies: Negative.   Psychiatric/Behavioral: Negative.    Blood pressure 144/60, pulse (!) 56, temperature 97.8 F (36.6 C), temperature source Oral, resp. rate 20, height 6' 2"  (1.88 m), weight 83.9 kg (185 lb), SpO2 (!) 89 %. Physical Exam  Constitutional: He is oriented to person, place, and time. He appears well-developed and well-nourished. He appears distressed.  HENT:  Head: Normocephalic and atraumatic.  Eyes: Conjunctivae and EOM are normal. No scleral icterus.  Neck: Neck supple. No tracheal deviation present.  Cardiovascular: Regular rhythm and intact distal pulses.   Bradycardic  Respiratory: He is in respiratory distress.  GI: Soft. He exhibits distension. There is no tenderness (abdomen is not tender). There is no rebound and no guarding. A hernia is present. Hernia confirmed positive in the right inguinal area (incarcerated right inguinal hernia.  does not extend into scrotum, but goes to level of base of penis.  ).  Musculoskeletal: He exhibits no edema or tenderness.  Neurological: He is alert and oriented to person, place, and time. Coordination normal.  Skin: Skin is warm and dry. No rash noted. He is not diaphoretic. No erythema. No pallor.  Psychiatric: He has a normal mood and affect. His behavior is normal. Judgment and thought content normal.    Assessment/Plan: Incarcerated right inguinal hernia with obstruction. Possible strangulated bowel given length of time of obstruction (4 days) and leukocytosis. Pneumonia vs aspiration COPD with oxygen at night.   Aortic insufficiency.  Plan repair of right hernia.  May  need exploratory laparotomy with bowel resection given length of time the hernia has been incarcerated.  I do not plan to place mesh given this risk of bowel compromise.    Discussed with patient and family.  He is high risk for surgery, but has a good functional status other than these last few days.  Without surgery, he is at high risk for bowel perforation and severe septic shock, which I think would be more difficult to recover from than controlled anesthesia. His pulmonary status is the most concerning aspect of his medical history.  With poor oxygenation pre op (82-90% on 3 L during exam/discussion), he is at risk for prolonged time on the ventilator, worsening pneumonia, possible death.   I reviewed risk of wound infection, bowel leak if bowel resection is needed, unrecognized bowel injury/compromise and delayed leak, recurrent hernia, blood clot.    He and his family agree to proceed.    Latanja Lehenbauer 02/28/2016, 5:31 PM

## 2016-02-28 NOTE — Progress Notes (Signed)
Pharmacy Antibiotic Note  Allen Beasley is a 82 y.o. male admitted on 02/28/2016 with pneumonia.  PMH significant for a recent admission (10/26-11/7/17) for sepsis/PNA.  He was treated with course of Clindamycin/Flagyl that was changed to Levaquin when he developed rash on Clindamycin.  He presents today with abdominal pain, nausea, vomiting.  CXR+ PNA.  Plan broad antibiotic coverage given recent history..  Pharmacy has been consulted for Aztreonam & Vancomycin dosing. PCN allergy noted.  Patient has no history of receiving cephalosporins.    02/28/2016:   Afebrile  Leukocytosis (WBC 19.8)  Renal fxn at baseline.  CrCl ~ 12ml/min.   Plan: Vancomycin 1g IV every 12 hours.  Goal trough 15-20 mcg/mL. Aztreonam 2gm IV q8h  Check Vancomycin trough at steady state Monitor renal function and cx data   Height: 6\' 2"  (188 cm) Weight: 185 lb (83.9 kg) IBW/kg (Calculated) : 82.2  Temp (24hrs), Avg:97.8 F (36.6 C), Min:97.8 F (36.6 C), Max:97.8 F (36.6 C)   Recent Labs Lab 02/28/16 1058 02/28/16 1250  WBC 19.8*  --   CREATININE 0.83  --   LATICACIDVEN  --  1.50    Estimated Creatinine Clearance: 68.8 mL/min (by C-G formula based on SCr of 0.83 mg/dL).    Allergies  Allergen Reactions  . Penicillins Hives and Swelling  . Clindamycin/Lincomycin Rash    Antimicrobials this admission: 11/18 Aztreonam >>  11/18 Vanc >>   Dose adjustments this admission:  Microbiology results: 11/18 BCx: sent  Thank you for allowing pharmacy to be a part of this patient's care.  Netta Cedars, PharmD, BCPS Pager: [phone] 02/28/2016 1:25 PM

## 2016-02-28 NOTE — Anesthesia Preprocedure Evaluation (Addendum)
Anesthesia Evaluation  Patient identified by MRN, date of birth, ID band Patient awake    Reviewed: Allergy & Precautions, NPO status , Patient's Chart, lab work & pertinent test results  Airway Mallampati: I  TM Distance: >3 FB Neck ROM: Full    Dental   Pulmonary COPD,  oxygen dependent, former smoker,    Pulmonary exam normal        Cardiovascular hypertension, Pt. on medications Normal cardiovascular exam  ECHO 10/17 Study Conclusions  - Left ventricle: The cavity size was normal. Wall thickness was   increased in a pattern of severe LVH. Systolic function was   vigorous. The estimated ejection fraction was in the range of 65%   to 70%. Wall motion was normal; there were no regional wall   motion abnormalities. Doppler parameters are consistent with   abnormal left ventricular relaxation (grade 1 diastolic   dysfunction).   Neuro/Psych    GI/Hepatic GERD  Medicated and Controlled,  Endo/Other    Renal/GU      Musculoskeletal   Abdominal   Peds  Hematology   Anesthesia Other Findings   Reproductive/Obstetrics                            Anesthesia Physical Anesthesia Plan  ASA: III and emergent  Anesthesia Plan: General   Post-op Pain Management:    Induction: Intravenous  Airway Management Planned: Oral ETT  Additional Equipment: Arterial line  Intra-op Plan:   Post-operative Plan: Possible Post-op intubation/ventilation  Informed Consent: I have reviewed the patients History and Physical, chart, labs and discussed the procedure including the risks, benefits and alternatives for the proposed anesthesia with the patient or authorized representative who has indicated his/her understanding and acceptance.     Plan Discussed with: CRNA and Surgeon  Anesthesia Plan Comments:        Anesthesia Quick Evaluation

## 2016-02-28 NOTE — Anesthesia Procedure Notes (Signed)
Procedure Name: Intubation Date/Time: 02/28/2016 6:59 PM Performed by: Lind Covert Pre-anesthesia Checklist: Patient identified, Emergency Drugs available, Suction available, Patient being monitored and Timeout performed Patient Re-evaluated:Patient Re-evaluated prior to inductionOxygen Delivery Method: Circle system utilized Preoxygenation: Pre-oxygenation with 100% oxygen Intubation Type: IV induction, Rapid sequence and Cricoid Pressure applied Laryngoscope Size: Mac Grade View: Grade I Tube type: Oral Tube size: 8.0 mm Number of attempts: 1 Airway Equipment and Method: Stylet Placement Confirmation: ETT inserted through vocal cords under direct vision,  positive ETCO2 and breath sounds checked- equal and bilateral Secured at: 22 cm Tube secured with: Tape Dental Injury: Teeth and Oropharynx as per pre-operative assessment

## 2016-02-28 NOTE — ED Notes (Signed)
ED Provider at bedside. 

## 2016-02-28 NOTE — ED Notes (Addendum)
Patient states he is not able to urinate. RN notified

## 2016-02-28 NOTE — Transfer of Care (Signed)
Immediate Anesthesia Transfer of Care Note  Patient: Allen Beasley  Procedure(s) Performed: Procedure(s): RIGHT INCARCERATED INGUINAL HERNIA, (Right)  Patient Location: PACU  Anesthesia Type:General  Level of Consciousness: sedated  Airway & Oxygen Therapy: face mask  O2  Post-op Assessment: Report given to RN and Post -op Vital signs reviewed and stable  Post vital signs: Reviewed and stable  Last Vitals:  Vitals:   02/28/16 1724 02/28/16 1754  BP: 144/60   Pulse: (!) 56   Resp: 20   Temp:  36.9 C    Last Pain:  Vitals:   02/28/16 1754  TempSrc: Oral  PainSc: 0-No pain         Complications: No apparent anesthesia complications

## 2016-02-28 NOTE — ED Notes (Signed)
Patient transported to CT 

## 2016-02-28 NOTE — Anesthesia Postprocedure Evaluation (Signed)
Anesthesia Post Note  Patient: Allen Beasley  Procedure(s) Performed: Procedure(s) (LRB): RIGHT INCARCERATED INGUINAL HERNIA, (Right)  Patient location during evaluation: PACU Anesthesia Type: General Level of consciousness: awake and alert Pain management: pain level controlled Vital Signs Assessment: post-procedure vital signs reviewed and stable Respiratory status: spontaneous breathing, nonlabored ventilation, respiratory function stable and patient connected to nasal cannula oxygen Cardiovascular status: blood pressure returned to baseline and stable Postop Assessment: no signs of nausea or vomiting Anesthetic complications: no Comments: ABG noted - Pt heading to SICU    Last Vitals:  Vitals:   02/28/16 2010 02/28/16 2055  BP: (!) 136/53 (!) 108/51  Pulse: 96 93  Resp:  16  Temp: 36.9 C 36.9 C    Last Pain:  Vitals:   02/28/16 2045  TempSrc:   PainSc: [place]

## 2016-02-28 NOTE — ED Triage Notes (Signed)
Pt presents with c/o constipation and being unable to have a bowel movement for 4 days. Pt reports he has tried a stool softener and enema but has had no relief. Pt also reports vomiting as well. Has not been able to eat a significant amount.

## 2016-02-28 NOTE — ED Notes (Signed)
Admitting provider at bedside.

## 2016-02-28 NOTE — ED Notes (Signed)
Patient transported to X-ray 

## 2016-02-28 NOTE — ED Notes (Signed)
Pt given urinal and reminded of the need for urine

## 2016-02-28 NOTE — ED Provider Notes (Addendum)
[facility] DEPT Provider Note   CSN: [id number] Arrival date & time: 02/28/16  1014     History   Chief Complaint Chief Complaint  Patient presents with  . Constipation    HPI Allen Beasley is a 82 y.o. male.  Patient c/o intermittent nausea and vomiting for the past 2 days. Emesis color of ingested food/liquid, no bloody or bilious emesis. Has had some problems w constipation in past week, had enema at home, and is on stool softener - had small bm yesterday. Has had intermittent mid/diffuse abd crampy pain, especially after eating or drinking. Recent inpatient tx for pna, completed abx 4 days ago. Denies cough or fever. States feels breathing is at baseline. No chest pain or discomfort.  Family member wants patient checked for possible 'bowel blockage'. No hx prior abd surgery. Is making normal amounts of urine.    The history is provided by the patient.  Constipation   Pertinent negatives include no dysuria.    Past Medical History:  Diagnosis Date  . COPD (chronic obstructive pulmonary disease) ([place])   . Erectile dysfunction   . History of aortic insufficiency   . History of chest pain   . Hyperlipidemia   . Hypertension     Patient Active Problem List   Diagnosis Date Noted  . Necrotizing pneumonia ([place])   . Hypoxia   . Esophageal reflux   . Goals of care, counseling/discussion   . Palliative care encounter   . Acute on chronic respiratory failure with hypoxia ([place])   . Sepsis ([place]) 02/05/2016  . Acute respiratory failure with hypoxia ([place]) 02/05/2016  . BPH (benign prostatic hyperplasia) 02/05/2016  . Hyperglycemia 02/05/2016  . Thyroid nodule 09/15/2013  . CAP (community acquired pneumonia) 08/19/2012  . HTN (hypertension) 08/05/2010  . Erectile dysfunction   . History of chest pain   . History of aortic insufficiency   . HYPERLIPIDEMIA 01/09/2010  . COPD with emphysema ([place]) 06/19/2007    Past Surgical History:  Procedure Laterality Date  .  CATARACT EXTRACTION, BILATERAL  2001  . JOINT REPLACEMENT    . LITHOTRIPSY  2001  . TONSILECTOMY, ADENOIDECTOMY, BILATERAL MYRINGOTOMY AND TUBES  1932  . TOTAL HIP ARTHROPLASTY  07   right       Home Medications    Prior to Admission medications   Medication Sig Start Date End Date Taking? Authorizing Provider  furosemide (LASIX) 20 MG tablet Take 1 tablet (20 mg total) by mouth daily. 02/17/16   Oswald Hillock, MD  guaiFENesin (MUCINEX) 600 MG 12 hr tablet Take 1 tablet (600 mg total) by mouth 2 (two) times daily. 02/17/16   Oswald Hillock, MD  lactobacillus acidophilus & bulgar (LACTINEX) chewable tablet Chew 2 tablets by mouth 3 (three) times daily with meals. 02/17/16   Oswald Hillock, MD  levalbuterol (XOPENEX) 0.63 MG/3ML nebulizer solution Take 3 mLs (0.63 mg total) by nebulization every 4 (four) hours as needed for wheezing or shortness of breath. 02/17/16   Oswald Hillock, MD  levofloxacin (LEVAQUIN) 750 MG tablet Take 1 tablet (750 mg total) by mouth daily. 02/17/16   Oswald Hillock, MD  pantoprazole (PROTONIX) 40 MG tablet Take 1 tablet (40 mg total) by mouth daily at 6 (six) AM. 02/18/16   Oswald Hillock, MD  polyethylene glycol (MIRALAX / GLYCOLAX) packet Take 17 g by mouth daily. Patient not taking: Reported on 02/23/2016 02/18/16   Oswald Hillock, MD  senna-docusate (SENOKOT-S) 8.6-50 MG tablet Take  2 tablets by mouth 2 (two) times daily. 02/17/16   Oswald Hillock, MD  simvastatin (ZOCOR) 80 MG tablet Take 80 mg by mouth daily.    Historical Provider, MD  SPIRIVA HANDIHALER 18 MCG inhalation capsule PLACE 1 CAPSULE INTO INHALER AND INHALE THE CONTENTS OF 1 CAPSULE ONCE DAILY 04/02/14   Deneise Lever, MD  sucralfate (CARAFATE) 1 GM/10ML suspension Take 10 mLs (1 g total) by mouth 2 (two) times daily. Take at breakfast at bedtime.  TAKE AT LEAST 2 HOURS APART FROM LEVOFLOXACIN 02/17/16 02/27/16  Oswald Hillock, MD  terazosin (HYTRIN) 2 MG capsule Take 2 mg by mouth at bedtime.      Historical  Provider, MD    Family History Family History  Problem Relation Age of Onset  . Cancer Father   . Heart disease Father   . Heart attack Father     Social History Social History  Substance Use Topics  . Smoking status: Former Smoker    Packs/day: 1.00    Years: 59.00    Types: Cigarettes    Quit date: 08/04/2001  . Smokeless tobacco: Never Used  . Alcohol use Yes     Comment: patient admits to alcohol unknown amout     Allergies   Penicillins and Clindamycin/lincomycin   Review of Systems Review of Systems  Constitutional: Negative for fever.  HENT: Negative for sore throat.   Eyes: Negative for redness.  Respiratory: Negative for shortness of breath.   Cardiovascular: Negative for chest pain.  Gastrointestinal: Positive for constipation.  Genitourinary: Negative for dysuria and flank pain.  Musculoskeletal: Negative for back pain and neck pain.  Skin: Negative for rash.  Neurological: Negative for headaches.  Hematological: Does not bruise/bleed easily.  Psychiatric/Behavioral: Negative for confusion.     Physical Exam Updated Vital Signs BP 135/70 (BP Location: Left Arm)   Pulse 108   Temp 97.8 F (36.6 C) (Oral)   Resp 19   SpO2 (!) 85% Comment: doctor is informed  Physical Exam  Constitutional: He appears well-developed and well-nourished. No distress.  HENT:  Mouth/Throat: Oropharynx is clear and moist.  Eyes: Conjunctivae are normal.  Neck: Neck supple. No tracheal deviation present.  Cardiovascular: Normal rate.   Pulmonary/Chest: Effort normal. No accessory muscle usage. No respiratory distress.  Diminished bs bilat.   Abdominal: Soft. Bowel sounds are normal. He exhibits distension. He exhibits no mass. There is no tenderness. There is no rebound and no guarding. A hernia is present.  Mild distension. No puls mass.  Genitourinary:  Genitourinary Comments: No cva tenderness. Moderate amount soft, light Ben[place] stool on rectal exam.     Musculoskeletal: He exhibits no edema.  Neurological: He is alert.  Skin: Skin is warm and dry. He is not diaphoretic.  Psychiatric: He has a normal mood and affect.  Nursing note and vitals reviewed.    ED Treatments / Results  Labs (all labs ordered are listed, but only abnormal results are displayed) Results for orders placed or performed during the hospital encounter of 02/28/16  CBC  Result Value Ref Range   WBC 19.8 (H) 4.0 - 10.5 K/uL   RBC 4.65 4.22 - 5.81 MIL/uL   Hemoglobin 13.5 13.0 - 17.0 g/dL   HCT 40.7 39.0 - 52.0 %   MCV 87.5 78.0 - 100.0 fL   MCH 29.0 26.0 - 34.0 pg   MCHC 33.2 30.0 - 36.0 g/dL   RDW 14.7 11.5 - 15.5 %   Platelets 169 150 -  400 K/uL  Comprehensive metabolic panel  Result Value Ref Range   Sodium 136 135 - 145 mmol/L   Potassium 3.9 3.5 - 5.1 mmol/L   Chloride 97 (L) 101 - 111 mmol/L   CO2 29 22 - 32 mmol/L   Glucose, Bld 188 (H) 65 - 99 mg/dL   BUN 22 (H) 6 - 20 mg/dL   Creatinine, Ser 0.83 0.61 - 1.24 mg/dL   Calcium 9.4 8.9 - 10.3 mg/dL   Total Protein 6.9 6.5 - 8.1 g/dL   Albumin 3.3 (L) 3.5 - 5.0 g/dL   AST 22 15 - 41 U/L   ALT 14 (L) 17 - 63 U/L   Alkaline Phosphatase 51 38 - 126 U/L   Total Bilirubin 1.3 (H) 0.3 - 1.2 mg/dL   GFR calc non Af Amer >60 >60 mL/min   GFR calc Af Amer >60 >60 mL/min   Anion gap 10 5 - 15  I-Stat CG4 Lactic Acid, ED  Result Value Ref Range   Lactic Acid, Venous 1.50 0.5 - 1.9 mmol/L   Ct Abdomen Pelvis Wo Contrast  Result Date: 02/05/2016 CLINICAL DATA:  Shortness of breath wheezing distended abdomen EXAM: CT ABDOMEN AND PELVIS WITHOUT CONTRAST TECHNIQUE: Multidetector CT imaging of the abdomen and pelvis was performed following the standard protocol without IV contrast. COMPARISON:  06/19/2013, radiograph 02/04/2016 FINDINGS: Lower chest: Small left-sided pleural effusion. Moderate emphysematous disease at the bilateral lung bases. Mild right middle lobe atelectasis. Patchy right lower lobe  atelectasis. Partial consolidation in the posterior left lower lobe. Partial consolidation within the lingula. Coronary artery calcifications. Aortic valvular calcifications. Mitral valvular calcifications. Heart size is slightly enlarged. Hepatobiliary: No focal hepatic abnormalities. Mild increased density in the gallbladder lumen could reflect small amount of sludge or stones. No biliary dilatation. Pancreas: Pancreas is atrophic.  No inflammatory changes. Spleen: Normal in size without focal abnormality. Adrenals/Urinary Tract: Mild nodular thickening of the adrenal glands without discrete nodule. No hydronephrosis. Small stones are vascular calcifications within the left kidney. The bladder demonstrates right posterior lobulated contour. Stomach/Bowel: No significant bowel dilatation. No obstruction. Scattered colon diverticula without acute inflammation. Vascular/Lymphatic: Multifocal ectasia of the abdominal aorta. Atherosclerotic vascular disease. No pathologically enlarged retroperitoneal or mesenteric lymph nodes. Reproductive: Prostate gland is enlarged with calcifications. Other: No free air or free fluid. Musculoskeletal: Status post right hip replacement with no acute dislocation. Multilevel degenerative changes of the spine with marked posterior facet changes. IMPRESSION: 1. No CT evidence for bowel obstruction. 2. Small left-sided pleural effusion. Moderate emphysematous disease at the bilateral lung bases. Partial consolidation in the left lower lobe and lingula, possible pneumonia. Continued imaging follow-up recommended to document resolution. 3. Mild cardiomegaly. 4. Atherosclerotic vascular disease of the aorta and branch vessels. Electronically Signed   By: Donavan Foil M.D.   On: 02/05/2016 00:31   Dg Chest 2 View  Result Date: 02/11/2016 CLINICAL DATA:  Acute on chronic respiratory failure with hypoxia patient reports shortness of breath and weakness today. History of COPD, aortic  insufficiency. EXAM: CHEST  2 VIEW COMPARISON:  Chest x-ray and chest CT scan of February 09, 2016 FINDINGS: The lungs remain hyperinflated. There remains increased density at the left lung base compatible with pneumonia and a known pleural effusion. The heart and pulmonary vascularity are normal. There is calcification in the wall of the thoracic aorta. The mediastinum is normal in width. The bony thorax exhibits no acute abnormality. IMPRESSION: Persistent left lower lobe pneumonia and pleural effusion not greatly changed  from the previous study. Underlying COPD. No CHF. Aortic atherosclerosis. Electronically Signed   By: David  Martinique M.D.   On: 02/11/2016 12:40   Dg Chest 2 View  Result Date: 02/09/2016 CLINICAL DATA:  Shortness of breath and wheezing. EXAM: CHEST  2 VIEW COMPARISON:  02/04/2016 and CT chest 06/19/2013. FINDINGS: Patient is rotated. Heart size stable. Thoracic aorta is calcified. Lungs are emphysematous. Airspace opacification is seen at the base of the left hemi thorax with a small left pleural effusion. Right lung is grossly clear. Tiny right pleural effusion. Degenerative changes are seen in the spine. IMPRESSION: 1. Left basilar airspace opacification may be due to pneumonia. Followup PA and lateral chest X-ray is recommended in 3-4 weeks following trial of antibiotic therapy to ensure resolution and exclude underlying malignancy. 2. Small bilateral pleural effusions, left greater than right. 3.  Aortic atherosclerosis (ICD10-170.0). Electronically Signed   By: Lorin Picket M.D.   On: 02/09/2016 08:58   Dg Abd 1 View  Result Date: 02/06/2016 CLINICAL DATA:  New onset vomiting and mid abdominal pain with bloating EXAM: ABDOMEN - 1 VIEW COMPARISON:  CT 02/04/2016 FINDINGS: Small left-sided pleural effusion. Bibasilar atelectasis or infiltrates. Moderate air distention of the stomach. Bowel gas pattern nonspecific. Moderate stool right colon. Status post right hip replacement.  IMPRESSION: 1. Small left-sided pleural effusion with bibasilar infiltrates. 2. Moderate air distention of the stomach. Overall gas pattern nonspecific. Electronically Signed   By: Donavan Foil M.D.   On: 02/06/2016 03:40   Ct Angio Chest Pe W Or Wo Contrast  Result Date: 02/09/2016 CLINICAL DATA:  Aortic insufficiency sepsis, COPD exacerbation with acute respiratory failure and hypoxia EXAM: CT ANGIOGRAPHY CHEST WITH CONTRAST TECHNIQUE: Multidetector CT imaging of the chest was performed using the standard protocol during bolus administration of intravenous contrast. Multiplanar CT image reconstructions and MIPs were obtained to evaluate the vascular anatomy. CONTRAST:  100 mL Isovue 370 intravenous COMPARISON:  Chest x-ray 01/1929 2017, CT scan chest 06/19/2013 FINDINGS: Cardiovascular: No evidence for filling defect within the central or segmental pulmonary arteries to suggest the presence of an acute embolus. There is diffuse ectasia of the thoracic aorta with atherosclerosis. Coronary artery calcifications. No dissection or mediastinal hematoma. Heart size is nonenlarged. Trace pericardial effusion or thickening. Mediastinum/Nodes: There is mild thickening of the esophagus. Nonspecific sub cm mediastinal lymph nodes. No enlarged hilar or axillary nodes. Trachea and mainstem bronchi are within normal limits. Lungs/Pleura: Trace right-sided pleural effusion. Small to moderate left-sided pleural effusion with mild loculation at the left lower lobe apex. Marked emphysematous disease within the bilateral lungs. There is a focal subpleural airspace density in the lingula which demonstrates central low attenuation suggesting necrosis. There is atelectasis or infiltrate within the left lower lobe. There is streaky infiltrate in the right lower lobe. Airspace disease within the lingula. No pneumothorax. Upper Abdomen: Images through the upper abdomen demonstrate no acute abnormalities. Left adrenal nodularity  unchanged. Musculoskeletal: Multilevel degenerative changes of the spine with exuberant anterior osteophytes at the cervical thoracic junction. Review of the MIP images confirms the above findings. IMPRESSION: 1. No CT evidence for acute pulmonary embolus or dissection. Mild diffuse ectasia of the thoracic aorta with atherosclerosis. 2. Trace right-sided pleural effusion, small to moderate left-sided pleural effusion with mild loculation at the apical portion of left lower lobe. Hazy infiltrate in the right lower lobe and atelectasis or mild infiltrate left lower lobe. Focal subpleural masslike area of consolidation within the lingula which demonstrates central low attenuation.  This could represent a focus of necrotic pneumonia or a possible septic embolus. 3. Marked emphysematous disease of the bilateral lungs Electronically Signed   By: Donavan Foil M.D.   On: 02/09/2016 21:59   Ct Abdomen Pelvis W Contrast  Result Date: 02/06/2016 CLINICAL DATA:  Sepsis. Community acquired pneumonia. Acute respiratory failure. Nausea vomiting and abdominal pain. EXAM: CT ABDOMEN AND PELVIS WITH CONTRAST TECHNIQUE: Multidetector CT imaging of the abdomen and pelvis was performed using the standard protocol following bolus administration of intravenous contrast. CONTRAST:  164mL ISOVUE-300 IOPAMIDOL (ISOVUE-300) INJECTION 61% COMPARISON:  Abdominal radiograph 02/06/2016 FINDINGS: Lower chest: Bilateral medium-sized pleural effusions, left-greater-than-right, with associated atelectasis. Consolidative opacities within the lingula. Coronary artery calcifications are noted. Hepatobiliary: Normal hepatic size and contours without focal liver lesion. No perihepatic ascites. No intra- or extrahepatic biliary dilatation. Normal gallbladder. Pancreas: Senescent fatty atrophy of the pancreas. No pancreatic ductal dilatation or peripancreatic fluid collection. Spleen: Normal. Adrenals/Urinary Tract: Normal adrenal glands. No  hydronephrosis or solid renal mass. Stomach/Bowel: There is a large stool ball within the rectum, measuring up to 7 cm. There is colonic interposition anterior to the liver. Despite review of multiplanar reformatted images in 3 orthogonal planes, the appendix could not be adequately visualized. However, there is no inflammatory stranding or free fluid within the right lower quadrant. There is no intra-abdominal fluid collection. No evidence of acute colonic or enteric inflammation. No portal venous gas. Vascular/Lymphatic: There is extensive atherosclerotic calcification of the abdominal aorta. Calcification extends into the iliac artery is. The major branches of the abdominal aorta are proximally patent. The portal vein, superior mesenteric vein and splenic vein are patent. No abdominal or pelvic adenopathy. Reproductive: Assessment of the prostate is limited by streak artifact from right hip prosthesis. Musculoskeletal: There is a right total hip arthroplasty. There are bridging osteophytes at both sacroiliac joints. Multilevel severe facet arthrosis with narrowing of the spinal canal at L2-L3. Grade 1 L4-L5 anterolisthesis. No lytic or blastic lesions. Normal visualized extrathoracic and extraperitoneal soft tissues. Other: No contributory non-categorized findings. IMPRESSION: 1. Bilateral medium-sized pleural effusions with associated atelectasis. Consolidative opacity within the lingula, which could indicate pneumonia. 2. Large stool ball within the rectum without evidence of stercoral inflammation. 3. Aortic atherosclerosis. Electronically Signed   By: Ulyses Jarred M.D.   On: 02/06/2016 18:00   Dg Esophagus  Result Date: 02/10/2016 CLINICAL DATA:  Dysphagia.  Choked on foods this morning EXAM: ESOPHOGRAM/BARIUM SWALLOW TECHNIQUE: Single contrast examination was performed using  thin barium. FLUOROSCOPY TIME:  Fluoroscopy Time:  54 seconds Radiation Exposure Index (if provided by the fluoroscopic device):  3.8 mGy Number of Acquired Spot Images: 0 COMPARISON:  Chest CT 02/09/2016 FINDINGS: Fluoroscopic evaluation of swallowing demonstrates disruption of all primary esophageal peristaltic waves with stasis of contrast in the esophagus and occasional distal tertiary contractions. No fixed stricture, fold thickening or mass. IMPRESSION: Esophageal dysmotility.  No fixed stricture. Electronically Signed   By: Rolm Baptise M.D.   On: 02/10/2016 14:36   Dg Chest Portable 1 View  Result Date: 02/04/2016 CLINICAL DATA:  82 year old male with fever and hypoxemia. EXAM: PORTABLE CHEST 1 VIEW COMPARISON:  Chest CT dated 06/19/2013 FINDINGS: There is emphysematous changes of the lungs. Small left pleural effusion with associated left lung base compressive atelectasis versus infiltrate. There bibasilar atelectatic changes/ scarring. No pneumothorax. There is mild cardiomegaly. The aorta is tortuous. There is atherosclerotic calcification of the thoracic aorta. Old healed left rib fracture. No acute fracture. IMPRESSION: Small left pleural  effusion with associated left lung base atelectasis versus infiltrate. Clinical correlation and follow-up resolution recommended. Emphysema. Electronically Signed   By: Anner Crete M.D.   On: 02/04/2016 22:56   Dg Abd Acute W/chest  Result Date: 02/28/2016 CLINICAL DATA:  Constipation.  COPD. EXAM: DG ABDOMEN ACUTE W/ 1V CHEST COMPARISON:  02/11/2016 FINDINGS: New patchy airspace opacity in the right upper lobe. Continued opacity at the left lung base. Underlying COPD. Heart is normal size. No effusions. Dilated small bowel loops in the mid abdomen with air-fluid levels concerning for small bowel obstruction. There is a paucity of gas in the colon. No free air organomegaly. IMPRESSION: COPD. Airspace opacities in the left lower lobe and right upper lobe concerning for multifocal pneumonia. Dilated small bowel loops in the mid abdomen concerning for small bowel obstruction.  Electronically Signed   By: Rolm Baptise M.D.   On: 02/28/2016 11:48    EKG  EKG Interpretation  Date/Time:  Saturday February 28 2016 10:52:33 EST Ventricular Rate:  90 PR Interval:    QRS Duration: 112 QT Interval:  346 QTC Calculation: 424 R Axis:   15 Text Interpretation:  Sinus tachycardia Baseline wander Poor data quality Confirmed by Ashok Cordia  MD, Lennette Bihari ([id number]) on 02/28/2016 12:15:27 PM       Radiology Dg Abd Acute W/chest  Result Date: 02/28/2016 CLINICAL DATA:  Constipation.  COPD. EXAM: DG ABDOMEN ACUTE W/ 1V CHEST COMPARISON:  02/11/2016 FINDINGS: New patchy airspace opacity in the right upper lobe. Continued opacity at the left lung base. Underlying COPD. Heart is normal size. No effusions. Dilated small bowel loops in the mid abdomen with air-fluid levels concerning for small bowel obstruction. There is a paucity of gas in the colon. No free air organomegaly. IMPRESSION: COPD. Airspace opacities in the left lower lobe and right upper lobe concerning for multifocal pneumonia. Dilated small bowel loops in the mid abdomen concerning for small bowel obstruction. Electronically Signed   By: Rolm Baptise M.D.   On: 02/28/2016 11:48    Procedures Procedures (including critical care time)  Medications Ordered in ED Medications  sodium chloride 0.9 % bolus 500 mL (not administered)  ondansetron (ZOFRAN) injection 4 mg (not administered)     Initial Impression / Assessment and Plan / ED Course  I have reviewed the triage vital signs and the nursing notes.  Pertinent labs & imaging results that were available during my care of the patient were reviewed by me and considered in my medical decision making (see chart for details).  Clinical Course     Iv ns bolus. zofran iv.  Xrays. Labs.   Reviewed nursing notes and prior charts for additional history.   Patients room air pulse ox is 83-85% - improves to 90% on 2 liters [place].  Recent tx pna. New infiltrate on cxr.  Will rx  abx. Cultures sent. Lactate normal.   ?sbo on plain films. Patient is distended. ?etiology - pt does have inguinal hernia, which is not painful/tender, however is also not reducible.   Medical service contacted for admission, and general surgeon consulted re sbo.  Discussed with Dr Tana Coast - will admit.  Discussed w Dr Barry Dienes - they will see in consult.    NG to LIWS.  Bilious material suctioned from stomach, approx 200 cc.   1513, Ct resulted.  Discussed w pt - pt indicates that during his recent hospital stay he first noted a hernia.  Unable to reduce hernia in ED.  General surgery re-paged re  incarc hernia/sbo for surgical tx.   1525 discussed ct w Dr Tana Coast.  1535, discussed ct with Dr Barry Dienes - she will see in ED.        Final Clinical Impressions(s) / ED Diagnoses   Final diagnoses:  None    New Prescriptions New Prescriptions   No medications on file          Lajean Saver, MD 02/28/16 1542

## 2016-02-28 NOTE — Op Note (Signed)
Primary repair of incarcerated direct right inguinal hernia with obstruction  Indications: The patient presented with an incarcerated right inguinal hernia with obstruction.  Pre-operative Diagnosis: right obstructed incarcerated inguinal hernia.  Post-operative Diagnosis: Incarcerated right inguinal hernia   Surgeon: Stark Klein   Anesthesia: General endotracheal anesthesia, exparel  ASA Class: 3E  Procedure Details  The patient was seen in the ED and in the Holding Room. The risks, benefits, complications, treatment options, and expected outcomes were discussed with the patient and his family. The possibilities of bowel injury or leak, recurrent hernia, respiratory failure, wound infection, and other were discussed. There was concurrence with the proposed plan, and informed consent was obtained. The site of surgery was properly noted/marked. The patient was taken to the Operating Room, and the procedure verified as above with possible ex lap and possible bowel resection. A Time Out was held and the above information confirmed.  The patient was placed in the supine position and underwent induction of anesthesia, the lower abdomen and groin was prepped and draped in the standard fashion.  An obliquely oriented transverse incision was made. Dissection was carried through the soft tissue to expose the inguinal canal and inguinal ligament along its lower edge.  The spermatic cord was visible There was no indirect hernia component.  The direct hernia was identified medially.  The sac was opened.  A small knuckle of bowel was seen.  It was a bit congested, but not ischemic.  It was reduced.  The sac was closed with a pursestring 2-0 silk suture.  The sac was removed.  The inferior shelving edge was approximated to the lower border of the medial external oblique with interrupted 2-0 vicryl sutures.  The entire area was anesthetized with Exparel.    The ilioinguinal nerve was not seen.  The scarpa's  fascia was closed with running 2-0 vicryl.  The skin was loosely reapproximated with three 3-0 vicryl sutures and staples.  An island dressing was placed over the wound. Needle, sponge, and instrument counts were correct x 2.

## 2016-02-29 ENCOUNTER — Inpatient Hospital Stay (HOSPITAL_COMMUNITY): Payer: Medicare Other

## 2016-02-29 LAB — BASIC METABOLIC PANEL
ANION GAP: 5 (ref 5–15)
BUN: 24 mg/dL — ABNORMAL HIGH (ref 6–20)
CALCIUM: 7.9 mg/dL — AB (ref 8.9–10.3)
CO2: 32 mmol/L (ref 22–32)
Chloride: 103 mmol/L (ref 101–111)
Creatinine, Ser: 0.77 mg/dL (ref 0.61–1.24)
GFR calc non Af Amer: >60 mL/min (ref >60)
Glucose, Bld: 137 mg/dL — ABNORMAL HIGH (ref 65–99)
Potassium: 3.9 mmol/L (ref 3.5–5.1)
Sodium: 140 mmol/L (ref 135–145)

## 2016-02-29 LAB — CBC
HCT: 34.7 % — ABNORMAL LOW (ref 39.0–52.0)
HEMOGLOBIN: 11 g/dL — AB (ref 13.0–17.0)
MCH: 29 pg (ref 26.0–34.0)
MCHC: 31.7 g/dL (ref 30.0–36.0)
MCV: 91.6 fL (ref 78.0–100.0)
Platelets: 138 10*3/uL — ABNORMAL LOW (ref 150–400)
RBC: 3.79 MIL/uL — AB (ref 4.22–5.81)
RDW: 15.4 % (ref 11.5–15.5)
WBC: 15.2 10*3/uL — ABNORMAL HIGH (ref 4.0–10.5)

## 2016-02-29 LAB — MRSA PCR SCREENING: MRSA BY PCR: NEGATIVE

## 2016-02-29 LAB — STREP PNEUMONIAE URINARY ANTIGEN: Strep Pneumo Urinary Antigen: NEGATIVE

## 2016-02-29 MED ORDER — ORAL CARE MOUTH RINSE
15.0000 mL | Freq: Two times a day (BID) | OROMUCOSAL | Status: DC
  Administered 2016-02-29 – 2016-03-08 (×11): 15 mL via OROMUCOSAL

## 2016-02-29 MED ORDER — BISACODYL 10 MG RE SUPP
10.0000 mg | Freq: Once | RECTAL | Status: AC
  Administered 2016-02-29: 10 mg via RECTAL
  Filled 2016-02-29: qty 1

## 2016-02-29 MED ORDER — PANTOPRAZOLE SODIUM 40 MG IV SOLR
40.0000 mg | Freq: Two times a day (BID) | INTRAVENOUS | Status: DC
  Administered 2016-02-29 – 2016-03-04 (×9): 40 mg via INTRAVENOUS
  Filled 2016-02-29 (×9): qty 40

## 2016-02-29 MED ORDER — SODIUM CHLORIDE 0.9 % IV SOLN
INTRAVENOUS | Status: DC
  Administered 2016-02-29 – 2016-03-01 (×2): via INTRAVENOUS

## 2016-02-29 MED ORDER — SODIUM CHLORIDE 0.9 % IV BOLUS (SEPSIS)
250.0000 mL | Freq: Once | INTRAVENOUS | Status: AC
  Administered 2016-02-29: 250 mL via INTRAVENOUS

## 2016-02-29 NOTE — Progress Notes (Signed)
Pt. A-line  reading 89/35 (52). Manual blood pressure reading 84/36 (54). [facility] paged. Orders received to increase normal saline rate from 75cc/hr to 100cc/hr for 4 hours starting now. Will continue to monitor.

## 2016-02-29 NOTE — Progress Notes (Signed)
1 Day Post-Op  Subjective: Denies pain.  No flatus, but has had 3 loose stools.  No n/v.  Trying off venti mask this AM.  Denies SOB.    Objective: Vital signs in last 24 hours: Temp:  [97.8 F (36.6 C)-98.5 F (36.9 C)] 98.4 F (36.9 C) (11/19 0706) Pulse Rate:  [53-108] 94 (11/19 0756) Resp:  [13-25] 25 (11/19 0756) BP: (77-145)/(35-95) 114/45 (11/19 0756) SpO2:  [72 %-98 %] 94 % (11/19 0756) Arterial Line BP: (91-134)/(33-55) 112/41 (11/19 0600) FiO2 (%):  [40 %-50 %] 50 % (11/18 2115) Weight:  [83.9 kg (185 lb)-85.2 kg (187 lb 13.3 oz)] 85.2 kg (187 lb 13.3 oz) (11/19 0000) Last BM Date: 02/29/16  Intake/Output from previous day: 11/18 0701 - 11/19 0700 In: 3716.3 [I.V.:2666.3; IV Piggyback:1050] Out: 1601 [Urine:530; Emesis/NG output:1070; Stool:1] Intake/Output this shift: No intake/output data recorded.  General appearance: alert, cooperative and no distress Resp: breathing comfortably, but a little tachypneic GI: soft, non tender, much less distended.  Dressing in right groin c/d/i.  Lab Results:   Recent Labs  02/28/16 1058 02/29/16 0500  WBC 19.8* 15.2*  HGB 13.5 11.0*  HCT 40.7 34.7*  PLT 169 138*   BMET  Recent Labs  02/28/16 1058 02/29/16 0500  NA 136 140  K 3.9 3.9  CL 97* 103  CO2 29 32  GLUCOSE 188* 137*  BUN 22* 24*  CREATININE 0.83 0.77  CALCIUM 9.4 7.9*   PT/INR No results for input(s): LABPROT, INR in the last 72 hours. ABG  Recent Labs  02/28/16 2025  PHART 7.417  HCO3 33.0*    Studies/Results: Ct Abdomen Pelvis W Contrast  Result Date: 02/28/2016 CLINICAL DATA:  Abdominal pain with nausea and vomiting EXAM: CT ABDOMEN AND PELVIS WITH CONTRAST TECHNIQUE: Multidetector CT imaging of the abdomen and pelvis was performed using the standard protocol following bolus administration of intravenous contrast. CONTRAST:  50mL ISOVUE-300 IOPAMIDOL (ISOVUE-300) INJECTION 61% COMPARISON:  CT abdomen and pelvis February 06, 2016 ; abdominal  radiographic series February 28, 2016 FINDINGS: Lower chest: There is airspace consolidation throughout much of the left lower lobe and inferior lingula with moderate pleural effusion on the left. There is patchy consolidation in the medial and posterior segments of the right lower lobe. There is atelectatic change in the right middle lobe. There is a focal stable nodular opacity in the lateral segment of the right lower lobe measuring 8 x 6 mm. There are multiple foci of coronary artery calcification. There is a hiatal hernia. Hepatobiliary: No focal liver lesions are evident. Gallbladder wall is not appreciably thickened. There is no biliary duct dilatation. Pancreas: There is no pancreatic mass or inflammatory focus. Spleen: No splenic lesions are evident. Adrenals/Urinary Tract: The right adrenal appears normal. There is an apparent adenoma in the left adrenal measuring 1.2 x 1.2 cm. Kidneys bilaterally show no mass or hydronephrosis on either side. There is moderate prominence of renal sinus fat bilaterally. There is no renal or ureteral calculus bilaterally. Urinary bladder is midline with overall wall thickness within normal limits. There is a diverticulum arising from the rightward aspect of the urinary bladder measuring 3.4 x 2.2 cm. Note also that there is mild irregular thickening along the posterior aspect of the urinary bladder wall. Stomach/Bowel: There is proximal small bowel obstruction which extends to the level of a right inguinal hernia. Bowel extends into this hernia. A transition zone is noted at the level of the right inguinal hernia indicative of a degree of  bowel obstruction. This appearance suggests that there may be a degree of incarceration in this right inguinal hernia. There is no appreciable bowel wall or mesenteric thickening. Nasogastric tube tip is in the stomach. Stomach is filled with fluid and air. No free air or portal venous air is evident. No bowel pneumatosis evident.  Vascular/Lymphatic: There is extensive atherosclerotic calcification throughout the aorta and major pelvic arterial vessels extending to the proximal thigh regions. There is dilatation of the distal abdominal aorta with a maximum transverse diameter of 3.0 x 3.0 cm. There is no periaortic fluid. There is calcification at the origins of the major mesenteric vessels. There is no appreciable adenopathy in the abdomen or pelvis. Reproductive: Prostate is prominent, containing multiple calcifications. There is no pelvic mass outside of the prostate and potential urinary bladder. No pelvic fluid collection evident. Other: Appendix appears unremarkable. No abscess or ascites evident in the abdomen or pelvis. In addition to the apparent incarcerated right inguinal hernia. There is fat in the left inguinal ring. Musculoskeletal: There is extensive lumbar spine osteoarthritic change. There is spinal stenosis at L2-3, L3-4, L4-5, and L5-S1 due to extensive bony hypertrophy and diffuse disc protrusion. There are no blastic or lytic bone lesions. There is no intramuscular or abdominal wall lesion. Note that the patient has had a previous total hip replacement right. There is moderate osteoarthritic change in the left hip joint. IMPRESSION: Bowel obstruction at the level of a right inguinal hernia. Suspect a degree of incarceration in this hernia. Transition zone noted at the level of this hernia near the jejunoileal junction. No free air. Extensive airspace consolidation in the inferior lingula and left lower lobe with fairly small left pleural effusion. There is also patchy infiltrate in the posterior and medial segments of the right lower lobe. 7 x 5 mm nodular opacity right lower lobe. Non-contrast chest CT at 6-12 months is recommended. If the nodule is stable at time of repeat CT, then future CT at 18-24 months (from today's scan) is considered optional for low-risk patients, but is recommended for high-risk patients. This  recommendation follows the consensus statement: Guidelines for Management of Incidental Pulmonary Nodules Detected on CT Images: From the Fleischner Society 2017; Radiology 2017; 284:228-243. Mild irregularity along the urinary bladder wall posteriorly raises concern for potential inflammation or early neoplasm. This finding may well warrant direct visualization/cystoscopy to further evaluate. There is a rightward located urinary bladder diverticulum. No renal or ureteral calculus. No hydronephrosis. Prominent prostate with multiple prostatic calculi. Advise correlation with PSA. Small left adrenal adenoma. Extensive atherosclerotic calcification in the aorta and essentially all abdominal and pelvic arterial vessels as well as coronary artery calcification. Mild dilatation of the distal aorta. Recommend followup by ultrasound in 3 years. This recommendation follows ACR consensus guidelines: White Paper of the ACR Incidental Findings Committee II on Vascular Findings. J Am Coll Radiol 2013; 10:789-794. Multilevel spinal stenosis, multifactorial. Small hiatal hernia. Stomach filled with fluid and air. Nasogastric tube tip in stomach. Critical Value/emergent results were called by telephone at the time of interpretation on 02/28/2016 at 3:05 pm to Dr. Lajean Saver , who verbally acknowledged these results. Electronically Signed   By: Lowella Grip III M.D.   On: 02/28/2016 15:05   Dg Abd Acute W/chest  Result Date: 02/28/2016 CLINICAL DATA:  Constipation.  COPD. EXAM: DG ABDOMEN ACUTE W/ 1V CHEST COMPARISON:  02/11/2016 FINDINGS: New patchy airspace opacity in the right upper lobe. Continued opacity at the left lung base. Underlying COPD. Heart  is normal size. No effusions. Dilated small bowel loops in the mid abdomen with air-fluid levels concerning for small bowel obstruction. There is a paucity of gas in the colon. No free air organomegaly. IMPRESSION: COPD. Airspace opacities in the left lower lobe and  right upper lobe concerning for multifocal pneumonia. Dilated small bowel loops in the mid abdomen concerning for small bowel obstruction. Electronically Signed   By: Rolm Baptise M.D.   On: 02/28/2016 11:48    Anti-infectives: Anti-infectives    Start     Dose/Rate Route Frequency Ordered Stop   02/29/16 0600  ciprofloxacin (CIPRO) IVPB 400 mg  Status:  Discontinued     400 mg 200 mL/hr over 60 Minutes Intravenous On call to O.R. 02/28/16 1731 02/29/16 0603   02/29/16 0200  vancomycin (VANCOCIN) IVPB 1000 mg/200 mL premix     1,000 mg 200 mL/hr over 60 Minutes Intravenous Every 12 hours 02/28/16 1340     02/28/16 2200  aztreonam (AZACTAM) 2 g in dextrose 5 % 50 mL IVPB  Status:  Discontinued     2 g 100 mL/hr over 30 Minutes Intravenous Every 8 hours 02/28/16 1340 02/28/16 1430   02/28/16 2200  aztreonam (AZACTAM) 2 g in dextrose 5 % 50 mL IVPB     2 g 100 mL/hr over 30 Minutes Intravenous Every 8 hours 02/28/16 2140 03/07/16 2159   02/28/16 1330  aztreonam (AZACTAM) 2 g in dextrose 5 % 50 mL IVPB     2 g 100 mL/hr over 30 Minutes Intravenous  Once 02/28/16 1317 02/28/16 1417   02/28/16 1330  vancomycin (VANCOCIN) IVPB 1000 mg/200 mL premix     1,000 mg 200 mL/hr over 60 Minutes Intravenous  Once 02/28/16 1317 02/28/16 1737      Assessment/Plan: s/p Procedure(s): RIGHT INCARCERATED INGUINAL HERNIA, (Right) Doing well.  OOB to chair today. Would leave NGT due to duration of obstruction..  Stools are most likely post obstructive diarrhea.   Hope to pull NGT tomorrow.   Ice chips ad lib OK.     LOS: 1 day    [facility] 02/29/2016

## 2016-02-29 NOTE — Progress Notes (Addendum)
PROGRESS NOTE  Allen Beasley  [id number] DOB: Nov 04, 1923 DOA: 02/28/2016 PCP: Geoffery Lyons, MD  Brief Narrative:  Patient is a 82 year old male with COPD, aortic insufficiency, hypertension, hyperlipidemia, was recently admitted from 10/26-11/7/17 for sepsis and necrotizing pneumonia, was discharged with a 4-week course of Levaquin.  He returned to the ER with nausea, vomiting, abdominal distention, pain, and inability to have a BM.  CT scan ab/p demonstrated an incarcerated direct right inguinal hernia.  He underwent urgent primary repair by Dr. Barry Dienes on 11/18.   Assessment & Plan:   Principal Problem:   SBO (small bowel obstruction) Active Problems:   COPD with emphysema (HCC)   HTN (hypertension)   Acute on chronic respiratory failure with hypoxia (HCC)   Esophageal reflux   Multifocal pneumonia    SBO (small bowel obstruction) due to incarcerated right inguinal hernia -  NG to LIS today -  Appreciate general surgery assistance -  Increase IVF if needed to keep uop > 49mL/h  Active Problems: Chronic respiratory failure due to COPD with emphysema ([facility]) and recent pneumonia - Currently stable, no wheezing, will continue Xopenex and Spiriva  Necrotizing pneumonia, still undergoing treatment from last admission and may have some superimposed aspiration pneumonia -  Continue vanc, aztreonam today, but if otherwise continuing to improve, would change back to monotherapy FQ as before to complete course of treatment.      HTN (hypertension), hypotensive overnight and this morning -  Increase IVF, boluses prn -  Pt not receiving antihypertensives -  Consider adrenal insufficiency due to recent steroids if BP not improving with IVF  Esophageal reflux, stable, continue IV PPI  DVT prophylaxis:  heparin Code Status:  full Family Communication:  Patient alone Disposition Plan:  Pending tolerating diet.  PT recommending SNF   Consultants:   [facility],  Dr. Barry Dienes  Procedures:  Primary repair of incarcerated direct right inguinal hernia.    Antimicrobials:  Anti-infectives    Start     Dose/Rate Route Frequency Ordered Stop   02/29/16 0600  ciprofloxacin (CIPRO) IVPB 400 mg  Status:  Discontinued     400 mg 200 mL/hr over 60 Minutes Intravenous On call to O.R. 02/28/16 1731 02/29/16 0603   02/29/16 0200  vancomycin (VANCOCIN) IVPB 1000 mg/200 mL premix     1,000 mg 200 mL/hr over 60 Minutes Intravenous Every 12 hours 02/28/16 1340     02/28/16 2200  aztreonam (AZACTAM) 2 g in dextrose 5 % 50 mL IVPB  Status:  Discontinued     2 g 100 mL/hr over 30 Minutes Intravenous Every 8 hours 02/28/16 1340 02/28/16 1430   02/28/16 2200  aztreonam (AZACTAM) 2 g in dextrose 5 % 50 mL IVPB     2 g 100 mL/hr over 30 Minutes Intravenous Every 8 hours 02/28/16 2140 03/07/16 2159   02/28/16 1330  aztreonam (AZACTAM) 2 g in dextrose 5 % 50 mL IVPB     2 g 100 mL/hr over 30 Minutes Intravenous  Once 02/28/16 1317 02/28/16 1417   02/28/16 1330  vancomycin (VANCOCIN) IVPB 1000 mg/200 mL premix     1,000 mg 200 mL/hr over 60 Minutes Intravenous  Once 02/28/16 1317 02/28/16 1737           Subjective:  Feeling well.  No abdominal pain.  Three watery BMs this morning and hearing some gurgles.  Denies dyspnea and chest pain.    Objective: Vitals:   02/29/16 0800 02/29/16 0900 02/29/16 1000 02/29/16 1153  BP: (!) 109/37  (!) 93/33   Pulse: 92 89 84   Resp: (!) 21 (!) 22 15   Temp:    98.4 F (36.9 C)  TempSrc:    Oral  SpO2: 93% 94% 98%   Weight:      Height:        Intake/Output Summary (Last 24 hours) at 02/29/16 1250 Last data filed at 02/29/16 1000  Gross per 24 hour  Intake          3553.75 ml  Output             1601 ml  Net          1952.75 ml   Filed Weights   02/28/16 1241 02/29/16 0000  Weight: 83.9 kg (185 lb) 85.2 kg (187 lb 13.3 oz)    Examination:  General exam:  Adult male.  No acute distress.  HEENT:  NCAT,  MMM Respiratory system:  Diminished bilateral BS, no focal rales, rhonchi, or wheeze Cardiovascular system: Regular rate and rhythm, normal S1/S2. No murmurs, rubs, gallops or clicks.  Warm extremities Gastrointestinal system:  Hypoactive bowel sounds, soft, mildly distended, nontender.  Dressing over right groin with some old blood  MSK:  Normal tone and bulk, no lower extremity edema Neuro:  Grossly moves all extremities    Data Reviewed: I have personally reviewed following labs and imaging studies  CBC:  Recent Labs Lab 02/28/16 1058 02/29/16 0500  WBC 19.8* 15.2*  HGB 13.5 11.0*  HCT 40.7 34.7*  MCV 87.5 91.6  PLT 169 0000000*   Basic Metabolic Panel:  Recent Labs Lab 02/28/16 1058 02/29/16 0500  NA 136 140  K 3.9 3.9  CL 97* 103  CO2 29 32  GLUCOSE 188* 137*  BUN 22* 24*  CREATININE 0.83 0.77  CALCIUM 9.4 7.9*   GFR: Estimated Creatinine Clearance: 71.4 mL/min (by C-G formula based on SCr of 0.77 mg/dL). Liver Function Tests:  Recent Labs Lab 02/28/16 1058  AST 22  ALT 14*  ALKPHOS 51  BILITOT 1.3*  PROT 6.9  ALBUMIN 3.3*   No results for input(s): LIPASE, AMYLASE in the last 168 hours. No results for input(s): AMMONIA in the last 168 hours. Coagulation Profile: No results for input(s): INR, PROTIME in the last 168 hours. Cardiac Enzymes: No results for input(s): CKTOTAL, CKMB, CKMBINDEX, TROPONINI in the last 168 hours. BNP (last 3 results) No results for input(s): PROBNP in the last 8760 hours. HbA1C: No results for input(s): HGBA1C in the last 72 hours. CBG: No results for input(s): GLUCAP in the last 168 hours. Lipid Profile: No results for input(s): CHOL, HDL, LDLCALC, TRIG, CHOLHDL, LDLDIRECT in the last 72 hours. Thyroid Function Tests: No results for input(s): TSH, T4TOTAL, FREET4, T3FREE, THYROIDAB in the last 72 hours. Anemia Panel: No results for input(s): VITAMINB12, FOLATE, FERRITIN, TIBC, IRON, RETICCTPCT in the last 72  hours. Urine analysis:    Component Value Date/Time   COLORURINE AMBER (A) 02/28/2016 1255   APPEARANCEUR CLOUDY (A) 02/28/2016 1255   LABSPEC 1.022 02/28/2016 1255   PHURINE 6.0 02/28/2016 1255   GLUCOSEU NEGATIVE 02/28/2016 1255   HGBUR LARGE (A) 02/28/2016 1255   BILIRUBINUR SMALL (A) 02/28/2016 1255   KETONESUR NEGATIVE 02/28/2016 1255   PROTEINUR 30 (A) 02/28/2016 1255   UROBILINOGEN 0.2 08/19/2012 1511   NITRITE NEGATIVE 02/28/2016 1255   LEUKOCYTESUR SMALL (A) 02/28/2016 1255   Sepsis Labs: @LABRCNTIP (procalcitonin:4,lacticidven:4)  ) Recent Results (from the past 240 hour(s))  MRSA PCR  Screening     Status: None   Collection Time: 02/28/16 10:44 PM  Result Value Ref Range Status   MRSA by PCR NEGATIVE NEGATIVE Final    Comment:        The GeneXpert MRSA Assay (FDA approved for NASAL specimens only), is one component of a comprehensive MRSA colonization surveillance program. It is not intended to diagnose MRSA infection nor to guide or monitor treatment for MRSA infections.       Radiology Studies: Dg Abd 1 View  Result Date: 02/29/2016 CLINICAL DATA:  Small bowel obstruction EXAM: ABDOMEN - 1 VIEW COMPARISON:  CT from yesterday FINDINGS: Improved maximal distention of small bowel loops. Many loops were fluid-filled previously, limiting sensitivity of radiography. No concerning mass effect or gas collection. The nasogastric tube tip is coiled on itself at the level of the lower esophagus and gastric cardia, likely limiting utility. Atherosclerosis. These results will be called to the ordering clinician or representative by the Radiologist Assistant, and communication documented in the PACS or zVision Dashboard. IMPRESSION: 1. Improved small bowel obstruction pattern after right inguinal hernia repair. 2. Malpositioned nasogastric tube which is coiled on itself at the GE junction, likely limiting effectiveness. Consider advancement and follow-up. Electronically  Signed   By: Monte Fantasia M.D.   On: 02/29/2016 08:12   Ct Abdomen Pelvis W Contrast  Result Date: 02/28/2016 CLINICAL DATA:  Abdominal pain with nausea and vomiting EXAM: CT ABDOMEN AND PELVIS WITH CONTRAST TECHNIQUE: Multidetector CT imaging of the abdomen and pelvis was performed using the standard protocol following bolus administration of intravenous contrast. CONTRAST:  39mL ISOVUE-300 IOPAMIDOL (ISOVUE-300) INJECTION 61% COMPARISON:  CT abdomen and pelvis February 06, 2016 ; abdominal radiographic series February 28, 2016 FINDINGS: Lower chest: There is airspace consolidation throughout much of the left lower lobe and inferior lingula with moderate pleural effusion on the left. There is patchy consolidation in the medial and posterior segments of the right lower lobe. There is atelectatic change in the right middle lobe. There is a focal stable nodular opacity in the lateral segment of the right lower lobe measuring 8 x 6 mm. There are multiple foci of coronary artery calcification. There is a hiatal hernia. Hepatobiliary: No focal liver lesions are evident. Gallbladder wall is not appreciably thickened. There is no biliary duct dilatation. Pancreas: There is no pancreatic mass or inflammatory focus. Spleen: No splenic lesions are evident. Adrenals/Urinary Tract: The right adrenal appears normal. There is an apparent adenoma in the left adrenal measuring 1.2 x 1.2 cm. Kidneys bilaterally show no mass or hydronephrosis on either side. There is moderate prominence of renal sinus fat bilaterally. There is no renal or ureteral calculus bilaterally. Urinary bladder is midline with overall wall thickness within normal limits. There is a diverticulum arising from the rightward aspect of the urinary bladder measuring 3.4 x 2.2 cm. Note also that there is mild irregular thickening along the posterior aspect of the urinary bladder wall. Stomach/Bowel: There is proximal small bowel obstruction which extends to  the level of a right inguinal hernia. Bowel extends into this hernia. A transition zone is noted at the level of the right inguinal hernia indicative of a degree of bowel obstruction. This appearance suggests that there may be a degree of incarceration in this right inguinal hernia. There is no appreciable bowel wall or mesenteric thickening. Nasogastric tube tip is in the stomach. Stomach is filled with fluid and air. No free air or portal venous air is evident. No bowel  pneumatosis evident. Vascular/Lymphatic: There is extensive atherosclerotic calcification throughout the aorta and major pelvic arterial vessels extending to the proximal thigh regions. There is dilatation of the distal abdominal aorta with a maximum transverse diameter of 3.0 x 3.0 cm. There is no periaortic fluid. There is calcification at the origins of the major mesenteric vessels. There is no appreciable adenopathy in the abdomen or pelvis. Reproductive: Prostate is prominent, containing multiple calcifications. There is no pelvic mass outside of the prostate and potential urinary bladder. No pelvic fluid collection evident. Other: Appendix appears unremarkable. No abscess or ascites evident in the abdomen or pelvis. In addition to the apparent incarcerated right inguinal hernia. There is fat in the left inguinal ring. Musculoskeletal: There is extensive lumbar spine osteoarthritic change. There is spinal stenosis at L2-3, L3-4, L4-5, and L5-S1 due to extensive bony hypertrophy and diffuse disc protrusion. There are no blastic or lytic bone lesions. There is no intramuscular or abdominal wall lesion. Note that the patient has had a previous total hip replacement right. There is moderate osteoarthritic change in the left hip joint. IMPRESSION: Bowel obstruction at the level of a right inguinal hernia. Suspect a degree of incarceration in this hernia. Transition zone noted at the level of this hernia near the jejunoileal junction. No free air.  Extensive airspace consolidation in the inferior lingula and left lower lobe with fairly small left pleural effusion. There is also patchy infiltrate in the posterior and medial segments of the right lower lobe. 7 x 5 mm nodular opacity right lower lobe. Non-contrast chest CT at 6-12 months is recommended. If the nodule is stable at time of repeat CT, then future CT at 18-24 months (from today's scan) is considered optional for low-risk patients, but is recommended for high-risk patients. This recommendation follows the consensus statement: Guidelines for Management of Incidental Pulmonary Nodules Detected on CT Images: From the Fleischner Society 2017; Radiology 2017; 284:228-243. Mild irregularity along the urinary bladder wall posteriorly raises concern for potential inflammation or early neoplasm. This finding may well warrant direct visualization/cystoscopy to further evaluate. There is a rightward located urinary bladder diverticulum. No renal or ureteral calculus. No hydronephrosis. Prominent prostate with multiple prostatic calculi. Advise correlation with PSA. Small left adrenal adenoma. Extensive atherosclerotic calcification in the aorta and essentially all abdominal and pelvic arterial vessels as well as coronary artery calcification. Mild dilatation of the distal aorta. Recommend followup by ultrasound in 3 years. This recommendation follows ACR consensus guidelines: White Paper of the ACR Incidental Findings Committee II on Vascular Findings. J Am Coll Radiol 2013; 10:789-794. Multilevel spinal stenosis, multifactorial. Small hiatal hernia. Stomach filled with fluid and air. Nasogastric tube tip in stomach. Critical Value/emergent results were called by telephone at the time of interpretation on 02/28/2016 at 3:05 pm to Dr. Lajean Saver , who verbally acknowledged these results. Electronically Signed   By: Lowella Grip III M.D.   On: 02/28/2016 15:05   Dg Abd Acute W/chest  Result Date:  02/28/2016 CLINICAL DATA:  Constipation.  COPD. EXAM: DG ABDOMEN ACUTE W/ 1V CHEST COMPARISON:  02/11/2016 FINDINGS: New patchy airspace opacity in the right upper lobe. Continued opacity at the left lung base. Underlying COPD. Heart is normal size. No effusions. Dilated small bowel loops in the mid abdomen with air-fluid levels concerning for small bowel obstruction. There is a paucity of gas in the colon. No free air organomegaly. IMPRESSION: COPD. Airspace opacities in the left lower lobe and right upper lobe concerning for multifocal pneumonia. Dilated  small bowel loops in the mid abdomen concerning for small bowel obstruction. Electronically Signed   By: Rolm Baptise M.D.   On: 02/28/2016 11:48     Scheduled Meds: . aztreonam  2 g Intravenous Q8H  . heparin  5,000 Units Subcutaneous Q8H  . mouth rinse  15 mL Mouth Rinse BID  . pantoprazole (PROTONIX) IV  40 mg Intravenous Q12H  . tiotropium  18 mcg Inhalation Daily  . vancomycin  1,000 mg Intravenous Q12H   Continuous Infusions: . sodium chloride 100 mL/hr at 02/29/16 1054     LOS: 1 day    Time spent: 30 min    Janece Canterbury, MD [facility] Pager [phone]  If 7PM-7AM, please contact night-coverage [email or web address] Password [email or web address] 02/29/2016, 12:50 PM

## 2016-02-29 NOTE — Progress Notes (Signed)
Pt. A-line MAP continues to be <60 when asleep. When awake, MAP is >65. Paged [facility]. Orders received that MAP >50 acceptable and to administer NS 250cc bolus.  Will give bolus and continue to monitor.

## 2016-02-29 NOTE — Evaluation (Signed)
Physical Therapy Evaluation Patient Details Name: Allen Beasley MRN: [id number] DOB: 1923-04-18 Today's Date: 02/29/2016   History of Present Illness  Allen Beasley is a 82 y.o. male with medical history significant of COPD/emphysema, HTN, HMD, aortic insufficiency; who presents 02/28/16 with complaints of abdominal  cramps, Nausea and vomiting. Patient was recently admitted from 10/26-11/7/17 for sepsis and pneumonia.   Clinical Impression  The patient is very weak compared to last admission. Currently requires 2 person assistance. BP after transfer 93/50, oxygen saturation  On 6 liters [place] lowest  At 855 with activity. (@ at rest. Pt admitted with above diagnosis. Pt currently with functional limitations due to the deficits listed below (see PT Problem List).  Pt will benefit from skilled PT to increase their independence and safety with mobility to allow discharge to the venue listed below.   . No family present to Discuss DC plans.     Follow Up Recommendations SNF;Supervision/Assistance - 24 hour    Equipment Recommendations  None recommended by PT    Recommendations for Other Services       Precautions / Restrictions Precautions Precautions: Fall Precaution Comments: on 6 liters [place], Art line      Mobility  Bed Mobility Overal bed mobility: Needs Assistance Bed Mobility: Supine to Sit     Supine to sit: Mod assist     General bed mobility comments: increased time and use of rail  Transfers Overall transfer level: Needs assistance Equipment used: Rolling walker (2 wheeled) Transfers: Sit to/from [facility] Sit to Stand: Mod assist;+2 physical assistance;+2 safety/equipment Stand pivot transfers: +2 safety/equipment;+2 physical assistance       General transfer comment: patient stood with knees buckling immediately. Assisted to return to sitting on bed edge. Stood again, knee flexed, support given to  right knee as patient pivoted to recliner with  shuffling steps. decreased control of cescnet.  Ambulation/Gait                Stairs            Wheelchair Mobility    Modified Rankin (Stroke Patients Only)       Balance Overall balance assessment: Needs assistance Sitting-balance support: Bilateral upper extremity supported;Feet supported Sitting balance-Leahy Scale: Fair     Standing balance support: During functional activity;Bilateral upper extremity supported Standing balance-Leahy Scale: Poor                               Pertinent Vitals/Pain Faces Pain Scale: No hurt    Home Living Family/patient expects to be discharged to:: Private residence Living Arrangements: Alone   Type of Home: House Home Access: Stairs to enter Entrance Stairs-Rails: Psychiatric nurse of Steps: 4 Home Layout: One level Home Equipment: Walker - 2 wheels Additional Comments: difficult to understand patient re: caregivers. Info taken from previous admission.    Prior Function                 Hand Dominance        Extremity/Trunk Assessment   Upper Extremity Assessment: Generalized weakness           Lower Extremity Assessment: Generalized weakness      Cervical / Trunk Assessment: Kyphotic  Communication      Cognition Arousal/Alertness: Awake/alert Behavior During Therapy: WFL for tasks assessed/performed Overall Cognitive Status: Within Functional Limits for tasks assessed  General Comments      Exercises General Exercises - Lower Extremity Ankle Circles/Pumps: AROM;Both;5 reps;Seated Long Arc Quad: AROM;Both;5 reps Straight Leg Raises: Seated Hip Flexion/Marching: AROM;Both;5 reps;Seated   Assessment/Plan    PT Assessment Patient needs continued PT services  PT Problem List Decreased strength;Decreased activity tolerance;Decreased balance;Decreased mobility;Cardiopulmonary status limiting activity;Decreased knowledge of  precautions;Decreased safety awareness          PT Treatment Interventions DME instruction;Gait training;Functional mobility training;Therapeutic activities;Therapeutic exercise;Patient/family education    PT Goals (Current goals can be found in the Care Plan section)  Acute Rehab PT Goals Patient Stated Goal: to get up PT Goal Formulation: With patient Time For Goal Achievement: 03/14/16 Potential to Achieve Goals: Good    Frequency Min 3X/week   Barriers to discharge Decreased caregiver support no family present to discuss    Co-evaluation               End of Session Equipment Utilized During Treatment: Gait belt;Oxygen Activity Tolerance: Patient limited by fatigue Patient left: in chair;with call bell/phone within reach;with chair alarm set Nurse Communication: Mobility status         Time: [id number] PT Time Calculation (min) (ACUTE ONLY): 24 min   Charges:   PT Evaluation $PT Eval Moderate Complexity: 1 Procedure PT Treatments $Therapeutic Activity: 8-22 mins   PT G Codes:        Claretha Cooper 02/29/2016, 10:17 AM Tresa Endo PT [phone]

## 2016-03-01 ENCOUNTER — Other Ambulatory Visit: Payer: Self-pay

## 2016-03-01 ENCOUNTER — Encounter (HOSPITAL_COMMUNITY): Payer: Self-pay | Admitting: General Surgery

## 2016-03-01 ENCOUNTER — Inpatient Hospital Stay (HOSPITAL_COMMUNITY): Payer: Medicare Other

## 2016-03-01 ENCOUNTER — Inpatient Hospital Stay: Payer: Medicare Other | Admitting: Acute Care

## 2016-03-01 DIAGNOSIS — J189 Pneumonia, unspecified organism: Secondary | ICD-10-CM

## 2016-03-01 DIAGNOSIS — J9621 Acute and chronic respiratory failure with hypoxia: Secondary | ICD-10-CM

## 2016-03-01 LAB — BASIC METABOLIC PANEL
Anion gap: 5 (ref 5–15)
BUN: 30 mg/dL — AB (ref 6–20)
CALCIUM: 7.9 mg/dL — AB (ref 8.9–10.3)
CO2: 30 mmol/L (ref 22–32)
CREATININE: 0.6 mg/dL — AB (ref 0.61–1.24)
Chloride: 106 mmol/L (ref 101–111)
Glucose, Bld: 100 mg/dL — ABNORMAL HIGH (ref 65–99)
Potassium: 3.7 mmol/L (ref 3.5–5.1)
SODIUM: 141 mmol/L (ref 135–145)

## 2016-03-01 LAB — CBC
HCT: 36.2 % — ABNORMAL LOW (ref 39.0–52.0)
Hemoglobin: 11.2 g/dL — ABNORMAL LOW (ref 13.0–17.0)
MCH: 28.5 pg (ref 26.0–34.0)
MCHC: 30.9 g/dL (ref 30.0–36.0)
MCV: 92.1 fL (ref 78.0–100.0)
PLATELETS: 139 10*3/uL — AB (ref 150–400)
RBC: 3.93 MIL/uL — ABNORMAL LOW (ref 4.22–5.81)
RDW: 15.2 % (ref 11.5–15.5)
WBC: 10.7 10*3/uL — AB (ref 4.0–10.5)

## 2016-03-01 MED ORDER — LEVALBUTEROL HCL 0.63 MG/3ML IN NEBU
0.6300 mg | INHALATION_SOLUTION | RESPIRATORY_TRACT | Status: DC | PRN
  Administered 2016-03-06: 0.63 mg via RESPIRATORY_TRACT
  Filled 2016-03-01: qty 3

## 2016-03-01 MED ORDER — LEVOFLOXACIN IN D5W 750 MG/150ML IV SOLN
750.0000 mg | INTRAVENOUS | Status: DC
  Administered 2016-03-01 – 2016-03-07 (×8): 750 mg via INTRAVENOUS
  Filled 2016-03-01 (×8): qty 150

## 2016-03-01 MED ORDER — IPRATROPIUM-ALBUTEROL 0.5-2.5 (3) MG/3ML IN SOLN: 3.0000 mL | Freq: Three times a day (TID) | RESPIRATORY_TRACT | Status: DC

## 2016-03-01 MED ORDER — FUROSEMIDE 10 MG/ML IJ SOLN
40.0000 mg | Freq: Once | INTRAMUSCULAR | Status: AC
  Administered 2016-03-01: 40 mg via INTRAVENOUS
  Filled 2016-03-01: qty 4

## 2016-03-01 MED ORDER — LEVALBUTEROL HCL 0.63 MG/3ML IN NEBU
0.6300 mg | INHALATION_SOLUTION | Freq: Four times a day (QID) | RESPIRATORY_TRACT | Status: DC
  Administered 2016-03-01 (×2): 0.63 mg via RESPIRATORY_TRACT
  Filled 2016-03-01 (×2): qty 3

## 2016-03-01 MED ORDER — IPRATROPIUM-ALBUTEROL 0.5-2.5 (3) MG/3ML IN SOLN
3.0000 mL | Freq: Three times a day (TID) | RESPIRATORY_TRACT | Status: DC
  Administered 2016-03-02 – 2016-03-05 (×12): 3 mL via RESPIRATORY_TRACT
  Filled 2016-03-01 (×12): qty 3

## 2016-03-01 NOTE — Consult Note (Signed)
Name: Allen Beasley MRN: [id number] DOB: 01-17-1924    ADMISSION DATE:  02/28/2016 CONSULTATION DATE:  11/20  REFERRING MD :  Dr. Sheran Fava  CHIEF COMPLAINT:  Hypoxia  BRIEF PATIENT DESCRIPTION: 82 year old male recently admitted with necrotizing pneumonia discharged on 6 L with 4 weeks of antibiotics. Was able to come off of oxygen, however, admitted 11/18 with incarcerated hernia. That is now repaired, but he is having worsening oxygen requirements.  SIGNIFICANT EVENTS  11/7 discharged on 6L [place] and Levaquin for necrotizing PNA 11/18 admit and to OR for incarcerated hernia 11/20 O2 demands up. PCCM consult.  STUDIES:     HISTORY OF PRESENT ILLNESS:  82 year old male with past medical history as below, which is significant for COPD (former CY patient), hyperlipidemia, hypertension. He was admitted the end of October 2017 through November 7 with acute on chronic hypoxemic respiratory failure. This was thought to be secondary to a combination of emphysema and aspiration pneumonia. He did require BiPAP briefly and was discharged on 6 L of oxygen via nasal cannula and a ten-day course of Levaquin.. After discharge to home he was able to wean himself to room air and was ambulating comfortably without oxygen. Unfortunately, 02/28/2016 he again presented to [facility] emergency department with complaints of nausea, vomiting, abdominal pain, and constipation for 4 days. He is found have incarcerated hernia and was taken to the OR for repair that same day. 11/20 his abdominal symptoms were improving and his NG tube was removed, however, he was somewhat more short of breath with increased oxygen demand. Repeat chest x-ray concerning for right-sided pneumonia versus pulmonary edema. Fluids were changed to [facility] when he was given 40 mg of IV Lasix. He has significant amount of urine output, but oxygen requirements have not improved much. PCCM consulted.  PAST MEDICAL HISTORY :   has a past  medical history of COPD (chronic obstructive pulmonary disease) ([place]); Erectile dysfunction; History of aortic insufficiency; History of chest pain; Hyperlipidemia; and Hypertension.  has a past surgical history that includes Cataract extraction, bilateral (2001); Lithotripsy (2001); Tonsilectomy, adenoidectomy, bilateral myringotomy and tubes (1932); Total hip arthroplasty (07); Joint replacement; and laparotomy (Right, 02/28/2016). Prior to Admission medications   Medication Sig Start Date End Date Taking? Authorizing Provider  furosemide (LASIX) 20 MG tablet Take 1 tablet (20 mg total) by mouth daily. 02/17/16  Yes Oswald Hillock, MD  guaiFENesin (MUCINEX) 600 MG 12 hr tablet Take 1 tablet (600 mg total) by mouth 2 (two) times daily. 02/17/16  Yes Oswald Hillock, MD  lactobacillus acidophilus & bulgar (LACTINEX) chewable tablet Chew 2 tablets by mouth 3 (three) times daily with meals. 02/17/16  Yes Oswald Hillock, MD  levalbuterol (XOPENEX) 0.63 MG/3ML nebulizer solution Take 3 mLs (0.63 mg total) by nebulization every 4 (four) hours as needed for wheezing or shortness of breath. 02/17/16  Yes Oswald Hillock, MD  magnesium citrate SOLN Take 1 Bottle by mouth once.   Yes Historical Provider, MD  pantoprazole (PROTONIX) 40 MG tablet Take 1 tablet (40 mg total) by mouth daily at 6 (six) AM. 02/18/16  Yes Oswald Hillock, MD  polyethylene glycol (MIRALAX / GLYCOLAX) packet Take 17 g by mouth daily. 02/18/16  Yes Oswald Hillock, MD  simvastatin (ZOCOR) 80 MG tablet Take 80 mg by mouth daily.   Yes Historical Provider, MD  sodium phosphate Pediatric (FLEET) 3.5-9.5 GM/59ML enema Place 1 enema rectally once.   Yes Historical Provider, MD  [facility] Michaell Cowing  18 MCG inhalation capsule PLACE 1 CAPSULE INTO INHALER AND INHALE THE CONTENTS OF 1 CAPSULE ONCE DAILY 04/02/14  Yes Deneise Lever, MD  sucralfate (CARAFATE) 1 GM/10ML suspension Take 10 mLs (1 g total) by mouth 2 (two) times daily. Take at breakfast at bedtime.  TAKE  AT LEAST 2 HOURS APART FROM LEVOFLOXACIN 02/17/16 02/27/17 Yes Oswald Hillock, MD  temazepam (RESTORIL) 15 MG capsule Take 15 mg by mouth at bedtime as needed. 02/19/16  Yes Historical Provider, MD  terazosin (HYTRIN) 2 MG capsule Take 2 mg by mouth at bedtime.     Yes Historical Provider, MD  levofloxacin (LEVAQUIN) 750 MG tablet Take 1 tablet (750 mg total) by mouth daily. Patient not taking: Reported on 02/28/2016 02/17/16   Oswald Hillock, MD  senna-docusate (SENOKOT-S) 8.6-50 MG tablet Take 2 tablets by mouth 2 (two) times daily. Patient not taking: Reported on 02/28/2016 02/17/16   Oswald Hillock, MD   Allergies  Allergen Reactions  . Penicillins Hives and Swelling  . Clindamycin/Lincomycin Rash    FAMILY HISTORY:  family history includes Cancer in his father; Heart attack in his father; Heart disease in his father. SOCIAL HISTORY:  reports that he quit smoking about 14 years ago. His smoking use included Cigarettes. He has a 59.00 pack-year smoking history. He has never used smokeless tobacco. He reports that he drinks alcohol.  REVIEW OF SYSTEMS:     Bolds are positive  Constitutional: weight loss, gain, night sweats, Fevers, chills, fatigue .  HEENT: headaches, Sore throat, sneezing, nasal congestion, post nasal drip, Difficulty swallowing, Tooth/dental problems, visual complaints visual changes, ear ache CV:  chest pain, radiates: ,Orthopnea, PND, swelling in lower extremities, dizziness, palpitations, syncope.  GI  heartburn, indigestion, abdominal pain, nausea, vomiting, diarrhea, change in bowel habits, loss of appetite, bloody stools.  Resp: cough, productive: , hemoptysis, dyspnea, chest pain, pleuritic.  Skin: rash or itching or icterus GU: dysuria, change in color of urine, urgency or frequency. flank pain, hematuria  MS: joint pain or swelling. decreased range of motion  Psych: change in mood or affect. depression or anxiety.  Neuro: difficulty with speech, weakness, numbness,  ataxia    SUBJECTIVE:   VITAL SIGNS: Temp:  [98.3 F (36.8 C)-99.8 F (37.7 C)] 99.8 F (37.7 C) (11/20 1600) Pulse Rate:  [80-107] 88 (11/20 1800) Resp:  [14-38] 28 (11/20 1800) BP: (103-172)/(36-141) 103/42 (11/20 1800) SpO2:  [78 %-100 %] 91 % (11/20 1800) Arterial Line BP: (70-133)/(28-43) 133/43 (11/20 1000) FiO2 (%):  [50 %-55 %] 50 % (11/20 0600)  PHYSICAL EXAMINATION: General:  Pleasant elderly male in NAD Neuro:  Awake, alert, oriented, non-focal HEENT:  [place]/AT, PERRL, no JVD Cardiovascular:  RRR, no MRG Lungs:  [place] R>L Abdomen:  Soft, non-tender, non-distended Musculoskeletal:  No acute deformity or ROM limitation Skin:  Grossly intact   Recent Labs Lab 02/28/16 1058 02/29/16 0500 03/01/16 0806  NA 136 140 141  K 3.9 3.9 3.7  CL 97* 103 106  CO2 29 32 30  BUN 22* 24* 30*  CREATININE 0.83 0.77 0.60*  GLUCOSE 188* 137* 100*    Recent Labs Lab 02/28/16 1058 02/29/16 0500 03/01/16 0806  HGB 13.5 11.0* 11.2*  HCT 40.7 34.7* 36.2*  WBC 19.8* 15.2* 10.7*  PLT 169 138* 139*   Dg Abd 1 View  Result Date: 02/29/2016 CLINICAL DATA:  Small bowel obstruction EXAM: ABDOMEN - 1 VIEW COMPARISON:  CT from yesterday FINDINGS: Improved maximal distention of small bowel loops.  Many loops were fluid-filled previously, limiting sensitivity of radiography. No concerning mass effect or gas collection. The nasogastric tube tip is coiled on itself at the level of the lower esophagus and gastric cardia, likely limiting utility. Atherosclerosis. These results will be called to the ordering clinician or representative by the Radiologist Assistant, and communication documented in the PACS or zVision Dashboard. IMPRESSION: 1. Improved small bowel obstruction pattern after right inguinal hernia repair. 2. Malpositioned nasogastric tube which is coiled on itself at the GE junction, likely limiting effectiveness. Consider advancement and follow-up. Electronically Signed   By: Monte Fantasia M.D.   On: 02/29/2016 08:12   Dg Chest Port 1 View  Result Date: 03/01/2016 CLINICAL DATA:  COPD, hypertension, rales, hypoxia, history pneumonia EXAM: PORTABLE CHEST 1 VIEW COMPARISON:  Portable exam 0750 hours compared to 02/28/2016 FINDINGS: Slightly rotated to the RIGHT. Nasogastric tube extends to at least the distal esophagus, unable to visualize tip due to light technique. Stable heart size. Increased bibasilar opacities particularly RIGHT lower lobe consistent with pneumonia. Persistent RIGHT upper lobe infiltrate. Underlying COPD changes. No pneumothorax. Bones demineralized. IMPRESSION: Persistent RIGHT upper lobe infiltrate with increased bibasilar opacities question pneumonia, especially RIGHT lower lobe. Electronically Signed   By: Lavonia Dana M.D.   On: 03/01/2016 08:12    ASSESSMENT / PLAN:  Acute on chronic hypoxemic respiratory failure: Likely multifactorial in the setting of chronic emphysema, known necrotizing pneumonia, and now chest x-ray demonstrating diffuse right lung opacification in addition to bibasilar opacification. He is known to have bilateral pleural effusions from his last admission. Most likely scenario here is that he suffered recurrent aspiration when vomiting due to his bowel obstruction. However, his a history of aortic insufficiency although echo from October does not demonstrate this. He was diuresed with 40 mg of Lasix by primary team today and work of breathing did improve, however he remains hypoxemic. Currently on nonrebreather with O2 sats 90%. Additionally he was worked up for pulmonary embolism during his last admission which was negative. There does remain some concern for this extent recent hospitalization, surgery, and likeliness that he was sedentary after his discharge. I do not feel that he is currently exacerbating COPD.  Plan: -Scheduled and PRN bronchodilators, no role steroids > DC Spiriva in favor nebs while acutely ill.  -Continue  Levofloxacin, trend PCT, and follow cultures -Have SLP eval to determine appropriate food consistency to prevent further aspiration -May escalate to needing MV, not a great candidate for BiPAP based on vomiting. If felt as though this has resolved would go for -BiPAP, however, he would want to be intubated if needed. -Doppler legs to rule out DVT.  SBO secondary to incarcerated hernia s/p OR reducation 11/18  - per surgery/primary  Georgann Housekeeper, AGACNP-BC [place] [facility] Pager [phone] or [phone]  03/01/2016 9:09 PM

## 2016-03-01 NOTE — Progress Notes (Signed)
Spoke with NP about pt sats staying around 86-87% when on venti mask. Pt has COPD and NP stated that she believed this probably was his baseline at night. Pt is asymptomatic and alert and oriented. Will continue to monitor pt.

## 2016-03-01 NOTE — Progress Notes (Addendum)
PROGRESS NOTE  Allen Beasley  [id number] DOB: April 02, 1924 DOA: 02/28/2016 PCP: Geoffery Lyons, MD  Brief Narrative:  Patient is a 82 year old male with COPD, aortic insufficiency, hypertension, hyperlipidemia, was recently admitted from 10/26-11/7/17 for sepsis and necrotizing pneumonia, was discharged with a 4-week course of Levaquin.  He returned to the ER with nausea, vomiting, abdominal distention, pain, and inability to have a BM.  CT scan ab/p demonstrated an incarcerated direct right inguinal hernia.  He underwent urgent primary repair by Dr. Barry Dienes on 11/18.  Feeling well and wants to eat today, but somewhat Lyndia Bury of breath and coughing.    Assessment & Plan:   Principal Problem:   SBO (small bowel obstruction) Active Problems:   COPD with emphysema (HCC)   HTN (hypertension)   Acute on chronic respiratory failure with hypoxia (HCC)   Esophageal reflux   Multifocal pneumonia    SBO (small bowel obstruction) due to incarcerated right inguinal hernia -  D/c NG and advanced to clears  -  Appreciate general surgery assistance  Active Problems: Acute on chronic respiratory failure due to COPD with emphysema (HCC) and recent pneumonia, worsening dyspnea -  Repeat CXR:  RLL pneumonia vs. Pulmonary edema from IVF -  D/c IVF -  Lasix 40mg  IV once -  IS and sitting upright -  Flutter valve -  Xopenex and Spiriva -  Continue face mask and try to avoid PPV due to SBO   Necrotizing pneumonia, still undergoing treatment from last admission and may have some superimposed aspiration pneumonia -  D/c vanc, aztreonam -  MRSA PCR negative -  Resume fluoroquinolone    HTN (hypertension), BP improved this morning -  D/c IVF -  Continue to hold antihypertensives  Esophageal reflux, stable, continue IV PPI  DVT prophylaxis:  heparin Code Status:  full Family Communication:  Patient alone Disposition Plan:  Pending tolerating diet.  PT recommending SNF   Consultants:    [facility], Dr. Barry Dienes  Procedures:  Primary repair of incarcerated direct right inguinal hernia.    Antimicrobials:  Anti-infectives    Start     Dose/Rate Route Frequency Ordered Stop   02/29/16 0600  ciprofloxacin (CIPRO) IVPB 400 mg  Status:  Discontinued     400 mg 200 mL/hr over 60 Minutes Intravenous On call to O.R. 02/28/16 1731 02/29/16 0603   02/29/16 0200  vancomycin (VANCOCIN) IVPB 1000 mg/200 mL premix     1,000 mg 200 mL/hr over 60 Minutes Intravenous Every 12 hours 02/28/16 1340     02/28/16 2200  aztreonam (AZACTAM) 2 g in dextrose 5 % 50 mL IVPB  Status:  Discontinued     2 g 100 mL/hr over 30 Minutes Intravenous Every 8 hours 02/28/16 1340 02/28/16 1430   02/28/16 2200  aztreonam (AZACTAM) 2 g in dextrose 5 % 50 mL IVPB     2 g 100 mL/hr over 30 Minutes Intravenous Every 8 hours 02/28/16 2140 03/07/16 2159   02/28/16 1330  aztreonam (AZACTAM) 2 g in dextrose 5 % 50 mL IVPB     2 g 100 mL/hr over 30 Minutes Intravenous  Once 02/28/16 1317 02/28/16 1417   02/28/16 1330  vancomycin (VANCOCIN) IVPB 1000 mg/200 mL premix     1,000 mg 200 mL/hr over 60 Minutes Intravenous  Once 02/28/16 1317 02/28/16 1737          Subjective:  Feeling hungry. No abdominal pain.  Passing gas.  Coughing and somewhat SOB this morning.  Required Venti, now NRB this morning.    Objective: Vitals:   03/01/16 0800 03/01/16 0820 03/01/16 0900 03/01/16 1000  BP: (!) 143/64  (!) 131/58 (!) 141/36  Pulse: 97 89 97 (!) 106  Resp: (!) 32 (!) 27 19 (!) 35  Temp: 98.6 F (37 C)     TempSrc: Axillary     SpO2: (!) 78% (!) 88% 91% (!) 89%  Weight:      Height:        Intake/Output Summary (Last 24 hours) at 03/01/16 1057 Last data filed at 03/01/16 0700  Gross per 24 hour  Intake          2535.83 ml  Output              820 ml  Net          1715.83 ml   Filed Weights   02/28/16 1241 02/29/16 0000  Weight: 83.9 kg (185 lb) 85.2 kg (187 lb 13.3 oz)     Examination:  General exam:  Adult male.  tachypneic with audible rhonchi and rales while wearing face mask  HEENT:  NCAT, MMM Respiratory system:  rhonchorous bilateral BS with rales and faint wheeze Cardiovascular system: Regular rate and rhythm, normal S1/S2. No murmurs, rubs, gallops or clicks.  Warm extremities Gastrointestinal system:  Normal active bowel sounds, soft, mildly distended, nontender.  Dressing over right groin with mostly serous drainage MSK:  Normal tone and bulk, no lower extremity edema Neuro:  Grossly moves all extremities    Data Reviewed: I have personally reviewed following labs and imaging studies  CBC:  Recent Labs Lab 02/28/16 1058 02/29/16 0500 03/01/16 0806  WBC 19.8* 15.2* 10.7*  HGB 13.5 11.0* 11.2*  HCT 40.7 34.7* 36.2*  MCV 87.5 91.6 92.1  PLT 169 138* [id number]*   Basic Metabolic Panel:  Recent Labs Lab 02/28/16 1058 02/29/16 0500 03/01/16 0806  NA 136 140 141  K 3.9 3.9 3.7  CL 97* 103 106  CO2 29 32 30  GLUCOSE 188* 137* 100*  BUN 22* 24* 30*  CREATININE 0.83 0.77 0.60*  CALCIUM 9.4 7.9* 7.9*   GFR: Estimated Creatinine Clearance: 71.4 mL/min (by C-G formula based on SCr of 0.6 mg/dL (L)). Liver Function Tests:  Recent Labs Lab 02/28/16 1058  AST 22  ALT 14*  ALKPHOS 51  BILITOT 1.3*  PROT 6.9  ALBUMIN 3.3*   No results for input(s): LIPASE, AMYLASE in the last 168 hours. No results for input(s): AMMONIA in the last 168 hours. Coagulation Profile: No results for input(s): INR, PROTIME in the last 168 hours. Cardiac Enzymes: No results for input(s): CKTOTAL, CKMB, CKMBINDEX, TROPONINI in the last 168 hours. BNP (last 3 results) No results for input(s): PROBNP in the last 8760 hours. HbA1C: No results for input(s): HGBA1C in the last 72 hours. CBG: No results for input(s): GLUCAP in the last 168 hours. Lipid Profile: No results for input(s): CHOL, HDL, LDLCALC, TRIG, CHOLHDL, LDLDIRECT in the last 72  hours. Thyroid Function Tests: No results for input(s): TSH, T4TOTAL, FREET4, T3FREE, THYROIDAB in the last 72 hours. Anemia Panel: No results for input(s): VITAMINB12, FOLATE, FERRITIN, TIBC, IRON, RETICCTPCT in the last 72 hours. Urine analysis:    Component Value Date/Time   COLORURINE AMBER (A) 02/28/2016 1255   APPEARANCEUR CLOUDY (A) 02/28/2016 1255   LABSPEC 1.022 02/28/2016 1255   PHURINE 6.0 02/28/2016 1255   GLUCOSEU NEGATIVE 02/28/2016 1255   HGBUR LARGE (A) 02/28/2016 1255   BILIRUBINUR  SMALL (A) 02/28/2016 1255   KETONESUR NEGATIVE 02/28/2016 1255   PROTEINUR 30 (A) 02/28/2016 1255   UROBILINOGEN 0.2 08/19/2012 1511   NITRITE NEGATIVE 02/28/2016 1255   LEUKOCYTESUR SMALL (A) 02/28/2016 1255   Sepsis Labs: @LABRCNTIP (procalcitonin:4,lacticidven:4)  ) Recent Results (from the past 240 hour(s))  Blood culture (routine x 2)     Status: None (Preliminary result)   Collection Time: 02/28/16 12:29 PM  Result Value Ref Range Status   Specimen Description BLOOD BLOOD LEFT FOREARM  Final   Special Requests BOTTLES DRAWN AEROBIC AND ANAEROBIC 5CC  Final   Culture   Final    NO GROWTH < 24 HOURS Performed at [facility]    Report Status PENDING  Incomplete  Blood culture (routine x 2)     Status: None (Preliminary result)   Collection Time: 02/28/16 12:39 PM  Result Value Ref Range Status   Specimen Description BLOOD BLOOD RIGHT FOREARM  Final   Special Requests BOTTLES DRAWN AEROBIC AND ANAEROBIC 5CC  Final   Culture   Final    NO GROWTH < 24 HOURS Performed at [facility]    Report Status PENDING  Incomplete  MRSA PCR Screening     Status: None   Collection Time: 02/28/16 10:44 PM  Result Value Ref Range Status   MRSA by PCR NEGATIVE NEGATIVE Final    Comment:        The GeneXpert MRSA Assay (FDA approved for NASAL specimens only), is one component of a comprehensive MRSA colonization surveillance program. It is not intended to diagnose  MRSA infection nor to guide or monitor treatment for MRSA infections.       Radiology Studies: Dg Abd 1 View  Result Date: 02/29/2016 CLINICAL DATA:  Small bowel obstruction EXAM: ABDOMEN - 1 VIEW COMPARISON:  CT from yesterday FINDINGS: Improved maximal distention of small bowel loops. Many loops were fluid-filled previously, limiting sensitivity of radiography. No concerning mass effect or gas collection. The nasogastric tube tip is coiled on itself at the level of the lower esophagus and gastric cardia, likely limiting utility. Atherosclerosis. These results will be called to the ordering clinician or representative by the Radiologist Assistant, and communication documented in the PACS or zVision Dashboard. IMPRESSION: 1. Improved small bowel obstruction pattern after right inguinal hernia repair. 2. Malpositioned nasogastric tube which is coiled on itself at the GE junction, likely limiting effectiveness. Consider advancement and follow-up. Electronically Signed   By: Monte Fantasia M.D.   On: 02/29/2016 08:12   Ct Abdomen Pelvis W Contrast  Result Date: 02/28/2016 CLINICAL DATA:  Abdominal pain with nausea and vomiting EXAM: CT ABDOMEN AND PELVIS WITH CONTRAST TECHNIQUE: Multidetector CT imaging of the abdomen and pelvis was performed using the standard protocol following bolus administration of intravenous contrast. CONTRAST:  11mL ISOVUE-300 IOPAMIDOL (ISOVUE-300) INJECTION 61% COMPARISON:  CT abdomen and pelvis February 06, 2016 ; abdominal radiographic series February 28, 2016 FINDINGS: Lower chest: There is airspace consolidation throughout much of the left lower lobe and inferior lingula with moderate pleural effusion on the left. There is patchy consolidation in the medial and posterior segments of the right lower lobe. There is atelectatic change in the right middle lobe. There is a focal stable nodular opacity in the lateral segment of the right lower lobe measuring 8 x 6 mm. There are  multiple foci of coronary artery calcification. There is a hiatal hernia. Hepatobiliary: No focal liver lesions are evident. Gallbladder wall is not appreciably thickened. There is  no biliary duct dilatation. Pancreas: There is no pancreatic mass or inflammatory focus. Spleen: No splenic lesions are evident. Adrenals/Urinary Tract: The right adrenal appears normal. There is an apparent adenoma in the left adrenal measuring 1.2 x 1.2 cm. Kidneys bilaterally show no mass or hydronephrosis on either side. There is moderate prominence of renal sinus fat bilaterally. There is no renal or ureteral calculus bilaterally. Urinary bladder is midline with overall wall thickness within normal limits. There is a diverticulum arising from the rightward aspect of the urinary bladder measuring 3.4 x 2.2 cm. Note also that there is mild irregular thickening along the posterior aspect of the urinary bladder wall. Stomach/Bowel: There is proximal small bowel obstruction which extends to the level of a right inguinal hernia. Bowel extends into this hernia. A transition zone is noted at the level of the right inguinal hernia indicative of a degree of bowel obstruction. This appearance suggests that there may be a degree of incarceration in this right inguinal hernia. There is no appreciable bowel wall or mesenteric thickening. Nasogastric tube tip is in the stomach. Stomach is filled with fluid and air. No free air or portal venous air is evident. No bowel pneumatosis evident. Vascular/Lymphatic: There is extensive atherosclerotic calcification throughout the aorta and major pelvic arterial vessels extending to the proximal thigh regions. There is dilatation of the distal abdominal aorta with a maximum transverse diameter of 3.0 x 3.0 cm. There is no periaortic fluid. There is calcification at the origins of the major mesenteric vessels. There is no appreciable adenopathy in the abdomen or pelvis. Reproductive: Prostate is prominent,  containing multiple calcifications. There is no pelvic mass outside of the prostate and potential urinary bladder. No pelvic fluid collection evident. Other: Appendix appears unremarkable. No abscess or ascites evident in the abdomen or pelvis. In addition to the apparent incarcerated right inguinal hernia. There is fat in the left inguinal ring. Musculoskeletal: There is extensive lumbar spine osteoarthritic change. There is spinal stenosis at L2-3, L3-4, L4-5, and L5-S1 due to extensive bony hypertrophy and diffuse disc protrusion. There are no blastic or lytic bone lesions. There is no intramuscular or abdominal wall lesion. Note that the patient has had a previous total hip replacement right. There is moderate osteoarthritic change in the left hip joint. IMPRESSION: Bowel obstruction at the level of a right inguinal hernia. Suspect a degree of incarceration in this hernia. Transition zone noted at the level of this hernia near the jejunoileal junction. No free air. Extensive airspace consolidation in the inferior lingula and left lower lobe with fairly small left pleural effusion. There is also patchy infiltrate in the posterior and medial segments of the right lower lobe. 7 x 5 mm nodular opacity right lower lobe. Non-contrast chest CT at 6-12 months is recommended. If the nodule is stable at time of repeat CT, then future CT at 18-24 months (from today's scan) is considered optional for low-risk patients, but is recommended for high-risk patients. This recommendation follows the consensus statement: Guidelines for Management of Incidental Pulmonary Nodules Detected on CT Images: From the Fleischner Society 2017; Radiology 2017; 284:228-243. Mild irregularity along the urinary bladder wall posteriorly raises concern for potential inflammation or early neoplasm. This finding may well warrant direct visualization/cystoscopy to further evaluate. There is a rightward located urinary bladder diverticulum. No renal  or ureteral calculus. No hydronephrosis. Prominent prostate with multiple prostatic calculi. Advise correlation with PSA. Small left adrenal adenoma. Extensive atherosclerotic calcification in the aorta and essentially all abdominal  and pelvic arterial vessels as well as coronary artery calcification. Mild dilatation of the distal aorta. Recommend followup by ultrasound in 3 years. This recommendation follows ACR consensus guidelines: White Paper of the ACR Incidental Findings Committee II on Vascular Findings. J Am Coll Radiol 2013; 10:789-794. Multilevel spinal stenosis, multifactorial. Small hiatal hernia. Stomach filled with fluid and air. Nasogastric tube tip in stomach. Critical Value/emergent results were called by telephone at the time of interpretation on 02/28/2016 at 3:05 pm to Dr. Lajean Saver , who verbally acknowledged these results. Electronically Signed   By: Lowella Grip III M.D.   On: 02/28/2016 15:05   Dg Chest Port 1 View  Result Date: 03/01/2016 CLINICAL DATA:  COPD, hypertension, rales, hypoxia, history pneumonia EXAM: PORTABLE CHEST 1 VIEW COMPARISON:  Portable exam 0750 hours compared to 02/28/2016 FINDINGS: Slightly rotated to the RIGHT. Nasogastric tube extends to at least the distal esophagus, unable to visualize tip due to light technique. Stable heart size. Increased bibasilar opacities particularly RIGHT lower lobe consistent with pneumonia. Persistent RIGHT upper lobe infiltrate. Underlying COPD changes. No pneumothorax. Bones demineralized. IMPRESSION: Persistent RIGHT upper lobe infiltrate with increased bibasilar opacities question pneumonia, especially RIGHT lower lobe. Electronically Signed   By: Lavonia Dana M.D.   On: 03/01/2016 08:12   Dg Abd Acute W/chest  Result Date: 02/28/2016 CLINICAL DATA:  Constipation.  COPD. EXAM: DG ABDOMEN ACUTE W/ 1V CHEST COMPARISON:  02/11/2016 FINDINGS: New patchy airspace opacity in the right upper lobe. Continued opacity at the  left lung base. Underlying COPD. Heart is normal size. No effusions. Dilated small bowel loops in the mid abdomen with air-fluid levels concerning for small bowel obstruction. There is a paucity of gas in the colon. No free air organomegaly. IMPRESSION: COPD. Airspace opacities in the left lower lobe and right upper lobe concerning for multifocal pneumonia. Dilated small bowel loops in the mid abdomen concerning for small bowel obstruction. Electronically Signed   By: Rolm Baptise M.D.   On: 02/28/2016 11:48     Scheduled Meds: . aztreonam  2 g Intravenous Q8H  . furosemide  40 mg Intravenous Once  . heparin  5,000 Units Subcutaneous Q8H  . mouth rinse  15 mL Mouth Rinse BID  . pantoprazole (PROTONIX) IV  40 mg Intravenous Q12H  . tiotropium  18 mcg Inhalation Daily  . vancomycin  1,000 mg Intravenous Q12H   Continuous Infusions:    LOS: 2 days    Time spent: 30 min    Janece Canterbury, MD [facility] Pager [phone]  If 7PM-7AM, please contact night-coverage [email or web address] Password [facility] 03/01/2016, 10:57 AM

## 2016-03-01 NOTE — Patient Outreach (Signed)
Allen Beasley) Care Management  03/01/2016  Allen Beasley 11-29-1923 [id number]   Attempted telephone call to patient. Unable to reach. HIPAA compliant voice message left with call back phone number.  Upon chart review, patient noted to be inpatient for incarcerated inquinal hernia right.  PLAN:  RNCM will notify [facility] Beasley care management hospital liaison of patients admission.   Quinn Plowman RN,BSN,CCM [facility] Telephonic  [phone]

## 2016-03-01 NOTE — Clinical Social Work Placement (Signed)
   CLINICAL SOCIAL WORK PLACEMENT  NOTE  Date:  03/01/2016  Patient Details  Name: Allen Beasley MRN: [id number] Date of Birth: [birth year]  Clinical Social Work is seeking post-discharge placement for this patient at the [place] level of care (*CSW will initial, date and re-position this form in  chart as items are completed):  Yes   Patient/family provided with [place] Work Department's list of facilities offering this level of care within the geographic area requested by the patient (or if unable, by the patient's family).  Yes   Patient/family informed of their freedom to choose among providers that offer the needed level of care, that participate in Medicare, Medicaid or managed care program needed by the patient, have an available bed and are willing to accept the patient.  Yes   Patient/family informed of [place]'s ownership interest in [facility] and [facility], as well as of the fact that they are under no obligation to receive care at these facilities.  PASRR submitted to EDS on       PASRR number received on       Existing PASRR number confirmed on 03/01/16     FL2 transmitted to all facilities in geographic area requested by pt/family on 03/01/16     FL2 transmitted to all facilities within larger geographic area on       Patient informed that his/her managed care company has contracts with or will negotiate with certain facilities, including the following:            Patient/family informed of bed offers received.  Patient chooses bed at       Physician recommends and patient chooses bed at      Patient to be transferred to   on  .  Patient to be transferred to facility by       Patient family notified on   of transfer.  Name of family member notified:        PHYSICIAN       Additional Comment:    _______________________________________________ Luretha Rued, [place] 03/01/2016, 2:45  PM

## 2016-03-01 NOTE — Progress Notes (Signed)
CSW assisting with d/c planning. Pt recently hospitalized at [facility] and d/c home ( 02/04/16 - 02/17/16 ). Please see psychosocial assessment completed on 02/16/16. Pt was hospitalized from home on 02/28/16 with a right incarcerated inguinal Hernia. Surgery has been completed. PT has recommended SNF at d/c. CSW has met with pt / niece [name] [phone] to assist with d/c planning. Pt / niece are in agreement with SNF at d/c and SNF search has been initiated. Bed offers are pending. CSW will review offers with pt / niece once available and continue assisting with d/c planning.  Werner Lean LCSW [phone]

## 2016-03-01 NOTE — Progress Notes (Signed)
[facility]  Patient Status: Active (receiving services up to time of hospitalization)  [facility] is providing the following services: RN, PT, OT and MSW  If patient discharges after hours, please call [phone].   Allen Beasley 03/01/2016, 12:50 PM

## 2016-03-01 NOTE — Progress Notes (Signed)
Pharmacy Antibiotic Note  Allen Beasley is a 82 y.o. male admitted on 02/28/2016 with pneumonia.  PMH significant for a recent admission (10/26-11/7/17) for sepsis/PNA.  He was treated with course of Clindamycin/Flagyl that was changed to Levaquin when he developed rash on Clindamycin.  He presents today with abdominal pain, nausea, vomiting.  CXR+ PNA.  Plan broad antibiotic coverage given recent history..  Pharmacy has been consulted for Aztreonam & Vancomycin dosing. PCN allergy noted.  Patient has no history of receiving cephalosporins.    Today, 03/01/2016:  CXR shows persistent RUL infiltrate w/ increased bibasilar opacities; per [facility] MD, RLL pneumonia vs. Pulmonary edema from IVF  To transition to [place] today  Still having occasional low O2 sats and tachypnea  Cultures remain clear  Plan:  Levaquin 750 mg IV q24 hr  Monitor renal function and cx data   Height: 6\' 2"  (188 cm) Weight: 187 lb 13.3 oz (85.2 kg) IBW/kg (Calculated) : 82.2  Temp (24hrs), Avg:98.6 F (37 C), Min:98.3 F (36.8 C), Max:98.9 F (37.2 C)   Recent Labs Lab 02/28/16 1058 02/28/16 1250 02/29/16 0500 03/01/16 0806  WBC 19.8*  --  15.2* 10.7*  CREATININE 0.83  --  0.77 0.60*  LATICACIDVEN  --  1.50  --   --     Estimated Creatinine Clearance: 71.4 mL/min (by C-G formula based on SCr of 0.6 mg/dL (L)).    Allergies  Allergen Reactions  . Penicillins Hives and Swelling  . Clindamycin/Lincomycin Rash    Antimicrobials this admission: 11/18 Aztreonam >> 11/20 11/18 Vanc >> 11/20 11/20 Levaquin >>   Dose adjustments this admission:   Microbiology results: 11/18 BCx: ng2d 11/18 urine: reincubated 11/18 MRSA PCR: negative 11/18 Strep Pneumo: negative  Thank you for allowing pharmacy to be a part of this patient's care.  Reuel Boom, PharmD, BCPS Pager: [phone] 03/01/2016, 1:07 PM

## 2016-03-01 NOTE — Progress Notes (Signed)
2 Days Post-Op  Subjective: No flatus. Spent time in chair yesterday. Wants to eat. Nothing out of NG  Objective: Vital signs in last 24 hours: Temp:  [98.3 F (36.8 C)-98.9 F (37.2 C)] 98.6 F (37 C) (11/20 0800) Pulse Rate:  [78-97] 97 (11/20 0900) Resp:  [11-32] 19 (11/20 0900) BP: (70-143)/(20-64) 131/58 (11/20 0900) SpO2:  [78 %-100 %] 91 % (11/20 0900) Arterial Line BP: (70-144)/(20-47) 117/34 (11/20 0900) FiO2 (%):  [50 %-55 %] 50 % (11/20 0600) Last BM Date: 02/29/16  Intake/Output from previous day: 11/19 0701 - 11/20 0700 In: 2873.3 [I.V.:2323.3; IV Piggyback:550] Out: 920 [Urine:920] Intake/Output this shift: No intake/output data recorded.  Alert, nad Soft, not really distended; 'doughy'; nontender. +BS  Lab Results:   Recent Labs  02/29/16 0500 03/01/16 0806  WBC 15.2* 10.7*  HGB 11.0* 11.2*  HCT 34.7* 36.2*  PLT 138* 139*   BMET  Recent Labs  02/29/16 0500 03/01/16 0806  NA 140 141  K 3.9 3.7  CL 103 106  CO2 32 30  GLUCOSE 137* 100*  BUN 24* 30*  CREATININE 0.77 0.60*  CALCIUM 7.9* 7.9*   PT/INR No results for input(s): LABPROT, INR in the last 72 hours. ABG  Recent Labs  02/28/16 2025  PHART 7.417  HCO3 33.0*    Studies/Results: Dg Abd 1 View  Result Date: 02/29/2016 CLINICAL DATA:  Small bowel obstruction EXAM: ABDOMEN - 1 VIEW COMPARISON:  CT from yesterday FINDINGS: Improved maximal distention of small bowel loops. Many loops were fluid-filled previously, limiting sensitivity of radiography. No concerning mass effect or gas collection. The nasogastric tube tip is coiled on itself at the level of the lower esophagus and gastric cardia, likely limiting utility. Atherosclerosis. These results will be called to the ordering clinician or representative by the Radiologist Assistant, and communication documented in the PACS or zVision Dashboard. IMPRESSION: 1. Improved small bowel obstruction pattern after right inguinal hernia repair.  2. Malpositioned nasogastric tube which is coiled on itself at the GE junction, likely limiting effectiveness. Consider advancement and follow-up. Electronically Signed   By: Monte Fantasia M.D.   On: 02/29/2016 08:12   Ct Abdomen Pelvis W Contrast  Result Date: 02/28/2016 CLINICAL DATA:  Abdominal pain with nausea and vomiting EXAM: CT ABDOMEN AND PELVIS WITH CONTRAST TECHNIQUE: Multidetector CT imaging of the abdomen and pelvis was performed using the standard protocol following bolus administration of intravenous contrast. CONTRAST:  69mL ISOVUE-300 IOPAMIDOL (ISOVUE-300) INJECTION 61% COMPARISON:  CT abdomen and pelvis February 06, 2016 ; abdominal radiographic series February 28, 2016 FINDINGS: Lower chest: There is airspace consolidation throughout much of the left lower lobe and inferior lingula with moderate pleural effusion on the left. There is patchy consolidation in the medial and posterior segments of the right lower lobe. There is atelectatic change in the right middle lobe. There is a focal stable nodular opacity in the lateral segment of the right lower lobe measuring 8 x 6 mm. There are multiple foci of coronary artery calcification. There is a hiatal hernia. Hepatobiliary: No focal liver lesions are evident. Gallbladder wall is not appreciably thickened. There is no biliary duct dilatation. Pancreas: There is no pancreatic mass or inflammatory focus. Spleen: No splenic lesions are evident. Adrenals/Urinary Tract: The right adrenal appears normal. There is an apparent adenoma in the left adrenal measuring 1.2 x 1.2 cm. Kidneys bilaterally show no mass or hydronephrosis on either side. There is moderate prominence of renal sinus fat bilaterally. There is no renal  or ureteral calculus bilaterally. Urinary bladder is midline with overall wall thickness within normal limits. There is a diverticulum arising from the rightward aspect of the urinary bladder measuring 3.4 x 2.2 cm. Note also that there  is mild irregular thickening along the posterior aspect of the urinary bladder wall. Stomach/Bowel: There is proximal small bowel obstruction which extends to the level of a right inguinal hernia. Bowel extends into this hernia. A transition zone is noted at the level of the right inguinal hernia indicative of a degree of bowel obstruction. This appearance suggests that there may be a degree of incarceration in this right inguinal hernia. There is no appreciable bowel wall or mesenteric thickening. Nasogastric tube tip is in the stomach. Stomach is filled with fluid and air. No free air or portal venous air is evident. No bowel pneumatosis evident. Vascular/Lymphatic: There is extensive atherosclerotic calcification throughout the aorta and major pelvic arterial vessels extending to the proximal thigh regions. There is dilatation of the distal abdominal aorta with a maximum transverse diameter of 3.0 x 3.0 cm. There is no periaortic fluid. There is calcification at the origins of the major mesenteric vessels. There is no appreciable adenopathy in the abdomen or pelvis. Reproductive: Prostate is prominent, containing multiple calcifications. There is no pelvic mass outside of the prostate and potential urinary bladder. No pelvic fluid collection evident. Other: Appendix appears unremarkable. No abscess or ascites evident in the abdomen or pelvis. In addition to the apparent incarcerated right inguinal hernia. There is fat in the left inguinal ring. Musculoskeletal: There is extensive lumbar spine osteoarthritic change. There is spinal stenosis at L2-3, L3-4, L4-5, and L5-S1 due to extensive bony hypertrophy and diffuse disc protrusion. There are no blastic or lytic bone lesions. There is no intramuscular or abdominal wall lesion. Note that the patient has had a previous total hip replacement right. There is moderate osteoarthritic change in the left hip joint. IMPRESSION: Bowel obstruction at the level of a right  inguinal hernia. Suspect a degree of incarceration in this hernia. Transition zone noted at the level of this hernia near the jejunoileal junction. No free air. Extensive airspace consolidation in the inferior lingula and left lower lobe with fairly small left pleural effusion. There is also patchy infiltrate in the posterior and medial segments of the right lower lobe. 7 x 5 mm nodular opacity right lower lobe. Non-contrast chest CT at 6-12 months is recommended. If the nodule is stable at time of repeat CT, then future CT at 18-24 months (from today's scan) is considered optional for low-risk patients, but is recommended for high-risk patients. This recommendation follows the consensus statement: Guidelines for Management of Incidental Pulmonary Nodules Detected on CT Images: From the Fleischner Society 2017; Radiology 2017; 284:228-243. Mild irregularity along the urinary bladder wall posteriorly raises concern for potential inflammation or early neoplasm. This finding may well warrant direct visualization/cystoscopy to further evaluate. There is a rightward located urinary bladder diverticulum. No renal or ureteral calculus. No hydronephrosis. Prominent prostate with multiple prostatic calculi. Advise correlation with PSA. Small left adrenal adenoma. Extensive atherosclerotic calcification in the aorta and essentially all abdominal and pelvic arterial vessels as well as coronary artery calcification. Mild dilatation of the distal aorta. Recommend followup by ultrasound in 3 years. This recommendation follows ACR consensus guidelines: White Paper of the ACR Incidental Findings Committee II on Vascular Findings. J Am Coll Radiol 2013; 10:789-794. Multilevel spinal stenosis, multifactorial. Small hiatal hernia. Stomach filled with fluid and air. Nasogastric tube tip  in stomach. Critical Value/emergent results were called by telephone at the time of interpretation on 02/28/2016 at 3:05 pm to Dr. Lajean Saver , who  verbally acknowledged these results. Electronically Signed   By: Lowella Grip III M.D.   On: 02/28/2016 15:05   Dg Chest Port 1 View  Result Date: 03/01/2016 CLINICAL DATA:  COPD, hypertension, rales, hypoxia, history pneumonia EXAM: PORTABLE CHEST 1 VIEW COMPARISON:  Portable exam 0750 hours compared to 02/28/2016 FINDINGS: Slightly rotated to the RIGHT. Nasogastric tube extends to at least the distal esophagus, unable to visualize tip due to light technique. Stable heart size. Increased bibasilar opacities particularly RIGHT lower lobe consistent with pneumonia. Persistent RIGHT upper lobe infiltrate. Underlying COPD changes. No pneumothorax. Bones demineralized. IMPRESSION: Persistent RIGHT upper lobe infiltrate with increased bibasilar opacities question pneumonia, especially RIGHT lower lobe. Electronically Signed   By: Lavonia Dana M.D.   On: 03/01/2016 08:12   Dg Abd Acute W/chest  Result Date: 02/28/2016 CLINICAL DATA:  Constipation.  COPD. EXAM: DG ABDOMEN ACUTE W/ 1V CHEST COMPARISON:  02/11/2016 FINDINGS: New patchy airspace opacity in the right upper lobe. Continued opacity at the left lung base. Underlying COPD. Heart is normal size. No effusions. Dilated small bowel loops in the mid abdomen with air-fluid levels concerning for small bowel obstruction. There is a paucity of gas in the colon. No free air organomegaly. IMPRESSION: COPD. Airspace opacities in the left lower lobe and right upper lobe concerning for multifocal pneumonia. Dilated small bowel loops in the mid abdomen concerning for small bowel obstruction. Electronically Signed   By: Rolm Baptise M.D.   On: 02/28/2016 11:48    Anti-infectives: Anti-infectives    Start     Dose/Rate Route Frequency Ordered Stop   02/29/16 0600  ciprofloxacin (CIPRO) IVPB 400 mg  Status:  Discontinued     400 mg 200 mL/hr over 60 Minutes Intravenous On call to O.R. 02/28/16 1731 02/29/16 0603   02/29/16 0200  vancomycin (VANCOCIN) IVPB  1000 mg/200 mL premix     1,000 mg 200 mL/hr over 60 Minutes Intravenous Every 12 hours 02/28/16 1340     02/28/16 2200  aztreonam (AZACTAM) 2 g in dextrose 5 % 50 mL IVPB  Status:  Discontinued     2 g 100 mL/hr over 30 Minutes Intravenous Every 8 hours 02/28/16 1340 02/28/16 1430   02/28/16 2200  aztreonam (AZACTAM) 2 g in dextrose 5 % 50 mL IVPB     2 g 100 mL/hr over 30 Minutes Intravenous Every 8 hours 02/28/16 2140 03/07/16 2159   02/28/16 1330  aztreonam (AZACTAM) 2 g in dextrose 5 % 50 mL IVPB     2 g 100 mL/hr over 30 Minutes Intravenous  Once 02/28/16 1317 02/28/16 1417   02/28/16 1330  vancomycin (VANCOCIN) IVPB 1000 mg/200 mL premix     1,000 mg 200 mL/hr over 60 Minutes Intravenous  Once 02/28/16 1317 02/28/16 1737      Assessment/Plan: s/p Procedure(s): RIGHT INCARCERATED INGUINAL HERNIA, (Right)  D/c ng tube  Clears as tolerated pulm toilet IS PT Can d/c a line from my perspective abx for PNA per medicine Updated family at [facility]. Redmond Pulling, MD, FACS General, Bariatric, & Minimally Invasive Surgery [facility], [place]   LOS: 2 days    Allen Beasley 03/01/2016

## 2016-03-01 NOTE — NC FL2 (Signed)
[place] LEVEL OF CARE SCREENING TOOL     IDENTIFICATION  Patient Name: Allen Beasley Birthdate: 10/18/1923 Sex: male Admission Date (Current Location): 02/28/2016  [facility] and [place] Number:  Herbalist and Address:  [facility],  [place] [address], [place]      Provider Number: [id number]  Attending Physician Name and Address:  Janece Canterbury, MD  Relative Name and Phone Number:       Current Level of Care: Hospital Recommended Level of Care: [place] Prior Approval Number:    Date Approved/Denied:   PASRR Number: [id number]  Discharge Plan: SNF    Current Diagnoses: Patient Active Problem List   Diagnosis Date Noted  . SBO (small bowel obstruction) 02/28/2016  . Multifocal pneumonia 02/28/2016  . Necrotizing pneumonia ([place])   . Hypoxia   . Esophageal reflux   . Goals of care, counseling/discussion   . Palliative care encounter   . Acute on chronic respiratory failure with hypoxia ([place])   . Sepsis ([place]) 02/05/2016  . Acute respiratory failure with hypoxia ([place]) 02/05/2016  . BPH (benign prostatic hyperplasia) 02/05/2016  . Hyperglycemia 02/05/2016  . Thyroid nodule 09/15/2013  . CAP (community acquired pneumonia) 08/19/2012  . HTN (hypertension) 08/05/2010  . Erectile dysfunction   . History of chest pain   . History of aortic insufficiency   . HYPERLIPIDEMIA 01/09/2010  . COPD with emphysema ([place]) 06/19/2007    Orientation RESPIRATION BLADDER Height & Weight     Self, Time, Situation, Place  O2 Indwelling catheter Weight: 187 lb 13.3 oz (85.2 kg) Height:  6\' 2"  (188 cm)  BEHAVIORAL SYMPTOMS/MOOD NEUROLOGICAL BOWEL NUTRITION STATUS  Other (Comment) (no behaviors)   Continent Diet  AMBULATORY STATUS COMMUNICATION OF NEEDS Skin   Extensive Assist Verbally Surgical wounds                       Personal Care Assistance Level of Assistance  Bathing, Feeding, Dressing Bathing  Assistance: Limited assistance Feeding assistance: Independent Dressing Assistance: Limited assistance     Functional Limitations Info  Sight, Hearing, Speech Sight Info: Adequate Hearing Info: Adequate Speech Info: Adequate    SPECIAL CARE FACTORS FREQUENCY  PT (By licensed PT), OT (By licensed OT)     PT Frequency: 5x wk OT Frequency: 5x wk            Contractures Contractures Info: Not present    Additional Factors Info  Code Status Code Status Info: Full Code Allergies Info: PENICILLINS           Current Medications (03/01/2016):  This is the current hospital active medication list Current Facility-Administered Medications  Medication Dose Route Frequency Provider Last Rate Last Dose  . heparin injection 5,000 Units  5,000 Units Subcutaneous Q8H Ripudeep Krystal Eaton, MD   5,000 Units at 03/01/16 1348  . HYDROmorphone (DILAUDID) injection 0.5 mg  0.5 mg Intravenous Q4H PRN Stark Klein, MD   0.5 mg at 02/29/16 0833  . levalbuterol (XOPENEX) nebulizer solution 0.63 mg  0.63 mg Nebulization Q6H Janece Canterbury, MD      . levofloxacin (LEVAQUIN) IVPB 750 mg  750 mg Intravenous Q24H Cindie Laroche Wofford, RPH   750 mg at 03/01/16 1348  . MEDLINE mouth rinse  15 mL Mouth Rinse BID Ripudeep K Rai, MD   15 mL at 03/01/16 1000  . pantoprazole (PROTONIX) injection 40 mg  40 mg Intravenous Q12H Ripudeep Krystal Eaton, MD  40 mg at 03/01/16 0932  . tiotropium (SPIRIVA) inhalation capsule 18 mcg  18 mcg Inhalation Daily Ripudeep Krystal Eaton, MD   18 mcg at 03/01/16 [id number]     Discharge Medications: Please see discharge summary for a list of discharge medications.  Relevant Imaging Results:  Relevant Lab Results:   Additional Information SSN: [id number]  [place] Behlke, Randall An, LCSW

## 2016-03-01 NOTE — Progress Notes (Signed)
Patient received lasix 40mg  IV once this morning and responded well, but still requiring NRB to maintain O2 sat in his 80s, low 90s.  Will administer second dose of IV lasix and have called PCCM consultation for assistance in management.  Will try to avoid bipap due to recent abdominal surgery.

## 2016-03-02 ENCOUNTER — Inpatient Hospital Stay (HOSPITAL_COMMUNITY): Payer: Medicare Other

## 2016-03-02 DIAGNOSIS — J439 Emphysema, unspecified: Secondary | ICD-10-CM

## 2016-03-02 DIAGNOSIS — R0602 Shortness of breath: Secondary | ICD-10-CM

## 2016-03-02 LAB — CBC
HEMATOCRIT: 34.2 % — AB (ref 39.0–52.0)
HEMOGLOBIN: 10.5 g/dL — AB (ref 13.0–17.0)
MCH: 28.1 pg (ref 26.0–34.0)
MCHC: 30.7 g/dL (ref 30.0–36.0)
MCV: 91.4 fL (ref 78.0–100.0)
Platelets: 147 10*3/uL — ABNORMAL LOW (ref 150–400)
RBC: 3.74 MIL/uL — ABNORMAL LOW (ref 4.22–5.81)
RDW: 15 % (ref 11.5–15.5)
WBC: 8.2 10*3/uL (ref 4.0–10.5)

## 2016-03-02 LAB — EXPECTORATED SPUTUM ASSESSMENT W REFEX TO RESP CULTURE

## 2016-03-02 LAB — BASIC METABOLIC PANEL
ANION GAP: 6 (ref 5–15)
BUN: 25 mg/dL — ABNORMAL HIGH (ref 6–20)
CO2: 35 mmol/L — ABNORMAL HIGH (ref 22–32)
Calcium: 7.9 mg/dL — ABNORMAL LOW (ref 8.9–10.3)
Chloride: 99 mmol/L — ABNORMAL LOW (ref 101–111)
Creatinine, Ser: 0.69 mg/dL (ref 0.61–1.24)
GFR calc Af Amer: >60 mL/min (ref >60)
Glucose, Bld: 115 mg/dL — ABNORMAL HIGH (ref 65–99)
POTASSIUM: 3.2 mmol/L — AB (ref 3.5–5.1)
SODIUM: 140 mmol/L (ref 135–145)

## 2016-03-02 LAB — URINE CULTURE

## 2016-03-02 LAB — EXPECTORATED SPUTUM ASSESSMENT W GRAM STAIN, RFLX TO RESP C

## 2016-03-02 MED ORDER — FUROSEMIDE 10 MG/ML IJ SOLN
40.0000 mg | Freq: Once | INTRAMUSCULAR | Status: AC
  Administered 2016-03-02: 40 mg via INTRAVENOUS
  Filled 2016-03-02: qty 4

## 2016-03-02 MED ORDER — POTASSIUM CHLORIDE CRYS ER 20 MEQ PO TBCR: 40.0000 meq | EXTENDED_RELEASE_TABLET | Freq: Once | ORAL | Status: DC

## 2016-03-02 MED ORDER — SODIUM CHLORIDE 0.9 % IV SOLN
30.0000 meq | Freq: Once | INTRAVENOUS | Status: AC
  Administered 2016-03-02: 30 meq via INTRAVENOUS
  Filled 2016-03-02 (×2): qty 15

## 2016-03-02 MED ORDER — SODIUM CHLORIDE 0.9 % IV BOLUS (SEPSIS): 1000.0000 mL | Freq: Once | INTRAVENOUS | Status: DC

## 2016-03-02 NOTE — Progress Notes (Signed)
Upon admission, found full bag of levaquin scheduled for 1400 hanging in pt's room spiked, but not hooked into IV. Called pharmacy who sent up new bag. Levaquin administered at 2100. Will continue to monitor pt closely.

## 2016-03-02 NOTE — Consult Note (Signed)
   [facility] Inpatient Consult   03/02/2016  Allen Beasley 11-Feb-1924 [id number]   Allen Beasley is active with [place] [facility] Telephonic [facility] RNCM for disease management. He is currently in stepdown/ICU unit. Chart reviewed. Will not make bedside visit at this time. Will continue to follow and make appropriate [place] referral once disposition is known. Will make inpatient RNCM aware that Allen Beasley has been followed by [place] Management program.    Marthenia Rolling, [place], RN,BSN [facility] Liaison [phone]

## 2016-03-02 NOTE — Progress Notes (Signed)
[place] Progress Note Patient Name: Allen Beasley DOB: 26-Jun-1923 MRN: [id number]   Date of Service  03/02/2016  HPI/Events of Note  Called on this 90 with soft BP elink made aware by nurse Patient resting comfortably  eICU Interventions  No further recs        Asia Favata 03/02/2016, 4:04 PM

## 2016-03-02 NOTE — Progress Notes (Signed)
PT Cancellation Note  Patient Details Name: Allen Beasley MRN: [id number] DOB: 05-19-1923   Cancelled Treatment:    Reason Eval/Treat Not Completed: Medical issues which prohibited therapy (required BiPAP overnight. )   Claretha Cooper 03/02/2016, 7:48 AM Tresa Endo PT [phone]

## 2016-03-02 NOTE — Progress Notes (Signed)
3 Days Post-Op  Subjective: Didn't get much clears yesterday due to breathing issues. Denies abd pain. +flatus. No burping.   Objective: Vital signs in last 24 hours: Temp:  [97.8 F (36.6 C)-99.8 F (37.7 C)] 98.3 F (36.8 C) (11/21 1200) Pulse Rate:  [78-100] 83 (11/21 1200) Resp:  [16-32] 16 (11/21 1200) BP: (90-172)/(31-141) 114/48 (11/21 1200) SpO2:  [85 %-99 %] 92 % (11/21 1300) FiO2 (%):  [100 %] 100 % (11/21 1230) Last BM Date: 02/29/16  Intake/Output from previous day: 11/20 0701 - 11/21 0700 In: 390 [P.O.:240; IV Piggyback:150] Out: 2425 [Urine:2425] Intake/Output this shift: Total I/O In: 265 [IV Piggyback:265] Out: 1200 [Urine:1200]  Resting comfortably,  Soft, nd, nt, incision c/d/i - mild swelling.   Lab Results:   Recent Labs  03/01/16 0806 03/02/16 0307  WBC 10.7* 8.2  HGB 11.2* 10.5*  HCT 36.2* 34.2*  PLT 139* 147*   BMET  Recent Labs  03/01/16 0806 03/02/16 0307  NA 141 140  K 3.7 3.2*  CL 106 99*  CO2 30 35*  GLUCOSE 100* 115*  BUN 30* 25*  CREATININE 0.60* 0.69  CALCIUM 7.9* 7.9*   PT/INR No results for input(s): LABPROT, INR in the last 72 hours. ABG  Recent Labs  02/28/16 2025  PHART 7.417  HCO3 33.0*    Studies/Results: Dg Chest Port 1 View  Result Date: 03/01/2016 CLINICAL DATA:  COPD, hypertension, rales, hypoxia, history pneumonia EXAM: PORTABLE CHEST 1 VIEW COMPARISON:  Portable exam 0750 hours compared to 02/28/2016 FINDINGS: Slightly rotated to the RIGHT. Nasogastric tube extends to at least the distal esophagus, unable to visualize tip due to light technique. Stable heart size. Increased bibasilar opacities particularly RIGHT lower lobe consistent with pneumonia. Persistent RIGHT upper lobe infiltrate. Underlying COPD changes. No pneumothorax. Bones demineralized. IMPRESSION: Persistent RIGHT upper lobe infiltrate with increased bibasilar opacities question pneumonia, especially RIGHT lower lobe. Electronically Signed    By: Lavonia Dana M.D.   On: 03/01/2016 08:12    Anti-infectives: Anti-infectives    Start     Dose/Rate Route Frequency Ordered Stop   03/01/16 1400  levofloxacin (LEVAQUIN) IVPB 750 mg     750 mg 100 mL/hr over 90 Minutes Intravenous Every 24 hours 03/01/16 1312     02/29/16 0600  ciprofloxacin (CIPRO) IVPB 400 mg  Status:  Discontinued     400 mg 200 mL/hr over 60 Minutes Intravenous On call to O.R. 02/28/16 1731 02/29/16 0603   02/29/16 0200  vancomycin (VANCOCIN) IVPB 1000 mg/200 mL premix  Status:  Discontinued     1,000 mg 200 mL/hr over 60 Minutes Intravenous Every 12 hours 02/28/16 1340 03/01/16 1156   02/28/16 2200  aztreonam (AZACTAM) 2 g in dextrose 5 % 50 mL IVPB  Status:  Discontinued     2 g 100 mL/hr over 30 Minutes Intravenous Every 8 hours 02/28/16 1340 02/28/16 1430   02/28/16 2200  aztreonam (AZACTAM) 2 g in dextrose 5 % 50 mL IVPB  Status:  Discontinued     2 g 100 mL/hr over 30 Minutes Intravenous Every 8 hours 02/28/16 2140 03/01/16 1156   02/28/16 1330  aztreonam (AZACTAM) 2 g in dextrose 5 % 50 mL IVPB     2 g 100 mL/hr over 30 Minutes Intravenous  Once 02/28/16 1317 02/28/16 1417   02/28/16 1330  vancomycin (VANCOCIN) IVPB 1000 mg/200 mL premix     1,000 mg 200 mL/hr over 60 Minutes Intravenous  Once 02/28/16 1317 02/28/16 1737  Assessment/Plan: s/p Procedure(s): RIGHT INCARCERATED INGUINAL HERNIA, (Right) by Dr Barry Dienes POD 3  Doing ok from GI perspective.  Clears today. If no issues with clears this evening, adv to fulls in am - aspiration precautions Pt/ot pulm toilet  Leighton Ruff. Redmond Pulling, MD, FACS General, Bariatric, & Minimally Invasive Surgery [facility], [place]    LOS: 3 days    [place]ay[place] Curry 03/02/2016

## 2016-03-02 NOTE — Progress Notes (Signed)
*  Preliminary Results* Bilateral lower extremity venous duplex completed. Bilateral lower extremities are negative for deep vein thrombosis. There is no evidence of Baker's cyst bilaterally.  03/02/2016 9:38 AM Maudry Mayhew, BS, RVT, RDCS, RDMS

## 2016-03-02 NOTE — Progress Notes (Signed)
   Name: EDON HOADLEY MRN: [id number] DOB: 10/14/1923    ADMISSION DATE:  02/28/2016 CONSULTATION DATE:  11/20  REFERRING MD :  Dr. Sheran Fava  CHIEF COMPLAINT:  Hypoxia  BRIEF PATIENT DESCRIPTION:  82 year old male recently admitted with necrotizing pneumonia discharged on 6 L with 4 weeks of antibiotics. Was able to come off of oxygen, however, admitted 11/18 with incarcerated hernia. That is now repaired, but he is having worsening oxygen requirements.  SIGNIFICANT EVENTS  11/7 discharged on 6L [place] and Levaquin for necrotizing PNA 11/18 admit and to OR for incarcerated hernia 11/20 O2 demands up. PCCM consult.  STUDIES:    SUBJECTIVE:  Was on BIPAP last night. Feels better now that he's on high flow  VITAL SIGNS: Temp:  [97.8 F (36.6 C)-99.8 F (37.7 C)] 97.8 F (36.6 C) (11/21 0800) Pulse Rate:  [78-103] 80 (11/21 1100) Resp:  [19-38] 26 (11/21 1100) BP: (90-172)/(31-141) 113/73 (11/21 1100) SpO2:  [85 %-99 %] 99 % (11/21 1100) FiO2 (%):  [100 %] 100 % (11/21 0910) igh flow  PHYSICAL EXAMINATION: General:  Pleasant elderly male in NAD Neuro:  Awake, alert, oriented, non-focal HEENT:  [place]/AT, PERRL, no JVD Cardiovascular:  RRR, no MRG Lungs:  [place] R>L, no accessory use  Abdomen:  Soft, non-tender, non-distended Musculoskeletal:  No acute deformity or ROM limitation Skin:  Grossly intact   Recent Labs Lab 02/29/16 0500 03/01/16 0806 03/02/16 0307  NA 140 141 140  K 3.9 3.7 3.2*  CL 103 106 99*  CO2 32 30 35*  BUN 24* 30* 25*  CREATININE 0.77 0.60* 0.69  GLUCOSE 137* 100* 115*    Recent Labs Lab 02/29/16 0500 03/01/16 0806 03/02/16 0307  HGB 11.0* 11.2* 10.5*  HCT 34.7* 36.2* 34.2*  WBC 15.2* 10.7* 8.2  PLT 138* 139* 147*   Dg Chest Port 1 View  Result Date: 03/01/2016 CLINICAL DATA:  COPD, hypertension, rales, hypoxia, history pneumonia EXAM: PORTABLE CHEST 1 VIEW COMPARISON:  Portable exam 0750 hours compared to 02/28/2016 FINDINGS: Slightly  rotated to the RIGHT. Nasogastric tube extends to at least the distal esophagus, unable to visualize tip due to light technique. Stable heart size. Increased bibasilar opacities particularly RIGHT lower lobe consistent with pneumonia. Persistent RIGHT upper lobe infiltrate. Underlying COPD changes. No pneumothorax. Bones demineralized. IMPRESSION: Persistent RIGHT upper lobe infiltrate with increased bibasilar opacities question pneumonia, especially RIGHT lower lobe. Electronically Signed   By: Lavonia Dana M.D.   On: 03/01/2016 08:12    ASSESSMENT / PLAN:  Acute on chronic hypoxemic respiratory failure: Aspiration PNA/HCAP COPD/empyhsema  Necrotizing PNA (known) S/p repair of incarcerated hernia (11/20) Hypokalemia  Anemia of chronic disease.    Likely multifactorial in the setting of chronic emphysema, known necrotizing pneumonia, and now chest x-ray demonstrating diffuse right lung opacification in addition to bibasilar opacification. He is known to have bilateral pleural effusions from his last admission. Most likely scenario here is that he suffered recurrent aspiration when vomiting due to his bowel obstruction. Seems to be responding to current supportive care..ABx, oxygen, BDs, and abx  Plan Cont supportive care Transition to High-flow oxygen w/ PRN BIPAP Agree w/ lasix x1 Cont BDs Cont levaquin  F/u CXR in am   Erick Colace ACNP-BC [place] Pager # [phone] OR # [phone] if no answer     03/02/2016 12:34 PM

## 2016-03-02 NOTE — Progress Notes (Signed)
PROGRESS NOTE  Allen Beasley  [id number] DOB: February 28, 1924 DOA: 02/28/2016 PCP: Geoffery Lyons, MD  Brief Narrative:  Patient is a 82 year old male with COPD, aortic insufficiency, hypertension, hyperlipidemia, was recently admitted from 10/26-11/7/17 for sepsis and necrotizing pneumonia, was discharged with a 4-week course of Levaquin.  He returned to the ER with nausea, vomiting, abdominal distention, pain, and inability to have a BM.  CT scan ab/p demonstrated an incarcerated direct right inguinal hernia.  He underwent urgent primary repair by Dr. Barry Dienes on 11/18.  He was persistently hypotensive post-operatively with decreased uop and required multiple boluses and IVF.  On 11/20, he developed worsening respiratory distress and despite multiple doses of lasix, required nonrebreather.  CXR demonstrated worsening RLL pneumonia and patient was already on broad spectrum antibiotics.  Suspect aspiration pneumonia.  PCCM was consulted who recommended bipap with close monitoring in case of further respiratory compromise.  Despite his recent abdominal surgery, he tolerated bipap well and was changed to HFNC on 11/21.    Assessment & Plan:   Principal Problem:   SBO (small bowel obstruction) Active Problems:   COPD with emphysema (HCC)   HTN (hypertension)   Acute on chronic respiratory failure with hypoxia (HCC)   Esophageal reflux   Multifocal pneumonia   HCAP (healthcare-associated pneumonia)  SBO (small bowel obstruction) due to incarcerated right inguinal hernia s/p primary repair by Dr. Barry Dienes on 11/18 -  Excellent bowel sounds -  Appreciate general surgery assistance -  Diet advancement slowed by development of respiratory distress  Active Problems: Acute on chronic respiratory failure due to COPD with emphysema ([place]) and recent pneumonia with probable superimposed aspiration pneumonia -  Repeat CXR:  RLL pneumonia -  Lasix 40mg  IV once again this morning -  IS and sitting  upright -  Flutter valve -  Xopenex and Spiriva -  Transitioned to HFNC -  Appreciate PCCM assistance -  Continue fluoroquinolone    HTN (hypertension), BP stable -  Continue to hold antihypertensives  Esophageal reflux, stable, continue IV PPI  Hypokalemia due to diuresis -  IV potassium supplementation -  Repeat BMP in AM  Normocytic anemia - hemoglobin trending down slightly, but no evidence of hemorrhage -  Iron studies, B12, folate -  TSH -  Occult stool -  Repeat hgb in AM   DVT prophylaxis:  heparin Code Status:  full Family Communication:  Patient alone Disposition Plan:  Pending improvements in respiratory status, tolerating diet.  PT recommending SNF.    Consultants:   [facility], Dr. Barry Dienes  PCCM, Dr. Milinda Hirschfeld  Procedures:  Primary repair of incarcerated direct right inguinal hernia.    Antimicrobials:  Anti-infectives    Start     Dose/Rate Route Frequency Ordered Stop   03/01/16 1400  levofloxacin (LEVAQUIN) IVPB 750 mg     750 mg 100 mL/hr over 90 Minutes Intravenous Every 24 hours 03/01/16 1312     02/29/16 0600  ciprofloxacin (CIPRO) IVPB 400 mg  Status:  Discontinued     400 mg 200 mL/hr over 60 Minutes Intravenous On call to O.R. 02/28/16 1731 02/29/16 0603   02/29/16 0200  vancomycin (VANCOCIN) IVPB 1000 mg/200 mL premix  Status:  Discontinued     1,000 mg 200 mL/hr over 60 Minutes Intravenous Every 12 hours 02/28/16 1340 03/01/16 1156   02/28/16 2200  aztreonam (AZACTAM) 2 g in dextrose 5 % 50 mL IVPB  Status:  Discontinued     2 g 100 mL/hr over  30 Minutes Intravenous Every 8 hours 02/28/16 1340 02/28/16 1430   02/28/16 2200  aztreonam (AZACTAM) 2 g in dextrose 5 % 50 mL IVPB  Status:  Discontinued     2 g 100 mL/hr over 30 Minutes Intravenous Every 8 hours 02/28/16 2140 03/01/16 1156   02/28/16 1330  aztreonam (AZACTAM) 2 g in dextrose 5 % 50 mL IVPB     2 g 100 mL/hr over 30 Minutes Intravenous  Once 02/28/16 1317 02/28/16  1417   02/28/16 1330  vancomycin (VANCOCIN) IVPB 1000 mg/200 mL premix     1,000 mg 200 mL/hr over 60 Minutes Intravenous  Once 02/28/16 1317 02/28/16 1737          Subjective:  Breathing more comfortably on bipap.  Cold and would like some blankets.  Denies abdominal pain but may be developing some abdominal distension.  No BMs.    Objective: Vitals:   03/02/16 0900 03/02/16 0910 03/02/16 1100 03/02/16 1200  BP: (!) 163/98  113/73   Pulse: 80  80   Resp: (!) 29  (!) 26   Temp:    98.3 F (36.8 C)  TempSrc:    Axillary  SpO2: 99% 98% 99%   Weight:      Height:        Intake/Output Summary (Last 24 hours) at 03/02/16 1307 Last data filed at 03/02/16 1115  Gross per 24 hour  Intake              415 ml  Output             2825 ml  Net            -2410 ml   Filed Weights   02/28/16 1241 02/29/16 0000  Weight: 83.9 kg (185 lb) 85.2 kg (187 lb 13.3 oz)    Examination:  General exam:  Adult male.  Tachypneic while on bipap.  14/8, 100%   HEENT:  NCAT, MMM Respiratory system:  rhonchorous bilateral BS with rales, no obvious wheeze heard with bipap on Cardiovascular system: Regular rate and rhythm, normal S1/S2. No murmurs, rubs, gallops or clicks.  Warm extremities Gastrointestinal system:  Normal active bowel sounds, soft, moderately distended, mild TTP in the RLQ without rebound or guarding.  Dressing over right groin with mostly serous drainage MSK:  Normal tone and bulk, 1+ pitting bilateral ankle edema Neuro:  Grossly moves all extremities    Data Reviewed: I have personally reviewed following labs and imaging studies  CBC:  Recent Labs Lab 02/28/16 1058 02/29/16 0500 03/01/16 0806 03/02/16 0307  WBC 19.8* 15.2* 10.7* 8.2  HGB 13.5 11.0* 11.2* 10.5*  HCT 40.7 34.7* 36.2* 34.2*  MCV 87.5 91.6 92.1 91.4  PLT 169 138* 139* [id number]*   Basic Metabolic Panel:  Recent Labs Lab 02/28/16 1058 02/29/16 0500 03/01/16 0806 03/02/16 0307  NA 136 140 141 140  K  3.9 3.9 3.7 3.2*  CL 97* 103 106 99*  CO2 29 32 30 35*  GLUCOSE 188* 137* 100* 115*  BUN 22* 24* 30* 25*  CREATININE 0.83 0.77 0.60* 0.69  CALCIUM 9.4 7.9* 7.9* 7.9*   GFR: Estimated Creatinine Clearance: 71.4 mL/min (by C-G formula based on SCr of 0.69 mg/dL). Liver Function Tests:  Recent Labs Lab 02/28/16 1058  AST 22  ALT 14*  ALKPHOS 51  BILITOT 1.3*  PROT 6.9  ALBUMIN 3.3*   No results for input(s): LIPASE, AMYLASE in the last 168 hours. No results for input(s): AMMONIA in  the last 168 hours. Coagulation Profile: No results for input(s): INR, PROTIME in the last 168 hours. Cardiac Enzymes: No results for input(s): CKTOTAL, CKMB, CKMBINDEX, TROPONINI in the last 168 hours. BNP (last 3 results) No results for input(s): PROBNP in the last 8760 hours. HbA1C: No results for input(s): HGBA1C in the last 72 hours. CBG: No results for input(s): GLUCAP in the last 168 hours. Lipid Profile: No results for input(s): CHOL, HDL, LDLCALC, TRIG, CHOLHDL, LDLDIRECT in the last 72 hours. Thyroid Function Tests: No results for input(s): TSH, T4TOTAL, FREET4, T3FREE, THYROIDAB in the last 72 hours. Anemia Panel: No results for input(s): VITAMINB12, FOLATE, FERRITIN, TIBC, IRON, RETICCTPCT in the last 72 hours. Urine analysis:    Component Value Date/Time   COLORURINE AMBER (A) 02/28/2016 1255   APPEARANCEUR CLOUDY (A) 02/28/2016 1255   LABSPEC 1.022 02/28/2016 1255   PHURINE 6.0 02/28/2016 1255   GLUCOSEU NEGATIVE 02/28/2016 1255   HGBUR LARGE (A) 02/28/2016 1255   BILIRUBINUR SMALL (A) 02/28/2016 1255   KETONESUR NEGATIVE 02/28/2016 1255   PROTEINUR 30 (A) 02/28/2016 1255   UROBILINOGEN 0.2 08/19/2012 1511   NITRITE NEGATIVE 02/28/2016 1255   LEUKOCYTESUR SMALL (A) 02/28/2016 1255   Sepsis Labs: @LABRCNTIP (procalcitonin:4,lacticidven:4)  ) Recent Results (from the past 240 hour(s))  Blood culture (routine x 2)     Status: None (Preliminary result)   Collection Time:  02/28/16 12:29 PM  Result Value Ref Range Status   Specimen Description BLOOD BLOOD LEFT FOREARM  Final   Special Requests BOTTLES DRAWN AEROBIC AND ANAEROBIC 5CC  Final   Culture   Final    NO GROWTH 3 DAYS Performed at [facility]    Report Status PENDING  Incomplete  Blood culture (routine x 2)     Status: None (Preliminary result)   Collection Time: 02/28/16 12:39 PM  Result Value Ref Range Status   Specimen Description BLOOD BLOOD RIGHT FOREARM  Final   Special Requests BOTTLES DRAWN AEROBIC AND ANAEROBIC 5CC  Final   Culture   Final    NO GROWTH 3 DAYS Performed at [facility]    Report Status PENDING  Incomplete  Urine culture     Status: Abnormal   Collection Time: 02/28/16 12:55 PM  Result Value Ref Range Status   Specimen Description URINE, CATHETERIZED  Final   Special Requests NONE  Final   Culture (A)  Final    30,000 COLONIES/mL STAPHYLOCOCCUS SPECIES (COAGULASE NEGATIVE)   Report Status 03/02/2016 FINAL  Final   Organism ID, Bacteria STAPHYLOCOCCUS SPECIES (COAGULASE NEGATIVE) (A)  Final      Susceptibility   Staphylococcus species (coagulase negative) - MIC*    CIPROFLOXACIN >=8 RESISTANT Resistant     GENTAMICIN 8 INTERMEDIATE Intermediate     NITROFURANTOIN <=16 SENSITIVE Sensitive     OXACILLIN >=4 RESISTANT Resistant     TETRACYCLINE >=16 RESISTANT Resistant     VANCOMYCIN 1 SENSITIVE Sensitive     TRIMETH/SULFA 80 RESISTANT Resistant     CLINDAMYCIN >=8 RESISTANT Resistant     RIFAMPIN <=0.5 SENSITIVE Sensitive     Inducible Clindamycin NEGATIVE Sensitive     * 30,000 COLONIES/mL STAPHYLOCOCCUS SPECIES (COAGULASE NEGATIVE)  MRSA PCR Screening     Status: None   Collection Time: 02/28/16 10:44 PM  Result Value Ref Range Status   MRSA by PCR NEGATIVE NEGATIVE Final    Comment:        The GeneXpert MRSA Assay (FDA approved for NASAL specimens only), is one  component of a comprehensive MRSA colonization surveillance program. It is  not intended to diagnose MRSA infection nor to guide or monitor treatment for MRSA infections.       Radiology Studies: Dg Chest Port 1 View  Result Date: 03/01/2016 CLINICAL DATA:  COPD, hypertension, rales, hypoxia, history pneumonia EXAM: PORTABLE CHEST 1 VIEW COMPARISON:  Portable exam 0750 hours compared to 02/28/2016 FINDINGS: Slightly rotated to the RIGHT. Nasogastric tube extends to at least the distal esophagus, unable to visualize tip due to light technique. Stable heart size. Increased bibasilar opacities particularly RIGHT lower lobe consistent with pneumonia. Persistent RIGHT upper lobe infiltrate. Underlying COPD changes. No pneumothorax. Bones demineralized. IMPRESSION: Persistent RIGHT upper lobe infiltrate with increased bibasilar opacities question pneumonia, especially RIGHT lower lobe. Electronically Signed   By: Lavonia Dana M.D.   On: 03/01/2016 08:12     Scheduled Meds: . heparin  5,000 Units Subcutaneous Q8H  . ipratropium-albuterol  3 mL Nebulization TID  . levofloxacin (LEVAQUIN) IV  750 mg Intravenous Q24H  . mouth rinse  15 mL Mouth Rinse BID  . pantoprazole (PROTONIX) IV  40 mg Intravenous Q12H  . sodium chloride  1,000 mL Intravenous Once   Continuous Infusions:    LOS: 3 days    Time spent: 30 min    Janece Canterbury, MD [facility] Pager [phone]  If 7PM-7AM, please contact night-coverage [email or web address] Password [email or web address] 03/02/2016, 1:07 PM

## 2016-03-02 NOTE — Progress Notes (Deleted)
[place] Progress Note Patient Name: Allen Beasley DOB: September 30, 1923 MRN: [id number]  Entered in error RN notified  Intervention Category Intermediate Interventions: Oliguria - evaluation and management  Simonne Maffucci 03/02/2016, 3:40 AM

## 2016-03-02 NOTE — Progress Notes (Signed)
[place] Progress Note Patient Name: Allen Beasley DOB: 22-Jun-1923 MRN: [id number]   Date of Service  03/02/2016  HPI/Events of Note  MICRO ROUNDS On levoflox for staph species in  urine culture from 11/18  eICU Interventions  Repeat U Culture        Flora Lipps 03/02/2016, 5:48 PM

## 2016-03-02 NOTE — Progress Notes (Signed)
[place] Progress Note Patient Name: Allen Beasley DOB: 1923-05-25 MRN: [id number]   Date of Service  03/02/2016  HPI/Events of Note  Increased work of breathing and O2 saturations 88% on NRB  eICU Interventions  Try NIMV On ground PCCM team to evaluate bedside     Intervention Category Intermediate Interventions: Respiratory distress - evaluation and management  Simonne Maffucci 03/02/2016, 12:44 AM

## 2016-03-02 NOTE — Progress Notes (Signed)
[facility]link called to just make aware of soft pressures with MAPs in the 50's post Lasix. Patient with no complaints or signs of distress and still making significant urine.

## 2016-03-02 NOTE — Progress Notes (Signed)
Date:  March 02, 2016 Chart reviewed for concurrent status and case management needs. Will continue to follow patient progress.  PATIENT placed on bi-pap due to resp distress. Discharge Planning: following for needs Expected discharge date: [id number] [name], BSN, [place], [place]

## 2016-03-03 ENCOUNTER — Other Ambulatory Visit: Payer: Self-pay

## 2016-03-03 DIAGNOSIS — K56609 Unspecified intestinal obstruction, unspecified as to partial versus complete obstruction: Secondary | ICD-10-CM

## 2016-03-03 DIAGNOSIS — I10 Essential (primary) hypertension: Secondary | ICD-10-CM

## 2016-03-03 LAB — IRON AND TIBC
IRON: 41 ug/dL — AB (ref 45–182)
Saturation Ratios: 36 % (ref 17.9–39.5)
TIBC: 115 ug/dL — ABNORMAL LOW (ref 250–450)
UIBC: 74 ug/dL

## 2016-03-03 LAB — CBC
HCT: 33.9 % — ABNORMAL LOW (ref 39.0–52.0)
HEMOGLOBIN: 10.5 g/dL — AB (ref 13.0–17.0)
MCH: 28.7 pg (ref 26.0–34.0)
MCHC: 31 g/dL (ref 30.0–36.0)
MCV: 92.6 fL (ref 78.0–100.0)
Platelets: 165 10*3/uL (ref 150–400)
RBC: 3.66 MIL/uL — AB (ref 4.22–5.81)
RDW: 14.9 % (ref 11.5–15.5)
WBC: 5.3 10*3/uL (ref 4.0–10.5)

## 2016-03-03 LAB — BASIC METABOLIC PANEL
ANION GAP: 6 (ref 5–15)
BUN: 22 mg/dL — ABNORMAL HIGH (ref 6–20)
CHLORIDE: 97 mmol/L — AB (ref 101–111)
CO2: 38 mmol/L — AB (ref 22–32)
Calcium: 8 mg/dL — ABNORMAL LOW (ref 8.9–10.3)
Creatinine, Ser: 0.54 mg/dL — ABNORMAL LOW (ref 0.61–1.24)
GFR calc non Af Amer: >60 mL/min (ref >60)
Glucose, Bld: 114 mg/dL — ABNORMAL HIGH (ref 65–99)
Potassium: 3.3 mmol/L — ABNORMAL LOW (ref 3.5–5.1)
Sodium: 141 mmol/L (ref 135–145)

## 2016-03-03 LAB — TSH: TSH: 0.909 u[IU]/mL (ref 0.350–4.500)

## 2016-03-03 LAB — VITAMIN B12: Vitamin B-12: 632 pg/mL (ref 180–914)

## 2016-03-03 LAB — FOLATE: FOLATE: 9 ng/mL (ref >5.9)

## 2016-03-03 LAB — FERRITIN: FERRITIN: 447 ng/mL — AB (ref 24–336)

## 2016-03-03 MED ORDER — SODIUM CHLORIDE 0.9 % IV SOLN
30.0000 meq | Freq: Once | INTRAVENOUS | Status: AC
  Administered 2016-03-03: 30 meq via INTRAVENOUS
  Filled 2016-03-03: qty 15

## 2016-03-03 NOTE — Progress Notes (Signed)
CSW assisting with d/c planning. Bed offers provided to pt / niece. They will review offers to contact CSW with SNF choice. CSW will continue to follow to assist with d/c planning.  Werner Lean LCSW [phone]

## 2016-03-03 NOTE — Progress Notes (Signed)
Name: Allen Beasley MRN: [id number] DOB: 1924-01-10    ADMISSION DATE:  02/28/2016 CONSULTATION DATE:  11/20  REFERRING MD :  Dr. Sheran Fava  CHIEF COMPLAINT:  Hypoxia  BRIEF PATIENT DESCRIPTION: 82 y.o. male recently admitted with necrotizing pneumonia discharged on 6 L with 4 weeks of antibiotics. Was able to come off of oxygen, however, admitted 11/18 with incarcerated hernia. That is now repaired, but he is having worsening oxygen requirements.  SIGNIFICANT EVENTS  11/07 - discharged on 6L [place] and Levaquin for necrotizing PNA 11/18 - admit and to OR for incarcerated hernia 11/20 - O2 demands up. PCCM consult.  STUDIES:  PORT CXR 11/20:  Personally reviewed by me. Patchy bilateral opacities worse on the right compared with the left.  MICROBIOLOGY: Blood Ctx x2 11/18 >> Urine Ctx 11/18:  Coag Negative Staph MRSA PCR 11/18:  Negative  Sputum Ctx 11/21 >> Urine Ctx 11/22 >>  ANTIBIOTICS: Aztreonam 11/18 (periop prophylaxis) Vancomycin 11/18 - 11/20 Levaquin 11/20 >>  SUBJECTIVE: Weaned off BiPAP yesterday. Continuing to wean on oxygen requirement from high flow nasal cannula. Patient is down to 12.5 from 30 earlier this morning. Denies any worsening dyspnea. Intermittent coughing. Continuing to use a cappella and incentive spirometer. Denies any chest pain or pressure.  REVIEW OF SYSTEMS: Denies any abdominal pain, nausea, or vomiting. Denies any headache or vision changes. Denies any subjective fever or chills.  VITAL SIGNS: Temp:  [97.7 F (36.5 C)-98.7 F (37.1 C)] 98.5 F (36.9 C) (11/22 1151) Pulse Rate:  [68-87] 85 (11/22 1000) Resp:  [14-36] 26 (11/22 1000) BP: (85-154)/(25-69) 131/57 (11/22 1000) SpO2:  [85 %-98 %] 95 % (11/22 1120) FiO2 (%):  [100 %] 100 % (11/22 0840)  PHYSICAL EXAMINATION:  Gen.: Sitting up in chair. Eating lunch which consists of bread and barbecued meat. Integument: Warm and dry. No rash on exposed skin. HEENT: Moist mucous membranes. No  scleral icterus or injection. Nasal cannula in place. Pulmonary: Mild rhonchi bilaterally right greater than left. Normal work of breathing on high flow nasal cannula oxygen. Speaking in complete sentences. Cardiovascular: Regular rate. Unable to appreciate JVD given body positioning.   Recent Labs Lab 03/01/16 0806 03/02/16 0307 03/03/16 0304  NA 141 140 141  K 3.7 3.2* 3.3*  CL 106 99* 97*  CO2 30 35* 38*  BUN 30* 25* 22*  CREATININE 0.60* 0.69 0.54*  GLUCOSE 100* 115* 114*    Recent Labs Lab 03/01/16 0806 03/02/16 0307 03/03/16 0304  HGB 11.2* 10.5* 10.5*  HCT 36.2* 34.2* 33.9*  WBC 10.7* 8.2 5.3  PLT 139* 147* 165   No results found.  ASSESSMENT / PLAN:  82 y.o. male status post surgery for incarcerated hernia. Now with acute hypoxic respiratory failure likely secondary to aspiration pneumonia. Patient's oxygen requirement is steadily improving. Personally reviewed his chest x-ray again which is consistent with aspiration pneumonia versus pneumonitis. With pending cultures I would recommend continuing empiric treatment with Levaquin.  1. Acute Hypoxic Respiratory Failure:  Agree with continuing to wean oxygen as tolerated. Continuing airway clearance/pulmonary toilet with flutter valve and incentive spirometry. Can continue to utilize Lasix intermittently for diuresis as renal function and blood pressure allow but suspect most of this is due to aspiration pneumonia versus pneumonitis.  2. COPD/Emphysema:  No signs of acute exacerbation. Continuing on DuoNeb 3 times a day. 3. Aspiration Pneumonia/HCAP:  Recommend continuing on empiric Levaquin for a total of 10 days while awaiting culture results.  At this time PCCM will  sign off. Please notify me if we can be of any further assistance in his care.   Sonia Baller Ashok Cordia, M.D. [facility] Pager:  [phone] After 3pm or if no response, call [phone] 03/03/2016 12:54 PM

## 2016-03-03 NOTE — Progress Notes (Signed)
Physical Therapy Treatment Patient Details Name: Allen Beasley MRN: [id number] DOB: [birth year] Today's Date: 03/03/2016    History of Present Illness Allen Beasley is a 82 y.o. male with medical history significant of COPD/emphysema, HTN, HMD, aortic insufficiency; who presents 02/28/16 with complaints of abdominal  cramps, Nausea and vomiting. Patient was recently admitted from 10/26-11/7/17 for sepsis and pneumonia. repair of incarcerated hernia on 11/18.    PT Comments    Marked improvement in activity tolerance vs last session.  Pt and family pleased with progress in mobility.  Follow Up Recommendations  SNF;Supervision/Assistance - 24 hour     Equipment Recommendations  None recommended by PT    Recommendations for Other Services       Precautions / Restrictions Precautions Precautions: Fall Precaution Comments: On hiflo O2 Restrictions Weight Bearing Restrictions: No    Mobility  Bed Mobility Overal bed mobility: Needs Assistance Bed Mobility: Supine to Sit     Supine to sit: Min assist     General bed mobility comments: increased time  Transfers Overall transfer level: Needs assistance Equipment used: Rolling walker (2 wheeled) Transfers: Sit to/from Stand Sit to Stand: Min assist;+2 physical assistance;+2 safety/equipment         General transfer comment: cues for use of UEs  Ambulation/Gait Ambulation/Gait assistance: Min assist;+2 safety/equipment Ambulation Distance (Feet): 28 Feet Assistive device: Rolling walker (2 wheeled) Gait Pattern/deviations: Step-to pattern;Decreased step length - right;Decreased step length - left;Shuffle;Trunk flexed Gait velocity: decreased Gait velocity interpretation: Below normal speed for age/gender General Gait Details: several standing rest breaks to maintain O2 sats above 88%.   Stairs            Wheelchair Mobility    Modified Rankin (Stroke Patients Only)       Balance     Sitting  balance-Leahy Scale: Good     Standing balance support: During functional activity Standing balance-Leahy Scale: Fair                      Cognition Arousal/Alertness: Awake/alert Behavior During Therapy: WFL for tasks assessed/performed Overall Cognitive Status: Within Functional Limits for tasks assessed                      Exercises      General Comments        Pertinent Vitals/Pain Pain Assessment: Faces Faces Pain Scale: No hurt Pain Location: bottom Pain Intervention(s): Repositioned;Other (comment) (geomat in chair)    Home Living                      Prior Function            PT Goals (current goals can now be found in the care plan section) Acute Rehab PT Goals Patient Stated Goal: to get up PT Goal Formulation: With patient Time For Goal Achievement: 03/14/16 Potential to Achieve Goals: Good Progress towards PT goals: Progressing toward goals    Frequency    Min 3X/week      PT Plan Current plan remains appropriate    Co-evaluation             End of Session Equipment Utilized During Treatment: Gait belt;Oxygen Activity Tolerance: Patient limited by fatigue Patient left: in chair;with call bell/phone within reach;with chair alarm set;with family/visitor present     Time: [id number] PT Time Calculation (min) (ACUTE ONLY): 26 min  Charges:  $Gait Training: 8-22 mins $Therapeutic Activity: 8-22 mins  G Codes:      Laverne Hursey 2016/03/17, 1:23 PM

## 2016-03-03 NOTE — Patient Outreach (Signed)
[facility]) Care Management  03/03/2016  [name] [birth year] [id number]   Chart review: Patient remains inpatient at this time. Hospital liaison aware.   Will continue to follow patients status.  Quinn Plowman RN,BSN,CCM [facility] Telephonic  [phone]

## 2016-03-03 NOTE — Progress Notes (Signed)
[facility] ADULT ICU REPLACEMENT PROTOCOL FOR AM LAB REPLACEMENT ONLY  The patient does apply for the [facility] Adult ICU Electrolyte Replacment Protocol based on the criteria listed below:   1. Is GFR >/= 40 ml/min? Yes.    Patient's GFR today is >60 2. Is urine output >/= 0.5 ml/kg/hr for the last 6 hours? Yes.   Patient's UOP is 0.7 ml/kg/hr 3. Is BUN < 60 mg/dL? Yes.    Patient's BUN today is 22 4. Abnormal electrolyte(s): K 3.3 5. Ordered repletion with: protocol 6. If a panic level lab has been reported, has the CCM MD in charge been notified? No..   Physician:    Ronda Fairly A 03/03/2016 4:27 AM

## 2016-03-03 NOTE — Progress Notes (Signed)
PHARMACY NOTE -  [facility] has been assisting with dosing of Levaquin for Pneumonia. Dosage remains stable at 750mg  IV q24h and need for further dosage adjustment appears unlikely at present.    Will sign off at this time (will follow peripherally while in ICU).  Please reconsult if a change in clinical status warrants re-evaluation of dosage. Consider change to PO once patient can tolerate  Thanks! Netta Cedars, PharmD, BCPS Pager: [phone] 03/03/2016@12 :43 PM

## 2016-03-03 NOTE — Progress Notes (Signed)
4 Days Post-Op  Subjective: Tolerated clears. Reports flatus. Denies n/v.   Objective: Vital signs in last 24 hours: Temp:  [97.7 F (36.5 C)-98.7 F (37.1 C)] 98.7 F (37.1 C) (11/22 0800) Pulse Rate:  [68-87] 79 (11/22 0700) Resp:  [14-36] 27 (11/22 0700) BP: (85-163)/(25-98) 147/44 (11/22 0700) SpO2:  [85 %-99 %] 93 % (11/22 0840) FiO2 (%):  [100 %] 100 % (11/22 0840) Last BM Date: 02/29/16  Intake/Output from previous day: 11/21 0701 - 11/22 0700 In: 920 [IV Piggyback:830] Out: 1885 [Urine:1885] Intake/Output this shift: No intake/output data recorded.  More alert, breathing nonlabored Soft, nd, nt, incision c/d/i  Lab Results:   Recent Labs  03/02/16 0307 03/03/16 0304  WBC 8.2 5.3  HGB 10.5* 10.5*  HCT 34.2* 33.9*  PLT 147* 165   BMET  Recent Labs  03/02/16 0307 03/03/16 0304  NA 140 141  K 3.2* 3.3*  CL 99* 97*  CO2 35* 38*  GLUCOSE 115* 114*  BUN 25* 22*  CREATININE 0.69 0.54*  CALCIUM 7.9* 8.0*   PT/INR No results for input(s): LABPROT, INR in the last 72 hours. ABG No results for input(s): PHART, HCO3 in the last 72 hours.  Invalid input(s): PCO2, PO2  Studies/Results: No results found.  Anti-infectives: Anti-infectives    Start     Dose/Rate Route Frequency Ordered Stop   03/01/16 1400  levofloxacin (LEVAQUIN) IVPB 750 mg     750 mg 100 mL/hr over 90 Minutes Intravenous Every 24 hours 03/01/16 1312     02/29/16 0600  ciprofloxacin (CIPRO) IVPB 400 mg  Status:  Discontinued     400 mg 200 mL/hr over 60 Minutes Intravenous On call to O.R. 02/28/16 1731 02/29/16 0603   02/29/16 0200  vancomycin (VANCOCIN) IVPB 1000 mg/200 mL premix  Status:  Discontinued     1,000 mg 200 mL/hr over 60 Minutes Intravenous Every 12 hours 02/28/16 1340 03/01/16 1156   02/28/16 2200  aztreonam (AZACTAM) 2 g in dextrose 5 % 50 mL IVPB  Status:  Discontinued     2 g 100 mL/hr over 30 Minutes Intravenous Every 8 hours 02/28/16 1340 02/28/16 1430   02/28/16 2200  aztreonam (AZACTAM) 2 g in dextrose 5 % 50 mL IVPB  Status:  Discontinued     2 g 100 mL/hr over 30 Minutes Intravenous Every 8 hours 02/28/16 2140 03/01/16 1156   02/28/16 1330  aztreonam (AZACTAM) 2 g in dextrose 5 % 50 mL IVPB     2 g 100 mL/hr over 30 Minutes Intravenous  Once 02/28/16 1317 02/28/16 1417   02/28/16 1330  vancomycin (VANCOCIN) IVPB 1000 mg/200 mL premix     1,000 mg 200 mL/hr over 60 Minutes Intravenous  Once 02/28/16 1317 02/28/16 1737      Assessment/Plan: s/p Procedure(s): RIGHT INCARCERATED INGUINAL HERNIA, (Right) by Dr Barry Dienes POD 4  Adv diet as tolerated  Leighton Ruff. Redmond Pulling, MD, FACS General, Bariatric, & Minimally Invasive Surgery [facility], [place]   LOS: 4 days    Allen Beasley 03/03/2016

## 2016-03-03 NOTE — Progress Notes (Signed)
PROGRESS NOTE    Allen Beasley  [id number] DOB: [birth year] DOA: 02/28/2016 PCP: Geoffery Lyons, MD    Brief Narrative:  82 year old male with COPD, aortic insufficiency, hypertension, hyperlipidemia, was recently admitted from 10/26-11/7/17 for sepsis and necrotizing pneumonia, was discharged with a 4-week course of Levaquin.  He returned to the ER with nausea, vomiting, abdominal distention, pain, and inability to have a BM.  CT scan ab/p demonstrated an incarcerated direct right inguinal hernia.  He underwent urgent primary repair by Dr. Barry Dienes on 11/18.  He was persistently hypotensive post-operatively with decreased uop and required multiple boluses and IVF.  On 11/20, he developed worsening respiratory distress and despite multiple doses of lasix, required nonrebreather.  CXR demonstrated worsening RLL pneumonia and patient was already on broad spectrum antibiotics.  Suspect aspiration pneumonia.  PCCM was consulted who recommended bipap with close monitoring in case of further respiratory compromise.  Despite his recent abdominal surgery, he tolerated bipap well and was changed to HFNC on 11/21.  Assessment & Plan:   Principal Problem:   Small bowel obstruction Active Problems:   COPD with emphysema (HCC)   HTN (hypertension)   Acute on chronic respiratory failure with hypoxia (HCC)   Esophageal reflux   Multifocal pneumonia   HCAP (healthcare-associated pneumonia)  SBO - secondary to incarcerated R inguinal hernia and now s/p surgical repair by Dr. Barry Dienes on 02/28/16 - Pos BS. Pt is tolerating PO intake - Surgery is following - continue diet as tolerated  Acute on chronic respiratory failure due to COPD with emphysema (HCC) and recent pneumonia with probable superimposed aspiration pneumonia - CXR reviewed with findings suggestive of RLL - Patient is using IS and flutter valve - Patient remains on high flow O2, with slowly decreasing O2 requirements - PCCM following.  Recommendations noted. Recs for total 10 days of antibiotics, as needed lasix as kidneys/blood pressure tolerates  HTN (hypertension),  - vital signs reviewed, BP remains stable - BP medications remain on hold  Esophageal reflux,  - Remains stable at present - Patient denies chest/epigastric pain  Hypokalemia due to diuresis - Potassium low at 3.3 today. Labs reviewed - Will replace potassium  Normocytic anemia - Hemoglobin stable overnight at 10.5 -  TSH within normal limits at 0.909 -  Occult stool was ordered, pending results -  recheck CBC in AM  DVT prophylaxis: Heparin subQ Code Status: Full Family Communication: Pt in room, family not at bedside Disposition Plan: SNF when medically stable, timing uncertain   Consultants:   Pulmonary  General Surgery  Procedures:   Surgical repair of incarcerated hernia  Antimicrobials: Anti-infectives    Start     Dose/Rate Route Frequency Ordered Stop   03/01/16 1400  levofloxacin (LEVAQUIN) IVPB 750 mg     750 mg 100 mL/hr over 90 Minutes Intravenous Every 24 hours 03/01/16 1312     02/29/16 0600  ciprofloxacin (CIPRO) IVPB 400 mg  Status:  Discontinued     400 mg 200 mL/hr over 60 Minutes Intravenous On call to O.R. 02/28/16 1731 02/29/16 0603   02/29/16 0200  vancomycin (VANCOCIN) IVPB 1000 mg/200 mL premix  Status:  Discontinued     1,000 mg 200 mL/hr over 60 Minutes Intravenous Every 12 hours 02/28/16 1340 03/01/16 1156   02/28/16 2200  aztreonam (AZACTAM) 2 g in dextrose 5 % 50 mL IVPB  Status:  Discontinued     2 g 100 mL/hr over 30 Minutes Intravenous Every 8 hours 02/28/16 1340 02/28/16 1430  02/28/16 2200  aztreonam (AZACTAM) 2 g in dextrose 5 % 50 mL IVPB  Status:  Discontinued     2 g 100 mL/hr over 30 Minutes Intravenous Every 8 hours 02/28/16 2140 03/01/16 1156   02/28/16 1330  aztreonam (AZACTAM) 2 g in dextrose 5 % 50 mL IVPB     2 g 100 mL/hr over 30 Minutes Intravenous  Once 02/28/16 1317 02/28/16  1417   02/28/16 1330  vancomycin (VANCOCIN) IVPB 1000 mg/200 mL premix     1,000 mg 200 mL/hr over 60 Minutes Intravenous  Once 02/28/16 1317 02/28/16 1737       Subjective: Eager to go home.  Objective: Vitals:   03/03/16 0900 03/03/16 1000 03/03/16 1120 03/03/16 1151  BP: (!) 131/39 (!) 131/57    Pulse: 86 85    Resp: (!) 21 (!) 26    Temp:    98.5 F (36.9 C)  TempSrc:    Oral  SpO2: (!) 88% 97% 95%   Weight:      Height:        Intake/Output Summary (Last 24 hours) at 03/03/16 1316 Last data filed at 03/03/16 0600  Gross per 24 hour  Intake              655 ml  Output              935 ml  Net             -280 ml   Filed Weights   02/28/16 1241 02/29/16 0000  Weight: 83.9 kg (185 lb) 85.2 kg (187 lb 13.3 oz)    Examination:  General exam: Appears calm and comfortable  Respiratory system: Clear to auscultation. Respiratory effort normal. Cardiovascular system: S1 & S2 heard, RRR.  Gastrointestinal system: Abdomen is nondistended, soft and nontender. No organomegaly or masses felt. Normal bowel sounds heard. Central nervous system: Alert and oriented. No focal neurological deficits. Extremities: Symmetric 5 x 5 power. Skin: No rashes, lesions Psychiatry: Judgement and insight appear normal. Mood & affect appropriate.   Data Reviewed: I have personally reviewed following labs and imaging studies  CBC:  Recent Labs Lab 02/28/16 1058 02/29/16 0500 03/01/16 0806 03/02/16 0307 03/03/16 0304  WBC 19.8* 15.2* 10.7* 8.2 5.3  HGB 13.5 11.0* 11.2* 10.5* 10.5*  HCT 40.7 34.7* 36.2* 34.2* 33.9*  MCV 87.5 91.6 92.1 91.4 92.6  PLT 169 138* 139* 147* [id number]   Basic Metabolic Panel:  Recent Labs Lab 02/28/16 1058 02/29/16 0500 03/01/16 0806 03/02/16 0307 03/03/16 0304  NA 136 140 141 140 141  K 3.9 3.9 3.7 3.2* 3.3*  CL 97* 103 106 99* 97*  CO2 29 32 30 35* 38*  GLUCOSE 188* 137* 100* 115* 114*  BUN 22* 24* 30* 25* 22*  CREATININE 0.83 0.77 0.60* 0.69  0.54*  CALCIUM 9.4 7.9* 7.9* 7.9* 8.0*   GFR: Estimated Creatinine Clearance: 71.4 mL/min (by C-G formula based on SCr of 0.54 mg/dL (L)). Liver Function Tests:  Recent Labs Lab 02/28/16 1058  AST 22  ALT 14*  ALKPHOS 51  BILITOT 1.3*  PROT 6.9  ALBUMIN 3.3*   No results for input(s): LIPASE, AMYLASE in the last 168 hours. No results for input(s): AMMONIA in the last 168 hours. Coagulation Profile: No results for input(s): INR, PROTIME in the last 168 hours. Cardiac Enzymes: No results for input(s): CKTOTAL, CKMB, CKMBINDEX, TROPONINI in the last 168 hours. BNP (last 3 results) No results for input(s): PROBNP in the last 8760  hours. HbA1C: No results for input(s): HGBA1C in the last 72 hours. CBG: No results for input(s): GLUCAP in the last 168 hours. Lipid Profile: No results for input(s): CHOL, HDL, LDLCALC, TRIG, CHOLHDL, LDLDIRECT in the last 72 hours. Thyroid Function Tests:  Recent Labs  03/03/16 0304  TSH 0.909   Anemia Panel:  Recent Labs  03/03/16 0304  VITAMINB12 632  FOLATE 9.0  FERRITIN 447*  TIBC 115*  IRON 41*   Sepsis Labs:  Recent Labs Lab 02/28/16 1250  LATICACIDVEN 1.50    Recent Results (from the past 240 hour(s))  Blood culture (routine x 2)     Status: None (Preliminary result)   Collection Time: 02/28/16 12:29 PM  Result Value Ref Range Status   Specimen Description BLOOD BLOOD LEFT FOREARM  Final   Special Requests BOTTLES DRAWN AEROBIC AND ANAEROBIC 5CC  Final   Culture   Final    NO GROWTH 3 DAYS Performed at [facility]    Report Status PENDING  Incomplete  Blood culture (routine x 2)     Status: None (Preliminary result)   Collection Time: 02/28/16 12:39 PM  Result Value Ref Range Status   Specimen Description BLOOD BLOOD RIGHT FOREARM  Final   Special Requests BOTTLES DRAWN AEROBIC AND ANAEROBIC 5CC  Final   Culture   Final    NO GROWTH 3 DAYS Performed at [facility]    Report Status PENDING   Incomplete  Urine culture     Status: Abnormal   Collection Time: 02/28/16 12:55 PM  Result Value Ref Range Status   Specimen Description URINE, CATHETERIZED  Final   Special Requests NONE  Final   Culture (A)  Final    30,000 COLONIES/mL STAPHYLOCOCCUS SPECIES (COAGULASE NEGATIVE)   Report Status 03/02/2016 FINAL  Final   Organism ID, Bacteria STAPHYLOCOCCUS SPECIES (COAGULASE NEGATIVE) (A)  Final      Susceptibility   Staphylococcus species (coagulase negative) - MIC*    CIPROFLOXACIN >=8 RESISTANT Resistant     GENTAMICIN 8 INTERMEDIATE Intermediate     NITROFURANTOIN <=16 SENSITIVE Sensitive     OXACILLIN >=4 RESISTANT Resistant     TETRACYCLINE >=16 RESISTANT Resistant     VANCOMYCIN 1 SENSITIVE Sensitive     TRIMETH/SULFA 80 RESISTANT Resistant     CLINDAMYCIN >=8 RESISTANT Resistant     RIFAMPIN <=0.5 SENSITIVE Sensitive     Inducible Clindamycin NEGATIVE Sensitive     * 30,000 COLONIES/mL STAPHYLOCOCCUS SPECIES (COAGULASE NEGATIVE)  MRSA PCR Screening     Status: None   Collection Time: 02/28/16 10:44 PM  Result Value Ref Range Status   MRSA by PCR NEGATIVE NEGATIVE Final    Comment:        The GeneXpert MRSA Assay (FDA approved for NASAL specimens only), is one component of a comprehensive MRSA colonization surveillance program. It is not intended to diagnose MRSA infection nor to guide or monitor treatment for MRSA infections.   Culture, sputum-assessment     Status: None   Collection Time: 03/02/16  8:00 PM  Result Value Ref Range Status   Specimen Description EXPECTORATED SPUTUM  Final   Special Requests NONE  Final   Sputum evaluation   Final    THIS SPECIMEN IS ACCEPTABLE. RESPIRATORY CULTURE REPORT TO FOLLOW.   Report Status 03/02/2016 FINAL  Final  Culture, respiratory (NON-Expectorated)     Status: None (Preliminary result)   Collection Time: 03/02/16  8:00 PM  Result Value Ref Range Status  Specimen Description SPUTUM  Final   Special Requests NONE   Final   Gram Stain   Final    ABUNDANT WBC PRESENT,BOTH PMN AND MONONUCLEAR FEW YEAST WITH PSEUDOHYPHAE RARE GRAM POSITIVE RODS    Culture   Final    CULTURE REINCUBATED FOR BETTER GROWTH Performed at [facility]    Report Status PENDING  Incomplete     Radiology Studies: No results found.  Scheduled Meds: . heparin  5,000 Units Subcutaneous Q8H  . ipratropium-albuterol  3 mL Nebulization TID  . levofloxacin (LEVAQUIN) IV  750 mg Intravenous Q24H  . mouth rinse  15 mL Mouth Rinse BID  . pantoprazole (PROTONIX) IV  40 mg Intravenous Q12H  . sodium chloride  1,000 mL Intravenous Once   Continuous Infusions:   LOS: 4 days   CHIU, Orpah Melter, MD [facility] Pager [phone]  If 7PM-7AM, please contact night-coverage [email or web address] Password [email or web address] 03/03/2016, 1:16 PM

## 2016-03-04 DIAGNOSIS — J438 Other emphysema: Secondary | ICD-10-CM

## 2016-03-04 DIAGNOSIS — K4030 Unilateral inguinal hernia, with obstruction, without gangrene, not specified as recurrent: Principal | ICD-10-CM

## 2016-03-04 DIAGNOSIS — K219 Gastro-esophageal reflux disease without esophagitis: Secondary | ICD-10-CM

## 2016-03-04 LAB — CBC
HCT: 33.6 % — ABNORMAL LOW (ref 39.0–52.0)
HEMOGLOBIN: 10.4 g/dL — AB (ref 13.0–17.0)
MCH: 28.7 pg (ref 26.0–34.0)
MCHC: 31 g/dL (ref 30.0–36.0)
MCV: 92.8 fL (ref 78.0–100.0)
Platelets: 176 10*3/uL (ref 150–400)
RBC: 3.62 MIL/uL — ABNORMAL LOW (ref 4.22–5.81)
RDW: 14.9 % (ref 11.5–15.5)
WBC: 5.9 10*3/uL (ref 4.0–10.5)

## 2016-03-04 LAB — BASIC METABOLIC PANEL
ANION GAP: 4 — AB (ref 5–15)
BUN: 19 mg/dL (ref 6–20)
CALCIUM: 7.9 mg/dL — AB (ref 8.9–10.3)
CO2: 37 mmol/L — AB (ref 22–32)
CREATININE: 0.55 mg/dL — AB (ref 0.61–1.24)
Chloride: 101 mmol/L (ref 101–111)
GFR calc Af Amer: >60 mL/min (ref >60)
GFR calc non Af Amer: >60 mL/min (ref >60)
GLUCOSE: 107 mg/dL — AB (ref 65–99)
Potassium: 3.8 mmol/L (ref 3.5–5.1)
Sodium: 142 mmol/L (ref 135–145)

## 2016-03-04 LAB — CULTURE, BLOOD (ROUTINE X 2)
CULTURE: NO GROWTH
CULTURE: NO GROWTH

## 2016-03-04 LAB — URINE CULTURE: Culture: NO GROWTH

## 2016-03-04 MED ORDER — METHYLPREDNISOLONE SODIUM SUCC 125 MG IJ SOLR
60.0000 mg | Freq: Four times a day (QID) | INTRAMUSCULAR | Status: DC
  Administered 2016-03-04 – 2016-03-09 (×22): 60 mg via INTRAVENOUS
  Filled 2016-03-04 (×21): qty 2

## 2016-03-04 MED ORDER — POLYETHYLENE GLYCOL 3350 17 G PO PACK
17.0000 g | PACK | Freq: Every day | ORAL | Status: DC
  Administered 2016-03-04 – 2016-03-09 (×5): 17 g via ORAL
  Filled 2016-03-04 (×5): qty 1

## 2016-03-04 MED ORDER — PANTOPRAZOLE SODIUM 40 MG PO TBEC
40.0000 mg | DELAYED_RELEASE_TABLET | Freq: Two times a day (BID) | ORAL | Status: DC
  Administered 2016-03-04 – 2016-03-09 (×10): 40 mg via ORAL
  Filled 2016-03-04 (×10): qty 1

## 2016-03-04 NOTE — Progress Notes (Signed)
5 Days Post-Op  Subjective: Tolerating diet. No n.v. +Flatus no BM  Objective: Vital signs in last 24 hours: Temp:  [97.7 F (36.5 C)-98.8 F (37.1 C)] 98.8 F (37.1 C) (11/23 0800) Pulse Rate:  [70-93] 82 (11/23 0800) Resp:  [14-32] 17 (11/23 0800) BP: (100-142)/(21-78) 126/54 (11/23 0800) SpO2:  [89 %-97 %] 94 % (11/23 0811) FiO2 (%):  [80 %-100 %] 80 % (11/23 0812) Last BM Date: 03/03/16  Intake/Output from previous day: 11/22 0701 - 11/23 0700 In: 150 [IV Piggyback:150] Out: 725 [Urine:725] Intake/Output this shift: No intake/output data recorded.  Alert, reading paper Soft, nt, nd, incision c/d/i  Lab Results:   Recent Labs  03/03/16 0304 03/04/16 0304  WBC 5.3 5.9  HGB 10.5* 10.4*  HCT 33.9* 33.6*  PLT 165 176   BMET  Recent Labs  03/03/16 0304 03/04/16 0304  NA 141 142  K 3.3* 3.8  CL 97* 101  CO2 38* 37*  GLUCOSE 114* 107*  BUN 22* 19  CREATININE 0.54* 0.55*  CALCIUM 8.0* 7.9*   PT/INR No results for input(s): LABPROT, INR in the last 72 hours. ABG No results for input(s): PHART, HCO3 in the last 72 hours.  Invalid input(s): PCO2, PO2  Studies/Results: No results found.  Anti-infectives: Anti-infectives    Start     Dose/Rate Route Frequency Ordered Stop   03/01/16 1400  levofloxacin (LEVAQUIN) IVPB 750 mg     750 mg 100 mL/hr over 90 Minutes Intravenous Every 24 hours 03/01/16 1312     02/29/16 0600  ciprofloxacin (CIPRO) IVPB 400 mg  Status:  Discontinued     400 mg 200 mL/hr over 60 Minutes Intravenous On call to O.R. 02/28/16 1731 02/29/16 0603   02/29/16 0200  vancomycin (VANCOCIN) IVPB 1000 mg/200 mL premix  Status:  Discontinued     1,000 mg 200 mL/hr over 60 Minutes Intravenous Every 12 hours 02/28/16 1340 03/01/16 1156   02/28/16 2200  aztreonam (AZACTAM) 2 g in dextrose 5 % 50 mL IVPB  Status:  Discontinued     2 g 100 mL/hr over 30 Minutes Intravenous Every 8 hours 02/28/16 1340 02/28/16 1430   02/28/16 2200  aztreonam  (AZACTAM) 2 g in dextrose 5 % 50 mL IVPB  Status:  Discontinued     2 g 100 mL/hr over 30 Minutes Intravenous Every 8 hours 02/28/16 2140 03/01/16 1156   02/28/16 1330  aztreonam (AZACTAM) 2 g in dextrose 5 % 50 mL IVPB     2 g 100 mL/hr over 30 Minutes Intravenous  Once 02/28/16 1317 02/28/16 1417   02/28/16 1330  vancomycin (VANCOCIN) IVPB 1000 mg/200 mL premix     1,000 mg 200 mL/hr over 60 Minutes Intravenous  Once 02/28/16 1317 02/28/16 1737      Assessment/Plan: s/p Procedure(s): RIGHT INCARCERATED INGUINAL HERNIA, (Right) by Dr Barry Dienes  Doing well from GI standpoint. Add daily laxative Staples need to come out around POD 12-14 F/u Dr Barry Dienes at [facility] 2-4 weeks after discharge.  Will see PRN over weekend.  pls call with questions.   Leighton Ruff. Redmond Pulling, MD, FACS General, Bariatric, & Minimally Invasive Surgery [facility], [place]    LOS: 5 days    Gayland Curry 03/04/2016

## 2016-03-04 NOTE — Progress Notes (Signed)
PROGRESS NOTE    Allen Beasley  [id number] DOB: 1923-05-11 DOA: 02/28/2016 PCP: Geoffery Lyons, MD    Brief Narrative:  82 year old male with COPD, aortic insufficiency, hypertension, hyperlipidemia, was recently admitted from 10/26-11/7/17 for sepsis and necrotizing pneumonia, was discharged with a 4-week course of Levaquin.  He returned to the ER with nausea, vomiting, abdominal distention, pain, and inability to have a BM.  CT scan ab/p demonstrated an incarcerated direct right inguinal hernia.  He underwent urgent primary repair by Dr. Barry Dienes on 11/18.  He was persistently hypotensive post-operatively with decreased uop and required multiple boluses and IVF.  On 11/20, he developed worsening respiratory distress and despite multiple doses of lasix, required nonrebreather.  CXR demonstrated worsening RLL pneumonia and patient was already on broad spectrum antibiotics.  Suspect aspiration pneumonia.  PCCM was consulted who recommended bipap with close monitoring in case of further respiratory compromise.  Despite his recent abdominal surgery, he tolerated bipap well and was changed to HFNC on 11/21.  Assessment & Plan:   Principal Problem:   Incarcerated right inguinal hernia s/p primary repair 02/28/2016 Active Problems:   COPD with emphysema (HCC)   HTN (hypertension)   Acute on chronic respiratory failure with hypoxia (HCC)   Esophageal reflux   SBO (small bowel obstruction)   Multifocal pneumonia   HCAP (healthcare-associated pneumonia)  SBO - secondary to incarcerated R inguinal hernia and now s/p surgical repair by Dr. Barry Dienes on 02/28/16 - Patient is tolerating diet without difficulty -Positive bowel sounds on exam -Surgery is following  Acute on chronic respiratory failure due to COPD with emphysema ([place]) and recent pneumonia with probable superimposed aspiration pneumonia - CXR reviewed with findings suggestive of RLL - Continue NSAIDs from tree and flutter valve as  tolerated - PCCM had recommended for total 10 days of antibiotics, as needed lasix as kidneys/blood pressure tolerates -Patient remains on high flow to, albeit improved -Audible wheezing on exam today. Will add IV Solu-Medrol  HTN (hypertension),  -Low pressure stable currently - BP medications remain on hold  Esophageal reflux,  - Remains stable at present - Recent hematocrit present  Hypokalemia due to diuresis - Normalized today. Labs reviewed  Normocytic anemia - Hemoglobin remained stable. Currently 10.4 -  TSH within normal limits at 0.90 -   no signs for active bleeding at this time. We'll recheck CBC in the morning  DVT prophylaxis: Heparin subQ Code Status: Full Family Communication: Pt in room, family not at bedside Disposition Plan: SNF when medically stable, timing uncertain   Consultants:   Pulmonary  General Surgery  Procedures:   Surgical repair of incarcerated hernia  Antimicrobials: Anti-infectives    Start     Dose/Rate Route Frequency Ordered Stop   03/01/16 1400  levofloxacin (LEVAQUIN) IVPB 750 mg     750 mg 100 mL/hr over 90 Minutes Intravenous Every 24 hours 03/01/16 1312     02/29/16 0600  ciprofloxacin (CIPRO) IVPB 400 mg  Status:  Discontinued     400 mg 200 mL/hr over 60 Minutes Intravenous On call to O.R. 02/28/16 1731 02/29/16 0603   02/29/16 0200  vancomycin (VANCOCIN) IVPB 1000 mg/200 mL premix  Status:  Discontinued     1,000 mg 200 mL/hr over 60 Minutes Intravenous Every 12 hours 02/28/16 1340 03/01/16 1156   02/28/16 2200  aztreonam (AZACTAM) 2 g in dextrose 5 % 50 mL IVPB  Status:  Discontinued     2 g 100 mL/hr over 30 Minutes Intravenous Every 8  hours 02/28/16 1340 02/28/16 1430   02/28/16 2200  aztreonam (AZACTAM) 2 g in dextrose 5 % 50 mL IVPB  Status:  Discontinued     2 g 100 mL/hr over 30 Minutes Intravenous Every 8 hours 02/28/16 2140 03/01/16 1156   02/28/16 1330  aztreonam (AZACTAM) 2 g in dextrose 5 % 50 mL IVPB      2 g 100 mL/hr over 30 Minutes Intravenous  Once 02/28/16 1317 02/28/16 1417   02/28/16 1330  vancomycin (VANCOCIN) IVPB 1000 mg/200 mL premix     1,000 mg 200 mL/hr over 60 Minutes Intravenous  Once 02/28/16 1317 02/28/16 1737      Subjective: No complaints at present. Patient does report some improvement  Objective: Vitals:   03/04/16 1500 03/04/16 1600 03/04/16 1700 03/04/16 1800  BP: (!) 122/59 133/60 (!) 141/62 130/60  Pulse: 91 98 91 82  Resp: (!) 34 (!) 26    Temp:  98.6 F (37 C)    TempSrc:  Axillary    SpO2: 91% (!) 89% 93% 94%  Weight:      Height:        Intake/Output Summary (Last 24 hours) at 03/04/16 1909 Last data filed at 03/04/16 0500  Gross per 24 hour  Intake              150 ml  Output              450 ml  Net             -300 ml   Filed Weights   02/28/16 1241 02/29/16 0000  Weight: 83.9 kg (185 lb) 85.2 kg (187 lb 13.3 oz)    Examination:  General exam: Sitting in bed, eating breakfast Respiratory system: Normal respiratory effort, and respiratory wheezing auscultated in all lung fields Cardiovascular system: Regular rate, S1-S2 Gastrointestinal system: Positive bowel sounds, soft Central nervous system: CN II through XII grossly intact, sensation intact throughout Extremities: Perfused, no clubbing Skin: Normal skin turgor, no notable skin lesions seen Psychiatry: Mood normal, no visual hallucinations  Data Reviewed: I have personally reviewed following labs and imaging studies  CBC:  Recent Labs Lab 02/29/16 0500 03/01/16 0806 03/02/16 0307 03/03/16 0304 03/04/16 0304  WBC 15.2* 10.7* 8.2 5.3 5.9  HGB 11.0* 11.2* 10.5* 10.5* 10.4*  HCT 34.7* 36.2* 34.2* 33.9* 33.6*  MCV 91.6 92.1 91.4 92.6 92.8  PLT 138* 139* 147* 165 0000000   Basic Metabolic Panel:  Recent Labs Lab 02/29/16 0500 03/01/16 0806 03/02/16 0307 03/03/16 0304 03/04/16 0304  NA 140 141 140 141 142  K 3.9 3.7 3.2* 3.3* 3.8  CL 103 106 99* 97* 101  CO2 32 30  35* 38* 37*  GLUCOSE 137* 100* 115* 114* 107*  BUN 24* 30* 25* 22* 19  CREATININE 0.77 0.60* 0.69 0.54* 0.55*  CALCIUM 7.9* 7.9* 7.9* 8.0* 7.9*   GFR: Estimated Creatinine Clearance: 71.4 mL/min (by C-G formula based on SCr of 0.55 mg/dL (L)). Liver Function Tests:  Recent Labs Lab 02/28/16 1058  AST 22  ALT 14*  ALKPHOS 51  BILITOT 1.3*  PROT 6.9  ALBUMIN 3.3*   No results for input(s): LIPASE, AMYLASE in the last 168 hours. No results for input(s): AMMONIA in the last 168 hours. Coagulation Profile: No results for input(s): INR, PROTIME in the last 168 hours. Cardiac Enzymes: No results for input(s): CKTOTAL, CKMB, CKMBINDEX, TROPONINI in the last 168 hours. BNP (last 3 results) No results for input(s): PROBNP in the  last 8760 hours. HbA1C: No results for input(s): HGBA1C in the last 72 hours. CBG: No results for input(s): GLUCAP in the last 168 hours. Lipid Profile: No results for input(s): CHOL, HDL, LDLCALC, TRIG, CHOLHDL, LDLDIRECT in the last 72 hours. Thyroid Function Tests:  Recent Labs  03/03/16 0304  TSH 0.909   Anemia Panel:  Recent Labs  03/03/16 0304  VITAMINB12 632  FOLATE 9.0  FERRITIN 447*  TIBC 115*  IRON 41*   Sepsis Labs:  Recent Labs Lab 02/28/16 1250  LATICACIDVEN 1.50    Recent Results (from the past 240 hour(s))  Blood culture (routine x 2)     Status: None   Collection Time: 02/28/16 12:29 PM  Result Value Ref Range Status   Specimen Description BLOOD BLOOD LEFT FOREARM  Final   Special Requests BOTTLES DRAWN AEROBIC AND ANAEROBIC 5CC  Final   Culture   Final    NO GROWTH 5 DAYS Performed at [facility]    Report Status 03/04/2016 FINAL  Final  Blood culture (routine x 2)     Status: None   Collection Time: 02/28/16 12:39 PM  Result Value Ref Range Status   Specimen Description BLOOD BLOOD RIGHT FOREARM  Final   Special Requests BOTTLES DRAWN AEROBIC AND ANAEROBIC 5CC  Final   Culture   Final    NO GROWTH 5  DAYS Performed at [facility]    Report Status 03/04/2016 FINAL  Final  Urine culture     Status: Abnormal   Collection Time: 02/28/16 12:55 PM  Result Value Ref Range Status   Specimen Description URINE, CATHETERIZED  Final   Special Requests NONE  Final   Culture (A)  Final    30,000 COLONIES/mL STAPHYLOCOCCUS SPECIES (COAGULASE NEGATIVE)   Report Status 03/02/2016 FINAL  Final   Organism ID, Bacteria STAPHYLOCOCCUS SPECIES (COAGULASE NEGATIVE) (A)  Final      Susceptibility   Staphylococcus species (coagulase negative) - MIC*    CIPROFLOXACIN >=8 RESISTANT Resistant     GENTAMICIN 8 INTERMEDIATE Intermediate     NITROFURANTOIN <=16 SENSITIVE Sensitive     OXACILLIN >=4 RESISTANT Resistant     TETRACYCLINE >=16 RESISTANT Resistant     VANCOMYCIN 1 SENSITIVE Sensitive     TRIMETH/SULFA 80 RESISTANT Resistant     CLINDAMYCIN >=8 RESISTANT Resistant     RIFAMPIN <=0.5 SENSITIVE Sensitive     Inducible Clindamycin NEGATIVE Sensitive     * 30,000 COLONIES/mL STAPHYLOCOCCUS SPECIES (COAGULASE NEGATIVE)  MRSA PCR Screening     Status: None   Collection Time: 02/28/16 10:44 PM  Result Value Ref Range Status   MRSA by PCR NEGATIVE NEGATIVE Final    Comment:        The GeneXpert MRSA Assay (FDA approved for NASAL specimens only), is one component of a comprehensive MRSA colonization surveillance program. It is not intended to diagnose MRSA infection nor to guide or monitor treatment for MRSA infections.   Culture, sputum-assessment     Status: None   Collection Time: 03/02/16  8:00 PM  Result Value Ref Range Status   Specimen Description EXPECTORATED SPUTUM  Final   Special Requests NONE  Final   Sputum evaluation   Final    THIS SPECIMEN IS ACCEPTABLE. RESPIRATORY CULTURE REPORT TO FOLLOW.   Report Status 03/02/2016 FINAL  Final  Culture, respiratory (NON-Expectorated)     Status: None (Preliminary result)   Collection Time: 03/02/16  8:00 PM  Result Value Ref  Range Status  Specimen Description SPUTUM  Final   Special Requests NONE  Final   Gram Stain   Final    ABUNDANT WBC PRESENT,BOTH PMN AND MONONUCLEAR FEW YEAST WITH PSEUDOHYPHAE RARE GRAM POSITIVE RODS    Culture   Final    CULTURE REINCUBATED FOR BETTER GROWTH Performed at [facility]    Report Status PENDING  Incomplete  Urine culture     Status: None   Collection Time: 03/03/16  2:30 AM  Result Value Ref Range Status   Specimen Description URINE, CATHETERIZED  Final   Special Requests NONE  Final   Culture NO GROWTH Performed at [facility]   Final   Report Status 03/04/2016 FINAL  Final     Radiology Studies: No results found.  Scheduled Meds: . heparin  5,000 Units Subcutaneous Q8H  . ipratropium-albuterol  3 mL Nebulization TID  . levofloxacin (LEVAQUIN) IV  750 mg Intravenous Q24H  . mouth rinse  15 mL Mouth Rinse BID  . methylPREDNISolone (SOLU-MEDROL) injection  60 mg Intravenous Q6H  . pantoprazole  40 mg Oral BID AC  . polyethylene glycol  17 g Oral Daily  . sodium chloride  1,000 mL Intravenous Once   Continuous Infusions:   LOS: 5 days   Mazzy Santarelli, Orpah Melter, MD [facility] Pager [phone]  If 7PM-7AM, please contact night-coverage [email or web address] Password [email or web address] 03/04/2016, 7:09 PM

## 2016-03-04 NOTE — Progress Notes (Signed)
Key Points: Use following P&T approved IV to PO antibiotic change policy.  Description contains the criteria that are approved Note: Policy Excludes:  Esophagectomy patientsPHARMACIST - PHYSICIAN COMMUNICATION DR:   Wyline Copas CONCERNING: IV to Oral Route Change Policy  RECOMMENDATION: This patient is receiving Protonix by the intravenous route.  Based on criteria approved by the Pharmacy and Therapeutics Committee, the intravenous medication(s) is/are being converted to the equivalent oral dose form(s).   DESCRIPTION: These criteria include:  The patient is eating (either orally or via tube) and/or has been taking other orally administered medications for a least 24 hours  The patient has no evidence of active gastrointestinal bleeding or impaired GI absorption (gastrectomy, short bowel, patient on TNA or NPO).  If you have questions about this conversion, please contact the Pharmacy Department  []   [phone] )  [facility] []   [phone] )  [facility] []   [phone] )  [facility] []   [phone] )  [facility] [x]   [phone] )  [place], [place], [facility] 03/04/2016 10:46 AM

## 2016-03-05 ENCOUNTER — Inpatient Hospital Stay (HOSPITAL_COMMUNITY): Payer: Medicare Other

## 2016-03-05 LAB — CBC
HEMATOCRIT: 33.7 % — AB (ref 39.0–52.0)
HEMOGLOBIN: 10.4 g/dL — AB (ref 13.0–17.0)
MCH: 27.9 pg (ref 26.0–34.0)
MCHC: 30.9 g/dL (ref 30.0–36.0)
MCV: 90.3 fL (ref 78.0–100.0)
Platelets: 192 10*3/uL (ref 150–400)
RBC: 3.73 MIL/uL — AB (ref 4.22–5.81)
RDW: 14.5 % (ref 11.5–15.5)
WBC: 5.4 10*3/uL (ref 4.0–10.5)

## 2016-03-05 LAB — BASIC METABOLIC PANEL
ANION GAP: 4 — AB (ref 5–15)
BUN: 24 mg/dL — ABNORMAL HIGH (ref 6–20)
CHLORIDE: 99 mmol/L — AB (ref 101–111)
CO2: 35 mmol/L — AB (ref 22–32)
Calcium: 8 mg/dL — ABNORMAL LOW (ref 8.9–10.3)
Creatinine, Ser: 0.66 mg/dL (ref 0.61–1.24)
GFR calc Af Amer: >60 mL/min (ref >60)
GFR calc non Af Amer: >60 mL/min (ref >60)
Glucose, Bld: 202 mg/dL — ABNORMAL HIGH (ref 65–99)
Potassium: 4.6 mmol/L (ref 3.5–5.1)
Sodium: 138 mmol/L (ref 135–145)

## 2016-03-05 LAB — CULTURE, RESPIRATORY W GRAM STAIN

## 2016-03-05 LAB — CULTURE, RESPIRATORY: CULTURE: NORMAL

## 2016-03-05 NOTE — Progress Notes (Signed)
Date:  March 05, 2016 Chart reviewed for concurrent status and case management needs. Will continue to follow patient progress. hfnc at 80% Discharge Planning: following for needs Expected discharge date: [id number] [name], BSN, [place], [place]

## 2016-03-05 NOTE — Progress Notes (Signed)
Physical Therapy Treatment Patient Details Name: Allen Beasley MRN: [id number] DOB: Nov 30, 1923 Today's Date: 2016-03-15    History of Present Illness [name] is a 82 y.o. male with medical history significant of COPD/emphysema, HTN, HMD, aortic insufficiency; who presents 02/28/16 with complaints of abdominal  cramps, Nausea and vomiting. Patient was recently admitted from 10/26-11/7/17 for sepsis and pneumonia. repair of incarcerated hernia on 11/18.    PT Comments    Pt continues motivated and progressing slowly with mobility but continues to fatigue easily.  Follow Up Recommendations  SNF;Supervision/Assistance - 24 hour     Equipment Recommendations  None recommended by PT    Recommendations for Other Services       Precautions / Restrictions Precautions Precautions: Fall Precaution Comments: On hiflo O2 Restrictions Weight Bearing Restrictions: No    Mobility  Bed Mobility Overal bed mobility: Needs Assistance Bed Mobility: Supine to Sit     Supine to sit: Min assist;HOB elevated     General bed mobility comments: increased time  Transfers Overall transfer level: Needs assistance Equipment used: Rolling walker (2 wheeled) Transfers: Sit to/from Stand Sit to Stand: Min assist;+2 safety/equipment;From elevated surface         General transfer comment: cues for use of UEs  Ambulation/Gait Ambulation/Gait assistance: Min assist;+2 safety/equipment Ambulation Distance (Feet): 44 Feet Assistive device: Rolling walker (2 wheeled) Gait Pattern/deviations: Step-to pattern;Step-through pattern;Decreased step length - left;Decreased step length - right;Shuffle;Trunk flexed Gait velocity: decreased Gait velocity interpretation: Below normal speed for age/gender General Gait Details: several standing rest breaks to maintain O2 sats above 88%.   Stairs            Wheelchair Mobility    Modified Rankin (Stroke Patients Only)       Balance                                    Cognition Arousal/Alertness: Awake/alert Behavior During Therapy: WFL for tasks assessed/performed Overall Cognitive Status: Within Functional Limits for tasks assessed                      Exercises      General Comments        Pertinent Vitals/Pain Pain Assessment: No/denies pain    Home Living                      Prior Function            PT Goals (current goals can now be found in the care plan section) Acute Rehab PT Goals Patient Stated Goal: walk PT Goal Formulation: With patient Time For Goal Achievement: 03/14/16 Potential to Achieve Goals: Good Progress towards PT goals: Progressing toward goals    Frequency    Min 3X/week      PT Plan Current plan remains appropriate    Co-evaluation             End of Session Equipment Utilized During Treatment: Gait belt;Oxygen Activity Tolerance: Patient limited by fatigue Patient left: in chair;with call bell/phone within reach;with chair alarm set;with family/visitor present     Time: [id number] PT Time Calculation (min) (ACUTE ONLY): 26 min  Charges:  $Gait Training: 23-37 mins                    G Codes:      Riker Collier March 15, 2016, 2:35 PM

## 2016-03-05 NOTE — Progress Notes (Signed)
PROGRESS NOTE    Allen Beasley  [id number] DOB: [birth year] DOA: 02/28/2016 PCP: Geoffery Lyons, MD    Brief Narrative:  82 year old male with COPD, aortic insufficiency, hypertension, hyperlipidemia, was recently admitted from 10/26-11/7/17 for sepsis and necrotizing pneumonia, was discharged with a 4-week course of Levaquin.  He returned to the ER with nausea, vomiting, abdominal distention, pain, and inability to have a BM.  CT scan ab/p demonstrated an incarcerated direct right inguinal hernia.  He underwent urgent primary repair by Dr. Barry Dienes on 11/18.  He was persistently hypotensive post-operatively with decreased uop and required multiple boluses and IVF.  On 11/20, he developed worsening respiratory distress and despite multiple doses of lasix, required nonrebreather.  CXR demonstrated worsening RLL pneumonia and patient was already on broad spectrum antibiotics.  Suspect aspiration pneumonia.  PCCM was consulted who recommended bipap with close monitoring in case of further respiratory compromise.  Despite his recent abdominal surgery, he tolerated bipap well and was changed to HFNC on 11/21.  Assessment & Plan:   Principal Problem:   Incarcerated right inguinal hernia s/p primary repair 02/28/2016 Active Problems:   COPD with emphysema (HCC)   HTN (hypertension)   Acute on chronic respiratory failure with hypoxia (HCC)   Esophageal reflux   SBO (small bowel obstruction)   Multifocal pneumonia   HCAP (healthcare-associated pneumonia)  SBO - secondary to incarcerated R inguinal hernia and now s/p surgical repair by Dr. Barry Dienes on 02/28/16 - Patient is tolerating diet without difficulty -Patient tolerating diet -Surgery continues to follow. Recs to follow up with Dr. Barry Dienes in 2-4 weeks post discharge  Acute on chronic respiratory failure due to COPD with emphysema ([place]) and recent pneumonia with probable superimposed aspiration pneumonia - CXR reviewed with findings  suggestive of RLL - Continue NSAIDs from tree and flutter valve as tolerated - PCCM had recommended for total 10 days of antibiotics, as needed lasix as kidneys/blood pressure tolerates -Patient continues on high flow O2. Plan to wean as tolerated -Obtained follow up cxr this AM with findings suggestive of multifocal pneumonia. Xray reviewed personally  HTN (hypertension),  -Low pressures noted this admission -Blood pressures have improved, vital signs reviewed -for now, BP meds remain on hold  Esophageal reflux,  - Remains stable at present -denies epigastric pain at present  Hypokalemia due to diuresis - Labs reviewed. - potassium normal  Normocytic anemia - Hemoglobin remained stable. Remains 10.4 -  TSH within normal limits at 0.90 -  recheck cbc in am  DVT prophylaxis: Heparin subQ Code Status: Full Family Communication: Pt in room, family not at bedside Disposition Plan: SNF when medically stable, timing uncertain   Consultants:   Pulmonary  General Surgery  Procedures:   Surgical repair of incarcerated hernia  Antimicrobials: Anti-infectives    Start     Dose/Rate Route Frequency Ordered Stop   03/01/16 1400  levofloxacin (LEVAQUIN) IVPB 750 mg     750 mg 100 mL/hr over 90 Minutes Intravenous Every 24 hours 03/01/16 1312     02/29/16 0600  ciprofloxacin (CIPRO) IVPB 400 mg  Status:  Discontinued     400 mg 200 mL/hr over 60 Minutes Intravenous On call to O.R. 02/28/16 1731 02/29/16 0603   02/29/16 0200  vancomycin (VANCOCIN) IVPB 1000 mg/200 mL premix  Status:  Discontinued     1,000 mg 200 mL/hr over 60 Minutes Intravenous Every 12 hours 02/28/16 1340 03/01/16 1156   02/28/16 2200  aztreonam (AZACTAM) 2 g in dextrose 5 % 50  mL IVPB  Status:  Discontinued     2 g 100 mL/hr over 30 Minutes Intravenous Every 8 hours 02/28/16 1340 02/28/16 1430   02/28/16 2200  aztreonam (AZACTAM) 2 g in dextrose 5 % 50 mL IVPB  Status:  Discontinued     2 g 100 mL/hr  over 30 Minutes Intravenous Every 8 hours 02/28/16 2140 03/01/16 1156   02/28/16 1330  aztreonam (AZACTAM) 2 g in dextrose 5 % 50 mL IVPB     2 g 100 mL/hr over 30 Minutes Intravenous  Once 02/28/16 1317 02/28/16 1417   02/28/16 1330  vancomycin (VANCOCIN) IVPB 1000 mg/200 mL premix     1,000 mg 200 mL/hr over 60 Minutes Intravenous  Once 02/28/16 1317 02/28/16 1737      Subjective: Without complaints  Objective: Vitals:   03/05/16 0926 03/05/16 1000 03/05/16 1200 03/05/16 1507  BP:  133/78    Pulse:  89    Resp:  17    Temp:   98 F (36.7 C)   TempSrc:   Oral   SpO2: (!) 88% (!) 88%  (!) 89%  Weight:      Height:        Intake/Output Summary (Last 24 hours) at 03/05/16 1525 Last data filed at 03/05/16 0700  Gross per 24 hour  Intake              150 ml  Output             1025 ml  Net             -875 ml   Filed Weights   02/28/16 1241 02/29/16 0000  Weight: 83.9 kg (185 lb) 85.2 kg (187 lb 13.3 oz)    Examination:  General exam: Laying in bed asleep,easily arousable, in nad Respiratory system: normal chest rise, trace crackles auscultated Cardiovascular system: Regular rhythm, s1, s2 Gastrointestinal system: nondistended, no masses Central nervous system: no seizures, no tremors Extremities: no joint deformities, without cyanosis Skin: no pallor, no rashes Psychiatry: affect normal, no auditory hallucinations  Data Reviewed: I have personally reviewed following labs and imaging studies  CBC:  Recent Labs Lab 03/01/16 0806 03/02/16 0307 03/03/16 0304 03/04/16 0304 03/05/16 0306  WBC 10.7* 8.2 5.3 5.9 5.4  HGB 11.2* 10.5* 10.5* 10.4* 10.4*  HCT 36.2* 34.2* 33.9* 33.6* 33.7*  MCV 92.1 91.4 92.6 92.8 90.3  PLT 139* 147* 165 176 [id number]   Basic Metabolic Panel:  Recent Labs Lab 03/01/16 0806 03/02/16 0307 03/03/16 0304 03/04/16 0304 03/05/16 0306  NA 141 140 141 142 138  K 3.7 3.2* 3.3* 3.8 4.6  CL 106 99* 97* 101 99*  CO2 30 35* 38* 37* 35*    GLUCOSE 100* 115* 114* 107* 202*  BUN 30* 25* 22* 19 24*  CREATININE 0.60* 0.69 0.54* 0.55* 0.66  CALCIUM 7.9* 7.9* 8.0* 7.9* 8.0*   GFR: Estimated Creatinine Clearance: 71.4 mL/min (by C-G formula based on SCr of 0.66 mg/dL). Liver Function Tests:  Recent Labs Lab 02/28/16 1058  AST 22  ALT 14*  ALKPHOS 51  BILITOT 1.3*  PROT 6.9  ALBUMIN 3.3*   No results for input(s): LIPASE, AMYLASE in the last 168 hours. No results for input(s): AMMONIA in the last 168 hours. Coagulation Profile: No results for input(s): INR, PROTIME in the last 168 hours. Cardiac Enzymes: No results for input(s): CKTOTAL, CKMB, CKMBINDEX, TROPONINI in the last 168 hours. BNP (last 3 results) No results for input(s): PROBNP in the  last 8760 hours. HbA1C: No results for input(s): HGBA1C in the last 72 hours. CBG: No results for input(s): GLUCAP in the last 168 hours. Lipid Profile: No results for input(s): CHOL, HDL, LDLCALC, TRIG, CHOLHDL, LDLDIRECT in the last 72 hours. Thyroid Function Tests:  Recent Labs  03/03/16 0304  TSH 0.909   Anemia Panel:  Recent Labs  03/03/16 0304  VITAMINB12 632  FOLATE 9.0  FERRITIN 447*  TIBC 115*  IRON 41*   Sepsis Labs:  Recent Labs Lab 02/28/16 1250  LATICACIDVEN 1.50    Recent Results (from the past 240 hour(s))  Blood culture (routine x 2)     Status: None   Collection Time: 02/28/16 12:29 PM  Result Value Ref Range Status   Specimen Description BLOOD BLOOD LEFT FOREARM  Final   Special Requests BOTTLES DRAWN AEROBIC AND ANAEROBIC 5CC  Final   Culture   Final    NO GROWTH 5 DAYS Performed at [facility]    Report Status 03/04/2016 FINAL  Final  Blood culture (routine x 2)     Status: None   Collection Time: 02/28/16 12:39 PM  Result Value Ref Range Status   Specimen Description BLOOD BLOOD RIGHT FOREARM  Final   Special Requests BOTTLES DRAWN AEROBIC AND ANAEROBIC 5CC  Final   Culture   Final    NO GROWTH 5 DAYS Performed  at [facility]    Report Status 03/04/2016 FINAL  Final  Urine culture     Status: Abnormal   Collection Time: 02/28/16 12:55 PM  Result Value Ref Range Status   Specimen Description URINE, CATHETERIZED  Final   Special Requests NONE  Final   Culture (A)  Final    30,000 COLONIES/mL STAPHYLOCOCCUS SPECIES (COAGULASE NEGATIVE)   Report Status 03/02/2016 FINAL  Final   Organism ID, Bacteria STAPHYLOCOCCUS SPECIES (COAGULASE NEGATIVE) (A)  Final      Susceptibility   Staphylococcus species (coagulase negative) - MIC*    CIPROFLOXACIN >=8 RESISTANT Resistant     GENTAMICIN 8 INTERMEDIATE Intermediate     NITROFURANTOIN <=16 SENSITIVE Sensitive     OXACILLIN >=4 RESISTANT Resistant     TETRACYCLINE >=16 RESISTANT Resistant     VANCOMYCIN 1 SENSITIVE Sensitive     TRIMETH/SULFA 80 RESISTANT Resistant     CLINDAMYCIN >=8 RESISTANT Resistant     RIFAMPIN <=0.5 SENSITIVE Sensitive     Inducible Clindamycin NEGATIVE Sensitive     * 30,000 COLONIES/mL STAPHYLOCOCCUS SPECIES (COAGULASE NEGATIVE)  MRSA PCR Screening     Status: None   Collection Time: 02/28/16 10:44 PM  Result Value Ref Range Status   MRSA by PCR NEGATIVE NEGATIVE Final    Comment:        The GeneXpert MRSA Assay (FDA approved for NASAL specimens only), is one component of a comprehensive MRSA colonization surveillance program. It is not intended to diagnose MRSA infection nor to guide or monitor treatment for MRSA infections.   Culture, sputum-assessment     Status: None   Collection Time: 03/02/16  8:00 PM  Result Value Ref Range Status   Specimen Description EXPECTORATED SPUTUM  Final   Special Requests NONE  Final   Sputum evaluation   Final    THIS SPECIMEN IS ACCEPTABLE. RESPIRATORY CULTURE REPORT TO FOLLOW.   Report Status 03/02/2016 FINAL  Final  Culture, respiratory (NON-Expectorated)     Status: None   Collection Time: 03/02/16  8:00 PM  Result Value Ref Range Status   Specimen Description  SPUTUM  Final   Special Requests NONE  Final   Gram Stain   Final    ABUNDANT WBC PRESENT,BOTH PMN AND MONONUCLEAR FEW YEAST WITH PSEUDOHYPHAE RARE GRAM POSITIVE RODS    Culture   Final    Consistent with normal respiratory flora. Performed at [facility]    Report Status 03/05/2016 FINAL  Final  Urine culture     Status: None   Collection Time: 03/03/16  2:30 AM  Result Value Ref Range Status   Specimen Description URINE, CATHETERIZED  Final   Special Requests NONE  Final   Culture NO GROWTH Performed at [facility]   Final   Report Status 03/04/2016 FINAL  Final     Radiology Studies: Dg Chest Port 1 View  Result Date: 03/05/2016 CLINICAL DATA:  History of pneumonia EXAM: PORTABLE CHEST 1 VIEW COMPARISON:  03/01/16 FINDINGS: Cardiomediastinal silhouette is stable. Persistent infiltrates right upper lobe and bilateral lower lobe. Small bilateral pleural effusion. Multifocal pneumonia is suspected. IMPRESSION: Persistent infiltrates right upper lobe and bilateral lower lobe. Small bilateral pleural effusion. Multifocal pneumonia is suspected. Follow-up to resolution is recommended. Electronically Signed   By: Lahoma Crocker M.D.   On: 03/05/2016 08:56    Scheduled Meds: . heparin  5,000 Units Subcutaneous Q8H  . ipratropium-albuterol  3 mL Nebulization TID  . levofloxacin (LEVAQUIN) IV  750 mg Intravenous Q24H  . mouth rinse  15 mL Mouth Rinse BID  . methylPREDNISolone (SOLU-MEDROL) injection  60 mg Intravenous Q6H  . pantoprazole  40 mg Oral BID AC  . polyethylene glycol  17 g Oral Daily  . sodium chloride  1,000 mL Intravenous Once   Continuous Infusions:   LOS: 6 days   Merrillyn Ackerley, Orpah Melter, MD [facility] Pager [phone]  If 7PM-7AM, please contact night-coverage [email or web address] Password [email or web address] 03/05/2016, 3:25 PM

## 2016-03-06 LAB — CBC
HEMATOCRIT: 37.3 % — AB (ref 39.0–52.0)
Hemoglobin: 11.3 g/dL — ABNORMAL LOW (ref 13.0–17.0)
MCH: 28.2 pg (ref 26.0–34.0)
MCHC: 30.3 g/dL (ref 30.0–36.0)
MCV: 93 fL (ref 78.0–100.0)
Platelets: 204 10*3/uL (ref 150–400)
RBC: 4.01 MIL/uL — ABNORMAL LOW (ref 4.22–5.81)
RDW: 14.8 % (ref 11.5–15.5)
WBC: 6.9 10*3/uL (ref 4.0–10.5)

## 2016-03-06 LAB — BASIC METABOLIC PANEL
Anion gap: 8 (ref 5–15)
BUN: 29 mg/dL — AB (ref 6–20)
CHLORIDE: 98 mmol/L — AB (ref 101–111)
CO2: 34 mmol/L — ABNORMAL HIGH (ref 22–32)
Calcium: 8.3 mg/dL — ABNORMAL LOW (ref 8.9–10.3)
Creatinine, Ser: 0.52 mg/dL — ABNORMAL LOW (ref 0.61–1.24)
GFR calc Af Amer: >60 mL/min (ref >60)
GLUCOSE: 199 mg/dL — AB (ref 65–99)
POTASSIUM: 4.3 mmol/L (ref 3.5–5.1)
Sodium: 140 mmol/L (ref 135–145)

## 2016-03-06 MED ORDER — LEVALBUTEROL HCL 0.63 MG/3ML IN NEBU
0.6300 mg | INHALATION_SOLUTION | Freq: Three times a day (TID) | RESPIRATORY_TRACT | Status: DC
  Administered 2016-03-06 – 2016-03-09 (×11): 0.63 mg via RESPIRATORY_TRACT
  Filled 2016-03-06 (×11): qty 3

## 2016-03-06 MED ORDER — FUROSEMIDE 10 MG/ML IJ SOLN
40.0000 mg | Freq: Once | INTRAMUSCULAR | Status: AC
  Administered 2016-03-06: 40 mg via INTRAVENOUS
  Filled 2016-03-06: qty 4

## 2016-03-06 MED ORDER — TIOTROPIUM BROMIDE MONOHYDRATE 18 MCG IN CAPS
18.0000 ug | ORAL_CAPSULE | Freq: Every day | RESPIRATORY_TRACT | Status: DC
  Administered 2016-03-06 – 2016-03-09 (×4): 18 ug via RESPIRATORY_TRACT
  Filled 2016-03-06: qty 5

## 2016-03-06 NOTE — Progress Notes (Signed)
PROGRESS NOTE    Allen Beasley  [id number] DOB: Jan 11, 1924 DOA: 02/28/2016 PCP: Geoffery Lyons, MD    Brief Narrative:  82 year old male with COPD, aortic insufficiency, hypertension, hyperlipidemia, was recently admitted from 10/26-11/7/17 for sepsis and necrotizing pneumonia, was discharged with a 4-week course of Levaquin.  He returned to the ER with nausea, vomiting, abdominal distention, pain, and inability to have a BM.  CT scan ab/p demonstrated an incarcerated direct right inguinal hernia.  He underwent urgent primary repair by Dr. Barry Dienes on 11/18.  He was persistently hypotensive post-operatively with decreased uop and required multiple boluses and IVF.  On 11/20, he developed worsening respiratory distress and despite multiple doses of lasix, required nonrebreather.  CXR demonstrated worsening RLL pneumonia and patient was already on broad spectrum antibiotics.  Suspect aspiration pneumonia.  PCCM was consulted who recommended bipap with close monitoring in case of further respiratory compromise.  Despite his recent abdominal surgery, he tolerated bipap well and was changed to HFNC on 11/21.  Assessment & Plan:   Principal Problem:   Incarcerated right inguinal hernia s/p primary repair 02/28/2016 Active Problems:   COPD with emphysema (HCC)   HTN (hypertension)   Acute on chronic respiratory failure with hypoxia (HCC)   Esophageal reflux   SBO (small bowel obstruction)   Multifocal pneumonia   HCAP (healthcare-associated pneumonia)  SBO - secondary to incarcerated R inguinal hernia and now s/p surgical repair by Dr. Barry Dienes on 02/28/16 - Patient is tolerating diet without difficulty -Surgery continues to follow. Recs to follow up with Dr. Barry Dienes in 2-4 weeks post discharge - Remains stable  Acute on chronic respiratory failure due to COPD with emphysema ([place]) and recent pneumonia with probable superimposed aspiration pneumonia - Earlier CXR with findings suggestive  of RLL - Continue NSAIDs, incentive spirometry, flutter valve as tolerated - PCCM had recommended for total 10 days of antibiotics, as needed lasix as kidneys/blood pressure tolerates -Patient continues on high flow O2. Remains at 25L this AM. Slow to wean -Follow up study with multifocal pneumonia - Will give trial of lasix x 1 dose per Pulmonary recommendations to assist with weaning  HTN (hypertension),  -Low pressures noted this admission -Blood pressures have improved, vital signs reviewed -for now, BP meds remain on hold  Esophageal reflux,  - Remains stable at present -denies epigastric pain at present  Hypokalemia due to diuresis - Labs reviewed. - Potassium within normal limits today  Normocytic anemia - Hemoglobin remained stable.  - hgb today 11.3 - TSH within normal limits at 0.90  DVT prophylaxis: Heparin subQ Code Status: Full Family Communication: Pt in room, family not at bedside Disposition Plan: SNF when medically stable and weaned from high flow O2   Consultants:   Pulmonary  General Surgery  Procedures:   Surgical repair of incarcerated hernia  Antimicrobials: Anti-infectives    Start     Dose/Rate Route Frequency Ordered Stop   03/01/16 1400  levofloxacin (LEVAQUIN) IVPB 750 mg     750 mg 100 mL/hr over 90 Minutes Intravenous Every 24 hours 03/01/16 1312     02/29/16 0600  ciprofloxacin (CIPRO) IVPB 400 mg  Status:  Discontinued     400 mg 200 mL/hr over 60 Minutes Intravenous On call to O.R. 02/28/16 1731 02/29/16 0603   02/29/16 0200  vancomycin (VANCOCIN) IVPB 1000 mg/200 mL premix  Status:  Discontinued     1,000 mg 200 mL/hr over 60 Minutes Intravenous Every 12 hours 02/28/16 1340 03/01/16 1156  02/28/16 2200  aztreonam (AZACTAM) 2 g in dextrose 5 % 50 mL IVPB  Status:  Discontinued     2 g 100 mL/hr over 30 Minutes Intravenous Every 8 hours 02/28/16 1340 02/28/16 1430   02/28/16 2200  aztreonam (AZACTAM) 2 g in dextrose 5 % 50 mL  IVPB  Status:  Discontinued     2 g 100 mL/hr over 30 Minutes Intravenous Every 8 hours 02/28/16 2140 03/01/16 1156   02/28/16 1330  aztreonam (AZACTAM) 2 g in dextrose 5 % 50 mL IVPB     2 g 100 mL/hr over 30 Minutes Intravenous  Once 02/28/16 1317 02/28/16 1417   02/28/16 1330  vancomycin (VANCOCIN) IVPB 1000 mg/200 mL premix     1,000 mg 200 mL/hr over 60 Minutes Intravenous  Once 02/28/16 1317 02/28/16 1737      Subjective: No complaints at present  Objective: Vitals:   03/06/16 0755 03/06/16 0800 03/06/16 1200 03/06/16 1504  BP:      Pulse:      Resp:      Temp:  98.5 F (36.9 C) 98.3 F (36.8 C)   TempSrc:  Oral Oral   SpO2: 90%   92%  Weight:      Height:        Intake/Output Summary (Last 24 hours) at 03/06/16 1530 Last data filed at 03/06/16 0630  Gross per 24 hour  Intake              150 ml  Output              810 ml  Net             -660 ml   Filed Weights   02/28/16 1241 02/29/16 0000  Weight: 83.9 kg (185 lb) 85.2 kg (187 lb 13.3 oz)    Examination:  General exam: Laying in bed asleep,easily arousable, in nad Respiratory system: normal resp effort, no audible wheezing Cardiovascular system: Regular rhythm, s1, s2 Gastrointestinal system: nondistended, no masses Central nervous system: no seizures, no tremors Extremities: no joint deformities, without cyanosis Skin: no pallor, no rashes Psychiatry: affect normal, no auditory hallucinations  Data Reviewed: I have personally reviewed following labs and imaging studies  CBC:  Recent Labs Lab 03/02/16 0307 03/03/16 0304 03/04/16 0304 03/05/16 0306 03/06/16 0342  WBC 8.2 5.3 5.9 5.4 6.9  HGB 10.5* 10.5* 10.4* 10.4* 11.3*  HCT 34.2* 33.9* 33.6* 33.7* 37.3*  MCV 91.4 92.6 92.8 90.3 93.0  PLT 147* 165 176 192 0000000   Basic Metabolic Panel:  Recent Labs Lab 03/02/16 0307 03/03/16 0304 03/04/16 0304 03/05/16 0306 03/06/16 0342  NA 140 141 142 138 140  K 3.2* 3.3* 3.8 4.6 4.3  CL 99*  97* 101 99* 98*  CO2 35* 38* 37* 35* 34*  GLUCOSE 115* 114* 107* 202* 199*  BUN 25* 22* 19 24* 29*  CREATININE 0.69 0.54* 0.55* 0.66 0.52*  CALCIUM 7.9* 8.0* 7.9* 8.0* 8.3*   GFR: Estimated Creatinine Clearance: 71.4 mL/min (by C-G formula based on SCr of 0.52 mg/dL (L)). Liver Function Tests: No results for input(s): AST, ALT, ALKPHOS, BILITOT, PROT, ALBUMIN in the last 168 hours. No results for input(s): LIPASE, AMYLASE in the last 168 hours. No results for input(s): AMMONIA in the last 168 hours. Coagulation Profile: No results for input(s): INR, PROTIME in the last 168 hours. Cardiac Enzymes: No results for input(s): CKTOTAL, CKMB, CKMBINDEX, TROPONINI in the last 168 hours. BNP (last 3 results) No results for  input(s): PROBNP in the last 8760 hours. HbA1C: No results for input(s): HGBA1C in the last 72 hours. CBG: No results for input(s): GLUCAP in the last 168 hours. Lipid Profile: No results for input(s): CHOL, HDL, LDLCALC, TRIG, CHOLHDL, LDLDIRECT in the last 72 hours. Thyroid Function Tests: No results for input(s): TSH, T4TOTAL, FREET4, T3FREE, THYROIDAB in the last 72 hours. Anemia Panel: No results for input(s): VITAMINB12, FOLATE, FERRITIN, TIBC, IRON, RETICCTPCT in the last 72 hours. Sepsis Labs: No results for input(s): PROCALCITON, LATICACIDVEN in the last 168 hours.  Recent Results (from the past 240 hour(s))  Blood culture (routine x 2)     Status: None   Collection Time: 02/28/16 12:29 PM  Result Value Ref Range Status   Specimen Description BLOOD BLOOD LEFT FOREARM  Final   Special Requests BOTTLES DRAWN AEROBIC AND ANAEROBIC 5CC  Final   Culture   Final    NO GROWTH 5 DAYS Performed at [facility]    Report Status 03/04/2016 FINAL  Final  Blood culture (routine x 2)     Status: None   Collection Time: 02/28/16 12:39 PM  Result Value Ref Range Status   Specimen Description BLOOD BLOOD RIGHT FOREARM  Final   Special Requests BOTTLES DRAWN  AEROBIC AND ANAEROBIC 5CC  Final   Culture   Final    NO GROWTH 5 DAYS Performed at [facility]    Report Status 03/04/2016 FINAL  Final  Urine culture     Status: Abnormal   Collection Time: 02/28/16 12:55 PM  Result Value Ref Range Status   Specimen Description URINE, CATHETERIZED  Final   Special Requests NONE  Final   Culture (A)  Final    30,000 COLONIES/mL STAPHYLOCOCCUS SPECIES (COAGULASE NEGATIVE)   Report Status 03/02/2016 FINAL  Final   Organism ID, Bacteria STAPHYLOCOCCUS SPECIES (COAGULASE NEGATIVE) (A)  Final      Susceptibility   Staphylococcus species (coagulase negative) - MIC*    CIPROFLOXACIN >=8 RESISTANT Resistant     GENTAMICIN 8 INTERMEDIATE Intermediate     NITROFURANTOIN <=16 SENSITIVE Sensitive     OXACILLIN >=4 RESISTANT Resistant     TETRACYCLINE >=16 RESISTANT Resistant     VANCOMYCIN 1 SENSITIVE Sensitive     TRIMETH/SULFA 80 RESISTANT Resistant     CLINDAMYCIN >=8 RESISTANT Resistant     RIFAMPIN <=0.5 SENSITIVE Sensitive     Inducible Clindamycin NEGATIVE Sensitive     * 30,000 COLONIES/mL STAPHYLOCOCCUS SPECIES (COAGULASE NEGATIVE)  MRSA PCR Screening     Status: None   Collection Time: 02/28/16 10:44 PM  Result Value Ref Range Status   MRSA by PCR NEGATIVE NEGATIVE Final    Comment:        The GeneXpert MRSA Assay (FDA approved for NASAL specimens only), is one component of a comprehensive MRSA colonization surveillance program. It is not intended to diagnose MRSA infection nor to guide or monitor treatment for MRSA infections.   Culture, sputum-assessment     Status: None   Collection Time: 03/02/16  8:00 PM  Result Value Ref Range Status   Specimen Description EXPECTORATED SPUTUM  Final   Special Requests NONE  Final   Sputum evaluation   Final    THIS SPECIMEN IS ACCEPTABLE. RESPIRATORY CULTURE REPORT TO FOLLOW.   Report Status 03/02/2016 FINAL  Final  Culture, respiratory (NON-Expectorated)     Status: None   Collection  Time: 03/02/16  8:00 PM  Result Value Ref Range Status   Specimen Description  SPUTUM  Final   Special Requests NONE  Final   Gram Stain   Final    ABUNDANT WBC PRESENT,BOTH PMN AND MONONUCLEAR FEW YEAST WITH PSEUDOHYPHAE RARE GRAM POSITIVE RODS    Culture   Final    Consistent with normal respiratory flora. Performed at [facility]    Report Status 03/05/2016 FINAL  Final  Urine culture     Status: None   Collection Time: 03/03/16  2:30 AM  Result Value Ref Range Status   Specimen Description URINE, CATHETERIZED  Final   Special Requests NONE  Final   Culture NO GROWTH Performed at [facility]   Final   Report Status 03/04/2016 FINAL  Final     Radiology Studies: Dg Chest Port 1 View  Result Date: 03/05/2016 CLINICAL DATA:  History of pneumonia EXAM: PORTABLE CHEST 1 VIEW COMPARISON:  03/01/16 FINDINGS: Cardiomediastinal silhouette is stable. Persistent infiltrates right upper lobe and bilateral lower lobe. Small bilateral pleural effusion. Multifocal pneumonia is suspected. IMPRESSION: Persistent infiltrates right upper lobe and bilateral lower lobe. Small bilateral pleural effusion. Multifocal pneumonia is suspected. Follow-up to resolution is recommended. Electronically Signed   By: Lahoma Crocker M.D.   On: 03/05/2016 08:56    Scheduled Meds: . heparin  5,000 Units Subcutaneous Q8H  . levalbuterol  0.63 mg Nebulization TID  . levofloxacin (LEVAQUIN) IV  750 mg Intravenous Q24H  . mouth rinse  15 mL Mouth Rinse BID  . methylPREDNISolone (SOLU-MEDROL) injection  60 mg Intravenous Q6H  . pantoprazole  40 mg Oral BID AC  . polyethylene glycol  17 g Oral Daily  . sodium chloride  1,000 mL Intravenous Once  . tiotropium  18 mcg Inhalation Daily   Continuous Infusions:   LOS: 7 days   Allen Beasley, Allen Melter, MD [facility] Pager [phone]  If 7PM-7AM, please contact night-coverage [email or web address] Password [email or web address] 03/06/2016, 3:30 PM

## 2016-03-06 NOTE — Patient Care Conference (Signed)
Attempted to call patient's son, Legrand Como. No answer. Will try again later

## 2016-03-07 LAB — CBC
HEMATOCRIT: 36 % — AB (ref 39.0–52.0)
Hemoglobin: 11.3 g/dL — ABNORMAL LOW (ref 13.0–17.0)
MCH: 28.6 pg (ref 26.0–34.0)
MCHC: 31.4 g/dL (ref 30.0–36.0)
MCV: 91.1 fL (ref 78.0–100.0)
Platelets: 230 10*3/uL (ref 150–400)
RBC: 3.95 MIL/uL — ABNORMAL LOW (ref 4.22–5.81)
RDW: 14.7 % (ref 11.5–15.5)
WBC: 6.7 10*3/uL (ref 4.0–10.5)

## 2016-03-07 MED ORDER — FUROSEMIDE 10 MG/ML IJ SOLN
40.0000 mg | Freq: Once | INTRAMUSCULAR | Status: AC
  Administered 2016-03-07: 40 mg via INTRAVENOUS
  Filled 2016-03-07: qty 4

## 2016-03-07 MED ORDER — ONDANSETRON HCL 4 MG/2ML IJ SOLN
4.0000 mg | Freq: Four times a day (QID) | INTRAMUSCULAR | Status: DC | PRN
  Administered 2016-03-07: 4 mg via INTRAVENOUS
  Filled 2016-03-07: qty 2

## 2016-03-07 MED ORDER — LACTULOSE 10 GM/15ML PO SOLN
30.0000 g | ORAL | Status: AC
  Administered 2016-03-07: 30 g via ORAL
  Filled 2016-03-07: qty 45

## 2016-03-07 NOTE — Progress Notes (Signed)
PROGRESS NOTE    Allen Beasley  [id number] DOB: [birth year] DOA: 02/28/2016 PCP: Geoffery Lyons, MD    Brief Narrative:  82 year old male with COPD, aortic insufficiency, hypertension, hyperlipidemia, was recently admitted from 10/26-11/7/17 for sepsis and necrotizing pneumonia, was discharged with a 4-week course of Levaquin.  He returned to the ER with nausea, vomiting, abdominal distention, pain, and inability to have a BM.  CT scan ab/p demonstrated an incarcerated direct right inguinal hernia.  He underwent urgent primary repair by Dr. Barry Dienes on 11/18.  He was persistently hypotensive post-operatively with decreased uop and required multiple boluses and IVF.  On 11/20, he developed worsening respiratory distress and despite multiple doses of lasix, required nonrebreather.  CXR demonstrated worsening RLL pneumonia and patient was already on broad spectrum antibiotics.  Suspect aspiration pneumonia.  PCCM was consulted who recommended bipap with close monitoring in case of further respiratory compromise.  Despite his recent abdominal surgery, he tolerated bipap well and was changed to HFNC on 11/21.  Assessment & Plan:   Principal Problem:   Incarcerated right inguinal hernia s/p primary repair 02/28/2016 Active Problems:   COPD with emphysema (HCC)   HTN (hypertension)   Acute on chronic respiratory failure with hypoxia (HCC)   Esophageal reflux   SBO (small bowel obstruction)   Multifocal pneumonia   HCAP (healthcare-associated pneumonia)  SBO - secondary to incarcerated R inguinal hernia and now s/p surgical repair by Dr. Barry Dienes on 02/28/16 - Patient is tolerating diet without difficulty -Surgery continues to follow. Recs to follow up with Dr. Barry Dienes in 2-4 weeks post discharge - Positive bowel sounds on exam. Patient tolerating diet -Constipated today (see below)  Acute on chronic respiratory failure due to COPD with emphysema ([place]) and recent pneumonia with probable  superimposed aspiration pneumonia - Earlier CXR with findings suggestive of RLL - Continue NSAIDs, incentive spirometry, flutter valve as tolerated - PCCM had recommended for total 10 days of antibiotics, as needed lasix as kidneys/blood pressure tolerates -Follow up study with multifocal pneumonia - Patient with 2 L urine output during dayshift after 1 dose of Lasix. -We'll give another dose of IV Lasix today. Patient remains on high flow O2, remains slow/difficult to wean  HTN (hypertension),  -Low pressures noted this admission -Blood pressures have improved, vital signs reviewed -Continue to hold BP medications today  Esophageal reflux,  - Remains stable at present -Tolerating diet,  Hypokalemia due to diuresis - Resolved  Normocytic anemia - Hemoglobin remained stable.  - hgb remains 11.3  Constipation -New finding -Positive bowel sounds on exam -Patient does complain of some nausea -Given trial of lactulose with very good results this afternoon  DVT prophylaxis: Heparin subQ Code Status: Full Family Communication: Pt in room, family not at bedside Disposition Plan: SNF when medically stable and weaned from high flow O2   Consultants:   Pulmonary  General Surgery  Procedures:   Surgical repair of incarcerated hernia  Antimicrobials: Anti-infectives    Start     Dose/Rate Route Frequency Ordered Stop   03/01/16 1400  levofloxacin (LEVAQUIN) IVPB 750 mg     750 mg 100 mL/hr over 90 Minutes Intravenous Every 24 hours 03/01/16 1312     02/29/16 0600  ciprofloxacin (CIPRO) IVPB 400 mg  Status:  Discontinued     400 mg 200 mL/hr over 60 Minutes Intravenous On call to O.R. 02/28/16 1731 02/29/16 0603   02/29/16 0200  vancomycin (VANCOCIN) IVPB 1000 mg/200 mL premix  Status:  Discontinued  1,000 mg 200 mL/hr over 60 Minutes Intravenous Every 12 hours 02/28/16 1340 03/01/16 1156   02/28/16 2200  aztreonam (AZACTAM) 2 g in dextrose 5 % 50 mL IVPB  Status:   Discontinued     2 g 100 mL/hr over 30 Minutes Intravenous Every 8 hours 02/28/16 1340 02/28/16 1430   02/28/16 2200  aztreonam (AZACTAM) 2 g in dextrose 5 % 50 mL IVPB  Status:  Discontinued     2 g 100 mL/hr over 30 Minutes Intravenous Every 8 hours 02/28/16 2140 03/01/16 1156   02/28/16 1330  aztreonam (AZACTAM) 2 g in dextrose 5 % 50 mL IVPB     2 g 100 mL/hr over 30 Minutes Intravenous  Once 02/28/16 1317 02/28/16 1417   02/28/16 1330  vancomycin (VANCOCIN) IVPB 1000 mg/200 mL premix     1,000 mg 200 mL/hr over 60 Minutes Intravenous  Once 02/28/16 1317 02/28/16 1737      Subjective: Complains of nausea, constipation  Objective: Vitals:   03/07/16 1000 03/07/16 1100 03/07/16 1200 03/07/16 1500  BP:      Pulse: 80 83    Resp: (!) 23 14    Temp:   98 F (36.7 C)   TempSrc:   Oral   SpO2: (!) 84% 95%  (!) 89%  Weight:      Height:        Intake/Output Summary (Last 24 hours) at 03/07/16 1509 Last data filed at 03/07/16 0600  Gross per 24 hour  Intake              270 ml  Output             2525 ml  Net            -2255 ml   Filed Weights   02/28/16 1241 02/29/16 0000  Weight: 83.9 kg (185 lb) 85.2 kg (187 lb 13.3 oz)    Examination:  General exam: Awake, lying in bed, no acute distress Respiratory system: Normal chest rise, no crackles Cardiovascular system: Regular rate, S1-S2 Gastrointestinal system: Positive bowel sounds, nondistended, nontender, postoperative site clean without erythema Central nervous system: CN II through XII grossly intact, sensation intact Extremities: Perfused, no clubbing Skin: Normal skin turgor, no notable skin lesions seen Psychiatry: Mood normal, no visual hallucinations  Data Reviewed: I have personally reviewed following labs and imaging studies  CBC:  Recent Labs Lab 03/03/16 0304 03/04/16 0304 03/05/16 0306 03/06/16 0342 03/07/16 0850  WBC 5.3 5.9 5.4 6.9 6.7  HGB 10.5* 10.4* 10.4* 11.3* 11.3*  HCT 33.9* 33.6*  33.7* 37.3* 36.0*  MCV 92.6 92.8 90.3 93.0 91.1  PLT 165 176 192 204 [id number]   Basic Metabolic Panel:  Recent Labs Lab 03/02/16 0307 03/03/16 0304 03/04/16 0304 03/05/16 0306 03/06/16 0342  NA 140 141 142 138 140  K 3.2* 3.3* 3.8 4.6 4.3  CL 99* 97* 101 99* 98*  CO2 35* 38* 37* 35* 34*  GLUCOSE 115* 114* 107* 202* 199*  BUN 25* 22* 19 24* 29*  CREATININE 0.69 0.54* 0.55* 0.66 0.52*  CALCIUM 7.9* 8.0* 7.9* 8.0* 8.3*   GFR: Estimated Creatinine Clearance: 71.4 mL/min (by C-G formula based on SCr of 0.52 mg/dL (L)). Liver Function Tests: No results for input(s): AST, ALT, ALKPHOS, BILITOT, PROT, ALBUMIN in the last 168 hours. No results for input(s): LIPASE, AMYLASE in the last 168 hours. No results for input(s): AMMONIA in the last 168 hours. Coagulation Profile: No results for input(s): INR, PROTIME in  the last 168 hours. Cardiac Enzymes: No results for input(s): CKTOTAL, CKMB, CKMBINDEX, TROPONINI in the last 168 hours. BNP (last 3 results) No results for input(s): PROBNP in the last 8760 hours. HbA1C: No results for input(s): HGBA1C in the last 72 hours. CBG: No results for input(s): GLUCAP in the last 168 hours. Lipid Profile: No results for input(s): CHOL, HDL, LDLCALC, TRIG, CHOLHDL, LDLDIRECT in the last 72 hours. Thyroid Function Tests: No results for input(s): TSH, T4TOTAL, FREET4, T3FREE, THYROIDAB in the last 72 hours. Anemia Panel: No results for input(s): VITAMINB12, FOLATE, FERRITIN, TIBC, IRON, RETICCTPCT in the last 72 hours. Sepsis Labs: No results for input(s): PROCALCITON, LATICACIDVEN in the last 168 hours.  Recent Results (from the past 240 hour(s))  Blood culture (routine x 2)     Status: None   Collection Time: 02/28/16 12:29 PM  Result Value Ref Range Status   Specimen Description BLOOD BLOOD LEFT FOREARM  Final   Special Requests BOTTLES DRAWN AEROBIC AND ANAEROBIC 5CC  Final   Culture   Final    NO GROWTH 5 DAYS Performed at [facility]    Report Status 03/04/2016 FINAL  Final  Blood culture (routine x 2)     Status: None   Collection Time: 02/28/16 12:39 PM  Result Value Ref Range Status   Specimen Description BLOOD BLOOD RIGHT FOREARM  Final   Special Requests BOTTLES DRAWN AEROBIC AND ANAEROBIC 5CC  Final   Culture   Final    NO GROWTH 5 DAYS Performed at [facility]    Report Status 03/04/2016 FINAL  Final  Urine culture     Status: Abnormal   Collection Time: 02/28/16 12:55 PM  Result Value Ref Range Status   Specimen Description URINE, CATHETERIZED  Final   Special Requests NONE  Final   Culture (A)  Final    30,000 COLONIES/mL STAPHYLOCOCCUS SPECIES (COAGULASE NEGATIVE)   Report Status 03/02/2016 FINAL  Final   Organism ID, Bacteria STAPHYLOCOCCUS SPECIES (COAGULASE NEGATIVE) (A)  Final      Susceptibility   Staphylococcus species (coagulase negative) - MIC*    CIPROFLOXACIN >=8 RESISTANT Resistant     GENTAMICIN 8 INTERMEDIATE Intermediate     NITROFURANTOIN <=16 SENSITIVE Sensitive     OXACILLIN >=4 RESISTANT Resistant     TETRACYCLINE >=16 RESISTANT Resistant     VANCOMYCIN 1 SENSITIVE Sensitive     TRIMETH/SULFA 80 RESISTANT Resistant     CLINDAMYCIN >=8 RESISTANT Resistant     RIFAMPIN <=0.5 SENSITIVE Sensitive     Inducible Clindamycin NEGATIVE Sensitive     * 30,000 COLONIES/mL STAPHYLOCOCCUS SPECIES (COAGULASE NEGATIVE)  MRSA PCR Screening     Status: None   Collection Time: 02/28/16 10:44 PM  Result Value Ref Range Status   MRSA by PCR NEGATIVE NEGATIVE Final    Comment:        The GeneXpert MRSA Assay (FDA approved for NASAL specimens only), is one component of a comprehensive MRSA colonization surveillance program. It is not intended to diagnose MRSA infection nor to guide or monitor treatment for MRSA infections.   Culture, sputum-assessment     Status: None   Collection Time: 03/02/16  8:00 PM  Result Value Ref Range Status   Specimen Description EXPECTORATED  SPUTUM  Final   Special Requests NONE  Final   Sputum evaluation   Final    THIS SPECIMEN IS ACCEPTABLE. RESPIRATORY CULTURE REPORT TO FOLLOW.   Report Status 03/02/2016 FINAL  Final  Culture,  respiratory (NON-Expectorated)     Status: None   Collection Time: 03/02/16  8:00 PM  Result Value Ref Range Status   Specimen Description SPUTUM  Final   Special Requests NONE  Final   Gram Stain   Final    ABUNDANT WBC PRESENT,BOTH PMN AND MONONUCLEAR FEW YEAST WITH PSEUDOHYPHAE RARE GRAM POSITIVE RODS    Culture   Final    Consistent with normal respiratory flora. Performed at [facility]    Report Status 03/05/2016 FINAL  Final  Urine culture     Status: None   Collection Time: 03/03/16  2:30 AM  Result Value Ref Range Status   Specimen Description URINE, CATHETERIZED  Final   Special Requests NONE  Final   Culture NO GROWTH Performed at [facility]   Final   Report Status 03/04/2016 FINAL  Final     Radiology Studies: No results found.  Scheduled Meds: . heparin  5,000 Units Subcutaneous Q8H  . levalbuterol  0.63 mg Nebulization TID  . levofloxacin (LEVAQUIN) IV  750 mg Intravenous Q24H  . mouth rinse  15 mL Mouth Rinse BID  . methylPREDNISolone (SOLU-MEDROL) injection  60 mg Intravenous Q6H  . pantoprazole  40 mg Oral BID AC  . polyethylene glycol  17 g Oral Daily  . sodium chloride  1,000 mL Intravenous Once  . tiotropium  18 mcg Inhalation Daily   Continuous Infusions:   LOS: 8 days   Trystan Akhtar, Orpah Melter, MD [facility] Pager [phone]  If 7PM-7AM, please contact night-coverage [email or web address] Password [email or web address] 03/07/2016, 3:09 PM

## 2016-03-07 NOTE — Treatment Plan (Signed)
Attempted to call son at number listed, again no answer. Called and updated patient's niece. All questions answered and family is in agreement with plan as outlined.  Of note, cell number for patient's son, Legrand Como, is: [phone]

## 2016-03-08 LAB — BASIC METABOLIC PANEL
ANION GAP: 5 (ref 5–15)
BUN: 38 mg/dL — ABNORMAL HIGH (ref 6–20)
CALCIUM: 8.2 mg/dL — AB (ref 8.9–10.3)
CO2: 37 mmol/L — ABNORMAL HIGH (ref 22–32)
Chloride: 95 mmol/L — ABNORMAL LOW (ref 101–111)
Creatinine, Ser: 0.66 mg/dL (ref 0.61–1.24)
Glucose, Bld: 224 mg/dL — ABNORMAL HIGH (ref 65–99)
POTASSIUM: 4.6 mmol/L (ref 3.5–5.1)
Sodium: 137 mmol/L (ref 135–145)

## 2016-03-08 MED ORDER — LACTULOSE 10 GM/15ML PO SOLN
30.0000 g | Freq: Once | ORAL | Status: AC
  Administered 2016-03-08: 30 g via ORAL
  Filled 2016-03-08: qty 45

## 2016-03-08 MED ORDER — LEVOFLOXACIN 750 MG PO TABS
750.0000 mg | ORAL_TABLET | ORAL | Status: DC
Start: 2016-03-08 — End: 2016-03-09
  Administered 2016-03-08: 750 mg via ORAL
  Filled 2016-03-08: qty 1

## 2016-03-08 NOTE — Progress Notes (Signed)
9 Days Post-Op  Subjective: His biggest complaint is lack of adequate BM's.  Nothing recorded, but the nursing staff reports BM yesterday and today.  PO not recorded, he can't remember what he ate, but no nausea of vomiting with it.    Objective: Vital signs in last 24 hours: Temp:  [97.8 F (36.6 C)-98.9 F (37.2 C)] 97.8 F (36.6 C) (11/27 0400) Pulse Rate:  [49-90] 69 (11/27 0737) Resp:  [11-29] 16 (11/27 0737) BP: (107-153)/(43-128) 148/55 (11/27 0737) SpO2:  [84 %-97 %] 89 % (11/27 0737) FiO2 (%):  [90 %-100 %] 90 % (11/27 0737) Last BM Date: 03/07/16 120 PO recorded Urine 1875 Afebrile, VSS BMP OK glucose 224 Last CXR 11/24:Persistent infiltrates right upper lobe and bilateral lower lobe. Small bilateral pleural effusion. Multifocal pneumonia is suspected   Intake/Output from previous day: 11/26 0701 - 11/27 0700 In: 270 [P.O.:120; IV Piggyback:150] Out: 1875 [Urine:1875] Intake/Output this shift: No intake/output data recorded.  General appearance: alert, cooperative and no distress GI: soft, non-tender; bowel sounds normal; no masses,  no organomegaly and Incision looks fine.    Lab Results:   Recent Labs  03/06/16 0342 03/07/16 0850  WBC 6.9 6.7  HGB 11.3* 11.3*  HCT 37.3* 36.0*  PLT 204 230    BMET  Recent Labs  03/06/16 0342 03/08/16 0322  NA 140 137  K 4.3 4.6  CL 98* 95*  CO2 34* 37*  GLUCOSE 199* 224*  BUN 29* 38*  CREATININE 0.52* 0.66  CALCIUM 8.3* 8.2*   PT/INR No results for input(s): LABPROT, INR in the last 72 hours.  No results for input(s): AST, ALT, ALKPHOS, BILITOT, PROT, ALBUMIN in the last 168 hours.   Lipase     Component Value Date/Time   LIPASE 19 02/04/2016 2146     Studies/Results: No results found.  Medications: . heparin  5,000 Units Subcutaneous Q8H  . levalbuterol  0.63 mg Nebulization TID  . levofloxacin (LEVAQUIN) IV  750 mg Intravenous Q24H  . mouth rinse  15 mL Mouth Rinse BID  . methylPREDNISolone  (SOLU-MEDROL) injection  60 mg Intravenous Q6H  . pantoprazole  40 mg Oral BID AC  . polyethylene glycol  17 g Oral Daily  . sodium chloride  1,000 mL Intravenous Once  . tiotropium  18 mcg Inhalation Daily      Assessment/Plan  Incarcerated right inguinal hernia  Primary repair of incarcerated direct right inguinal hernia with obstruction, 02/28/16, Dr. Stark Klein  POD 9 Post op acute respiratory failure/ multilobar Pneumonia FEN:  Regular diet ID: day 10 antibiotics - day 8 Levofloxacin DVT:  Heparin   Plan:  We will get his staples out day 14, or sooner if he is going home.  Follow up information is in the AVS for our service and office.        LOS: 9 days    Allen Beasley 03/08/2016 [phone]

## 2016-03-08 NOTE — Progress Notes (Signed)
PROGRESS NOTE    Allen Beasley  [id number] DOB: 23-Mar-1924 DOA: 02/28/2016 PCP: Geoffery Lyons, MD    Brief Narrative:  82 year old male with COPD, aortic insufficiency, hypertension, hyperlipidemia, was recently admitted from 10/26-11/7/17 for sepsis and necrotizing pneumonia, was discharged with a 4-week course of Levaquin.  He returned to the ER with nausea, vomiting, abdominal distention, pain, and inability to have a BM.  CT scan ab/p demonstrated an incarcerated direct right inguinal hernia.  He underwent urgent primary repair by Dr. Barry Dienes on 11/18.  He was persistently hypotensive post-operatively with decreased uop and required multiple boluses and IVF.  On 11/20, he developed worsening respiratory distress and despite multiple doses of lasix, required nonrebreather.  CXR demonstrated worsening RLL pneumonia and patient was already on broad spectrum antibiotics.  Suspect aspiration pneumonia.  PCCM was consulted who recommended bipap with close monitoring in case of further respiratory compromise.  Despite his recent abdominal surgery, he tolerated bipap well and was changed to HFNC on 11/21.  Assessment & Plan:   Principal Problem:   Incarcerated right inguinal hernia s/p primary repair 02/28/2016 Active Problems:   COPD with emphysema (HCC)   HTN (hypertension)   Acute on chronic respiratory failure with hypoxia (HCC)   Esophageal reflux   SBO (small bowel obstruction)   Multifocal pneumonia   HCAP (healthcare-associated pneumonia)  SBO - secondary to incarcerated R inguinal hernia and now s/p surgical repair by Dr. Barry Dienes on 02/28/16 - Patient is tolerating diet without difficulty -Surgery continues to follow. Recs to follow up with Dr. Barry Dienes in 2-4 weeks post discharge - Positive bowel sounds on exam. Patient tolerating diet -Discussed case with General Surgery today. Recs to continue to focus on bowel movements - constipation resolved with cathartics   Acute on  chronic respiratory failure due to COPD with emphysema ([place]) and recent pneumonia with probable superimposed aspiration pneumonia - Earlier CXR with findings suggestive of RLL - Continue NSAIDs, incentive spirometry, flutter valve as tolerated - PCCM had recommended for total 10 days of antibiotics, as needed lasix as kidneys/blood pressure tolerates -Follow up study with multifocal pneumonia - Patient given doses of lasix on 11/25 and 11/26, no significant improvement with O2 requirements - Discussed case with Pulmonary, who recommend continuing current plan. Likely will be slow to wean from O2. Consider LTAC for slow O2 weaning? Have placed Case Manager consult  HTN (hypertension),  -Low pressures noted early this admission -Blood pressures have improved, vital signs reviewed -BP remains controlled without BP meds  Esophageal reflux,  - Remains stable at present - Tolerating diet without difficulty  Hypokalemia due to diuresis - Resolved  Normocytic anemia - Hemoglobin remained stable.  - most recent hgb of 11.3  Constipation -Improved with cathartics -Still feels constipated this AM. -Will give trial of lactulose plus enema  DVT prophylaxis: Heparin subQ Code Status: Full Family Communication: Pt in room, family not at bedside Disposition Plan: SNF when medically stable and weaned from high flow O2   Consultants:   Pulmonary  General Surgery  Procedures:   Surgical repair of incarcerated hernia  Antimicrobials: Anti-infectives    Start     Dose/Rate Route Frequency Ordered Stop   03/08/16 2000  levofloxacin (LEVAQUIN) tablet 750 mg     750 mg Oral Every 24 hours 03/08/16 1345     03/01/16 1400  levofloxacin (LEVAQUIN) IVPB 750 mg  Status:  Discontinued     750 mg 100 mL/hr over 90 Minutes Intravenous Every 24 hours 03/01/16  1312 03/08/16 1345   02/29/16 0600  ciprofloxacin (CIPRO) IVPB 400 mg  Status:  Discontinued     400 mg 200 mL/hr over 60 Minutes  Intravenous On call to O.R. 02/28/16 1731 02/29/16 0603   02/29/16 0200  vancomycin (VANCOCIN) IVPB 1000 mg/200 mL premix  Status:  Discontinued     1,000 mg 200 mL/hr over 60 Minutes Intravenous Every 12 hours 02/28/16 1340 03/01/16 1156   02/28/16 2200  aztreonam (AZACTAM) 2 g in dextrose 5 % 50 mL IVPB  Status:  Discontinued     2 g 100 mL/hr over 30 Minutes Intravenous Every 8 hours 02/28/16 1340 02/28/16 1430   02/28/16 2200  aztreonam (AZACTAM) 2 g in dextrose 5 % 50 mL IVPB  Status:  Discontinued     2 g 100 mL/hr over 30 Minutes Intravenous Every 8 hours 02/28/16 2140 03/01/16 1156   02/28/16 1330  aztreonam (AZACTAM) 2 g in dextrose 5 % 50 mL IVPB     2 g 100 mL/hr over 30 Minutes Intravenous  Once 02/28/16 1317 02/28/16 1417   02/28/16 1330  vancomycin (VANCOCIN) IVPB 1000 mg/200 mL premix     1,000 mg 200 mL/hr over 60 Minutes Intravenous  Once 02/28/16 1317 02/28/16 1737      Subjective: Reports feeling better after moving bowels  Objective: Vitals:   03/08/16 1000 03/08/16 1200 03/08/16 1400 03/08/16 1420  BP: 122/85 (!) 148/64 (!) 127/43   Pulse: 99 82 83 87  Resp: (!) 21 (!) 31 20 (!) 23  Temp:      TempSrc:      SpO2: 90% 93% 97% 95%  Weight:      Height:        Intake/Output Summary (Last 24 hours) at 03/08/16 1454 Last data filed at 03/08/16 1400  Gross per 24 hour  Intake             1064 ml  Output             2175 ml  Net            -1111 ml   Filed Weights   02/28/16 1241 02/29/16 0000  Weight: 83.9 kg (185 lb) 85.2 kg (187 lb 13.3 oz)    Examination:  General exam: Eating breakfast, in no acute distress Respiratory system: Normal respiratory effort, no audible wheezing Cardiovascular system: Regular rhythm, S1-S2 Gastrointestinal system: Soft, positive bowel sounds, nontender Central nervous system: No seizures, no tremors Extremities: No cyanosis, no joint deformities Skin: No rashes, no pallor Psychiatry: Affect normal, no auditory  hallucinations  Data Reviewed: I have personally reviewed following labs and imaging studies  CBC:  Recent Labs Lab 03/03/16 0304 03/04/16 0304 03/05/16 0306 03/06/16 0342 03/07/16 0850  WBC 5.3 5.9 5.4 6.9 6.7  HGB 10.5* 10.4* 10.4* 11.3* 11.3*  HCT 33.9* 33.6* 33.7* 37.3* 36.0*  MCV 92.6 92.8 90.3 93.0 91.1  PLT 165 176 192 204 [id number]   Basic Metabolic Panel:  Recent Labs Lab 03/03/16 0304 03/04/16 0304 03/05/16 0306 03/06/16 0342 03/08/16 0322  NA 141 142 138 140 137  K 3.3* 3.8 4.6 4.3 4.6  CL 97* 101 99* 98* 95*  CO2 38* 37* 35* 34* 37*  GLUCOSE 114* 107* 202* 199* 224*  BUN 22* 19 24* 29* 38*  CREATININE 0.54* 0.55* 0.66 0.52* 0.66  CALCIUM 8.0* 7.9* 8.0* 8.3* 8.2*   GFR: Estimated Creatinine Clearance: 71.4 mL/min (by C-G formula based on SCr of 0.66 mg/dL). Liver Function Tests:  No results for input(s): AST, ALT, ALKPHOS, BILITOT, PROT, ALBUMIN in the last 168 hours. No results for input(s): LIPASE, AMYLASE in the last 168 hours. No results for input(s): AMMONIA in the last 168 hours. Coagulation Profile: No results for input(s): INR, PROTIME in the last 168 hours. Cardiac Enzymes: No results for input(s): CKTOTAL, CKMB, CKMBINDEX, TROPONINI in the last 168 hours. BNP (last 3 results) No results for input(s): PROBNP in the last 8760 hours. HbA1C: No results for input(s): HGBA1C in the last 72 hours. CBG: No results for input(s): GLUCAP in the last 168 hours. Lipid Profile: No results for input(s): CHOL, HDL, LDLCALC, TRIG, CHOLHDL, LDLDIRECT in the last 72 hours. Thyroid Function Tests: No results for input(s): TSH, T4TOTAL, FREET4, T3FREE, THYROIDAB in the last 72 hours. Anemia Panel: No results for input(s): VITAMINB12, FOLATE, FERRITIN, TIBC, IRON, RETICCTPCT in the last 72 hours. Sepsis Labs: No results for input(s): PROCALCITON, LATICACIDVEN in the last 168 hours.  Recent Results (from the past 240 hour(s))  Blood culture (routine x 2)      Status: None   Collection Time: 02/28/16 12:29 PM  Result Value Ref Range Status   Specimen Description BLOOD BLOOD LEFT FOREARM  Final   Special Requests BOTTLES DRAWN AEROBIC AND ANAEROBIC 5CC  Final   Culture   Final    NO GROWTH 5 DAYS Performed at [facility]    Report Status 03/04/2016 FINAL  Final  Blood culture (routine x 2)     Status: None   Collection Time: 02/28/16 12:39 PM  Result Value Ref Range Status   Specimen Description BLOOD BLOOD RIGHT FOREARM  Final   Special Requests BOTTLES DRAWN AEROBIC AND ANAEROBIC 5CC  Final   Culture   Final    NO GROWTH 5 DAYS Performed at [facility]    Report Status 03/04/2016 FINAL  Final  Urine culture     Status: Abnormal   Collection Time: 02/28/16 12:55 PM  Result Value Ref Range Status   Specimen Description URINE, CATHETERIZED  Final   Special Requests NONE  Final   Culture (A)  Final    30,000 COLONIES/mL STAPHYLOCOCCUS SPECIES (COAGULASE NEGATIVE)   Report Status 03/02/2016 FINAL  Final   Organism ID, Bacteria STAPHYLOCOCCUS SPECIES (COAGULASE NEGATIVE) (A)  Final      Susceptibility   Staphylococcus species (coagulase negative) - MIC*    CIPROFLOXACIN >=8 RESISTANT Resistant     GENTAMICIN 8 INTERMEDIATE Intermediate     NITROFURANTOIN <=16 SENSITIVE Sensitive     OXACILLIN >=4 RESISTANT Resistant     TETRACYCLINE >=16 RESISTANT Resistant     VANCOMYCIN 1 SENSITIVE Sensitive     TRIMETH/SULFA 80 RESISTANT Resistant     CLINDAMYCIN >=8 RESISTANT Resistant     RIFAMPIN <=0.5 SENSITIVE Sensitive     Inducible Clindamycin NEGATIVE Sensitive     * 30,000 COLONIES/mL STAPHYLOCOCCUS SPECIES (COAGULASE NEGATIVE)  MRSA PCR Screening     Status: None   Collection Time: 02/28/16 10:44 PM  Result Value Ref Range Status   MRSA by PCR NEGATIVE NEGATIVE Final    Comment:        The GeneXpert MRSA Assay (FDA approved for NASAL specimens only), is one component of a comprehensive MRSA  colonization surveillance program. It is not intended to diagnose MRSA infection nor to guide or monitor treatment for MRSA infections.   Culture, sputum-assessment     Status: None   Collection Time: 03/02/16  8:00 PM  Result Value Ref Range  Status   Specimen Description EXPECTORATED SPUTUM  Final   Special Requests NONE  Final   Sputum evaluation   Final    THIS SPECIMEN IS ACCEPTABLE. RESPIRATORY CULTURE REPORT TO FOLLOW.   Report Status 03/02/2016 FINAL  Final  Culture, respiratory (NON-Expectorated)     Status: None   Collection Time: 03/02/16  8:00 PM  Result Value Ref Range Status   Specimen Description SPUTUM  Final   Special Requests NONE  Final   Gram Stain   Final    ABUNDANT WBC PRESENT,BOTH PMN AND MONONUCLEAR FEW YEAST WITH PSEUDOHYPHAE RARE GRAM POSITIVE RODS    Culture   Final    Consistent with normal respiratory flora. Performed at [facility]    Report Status 03/05/2016 FINAL  Final  Urine culture     Status: None   Collection Time: 03/03/16  2:30 AM  Result Value Ref Range Status   Specimen Description URINE, CATHETERIZED  Final   Special Requests NONE  Final   Culture NO GROWTH Performed at [facility]   Final   Report Status 03/04/2016 FINAL  Final     Radiology Studies: No results found.  Scheduled Meds: . heparin  5,000 Units Subcutaneous Q8H  . levalbuterol  0.63 mg Nebulization TID  . levofloxacin  750 mg Oral Q24H  . mouth rinse  15 mL Mouth Rinse BID  . methylPREDNISolone (SOLU-MEDROL) injection  60 mg Intravenous Q6H  . pantoprazole  40 mg Oral BID AC  . polyethylene glycol  17 g Oral Daily  . sodium chloride  1,000 mL Intravenous Once  . tiotropium  18 mcg Inhalation Daily   Continuous Infusions:   LOS: 9 days   Hershy Flenner, Orpah Melter, MD [facility] Pager [phone]  If 7PM-7AM, please contact night-coverage [email or web address] Password [email or web address] 03/08/2016, 2:54 PM

## 2016-03-08 NOTE — Discharge Instructions (Signed)
[facility] _______[facility] ° °UMBILICAL OR INGUINAL HERNIA REPAIR: POST OP INSTRUCTIONS ° °Always review your discharge instruction sheet given to you by the facility where your surgery was performed. °IF YOU HAVE DISABILITY OR FAMILY LEAVE FORMS, YOU MUST BRING THEM TO THE OFFICE FOR PROCESSING.   °DO NOT GIVE THEM TO YOUR DOCTOR. ° °1. A  prescription for pain medication may be given to you upon discharge.  Take your pain medication as prescribed, if needed.  If narcotic pain medicine is not needed, then you may take acetaminophen (Tylenol) or ibuprofen (Advil) as needed. °2. Take your usually prescribed medications unless otherwise directed. °If you need a refill on your pain medication, please contact your pharmacy.  They will contact our office to request authorization. Prescriptions will not be filled after 5 pm or on week-ends. °3. You should follow a light diet the first 24 hours after arrival home, such as soup and crackers, etc.  Be sure to include lots of fluids daily.  Resume your normal diet the day after surgery. °4.Most patients will experience some swelling and bruising around the umbilicus or in the groin and scrotum.  Ice packs and reclining will help.  Swelling and bruising can take several days to resolve.  °6. It is common to experience some constipation if taking pain medication after surgery.  Increasing fluid intake and taking a stool softener (such as Colace) will usually help or prevent this problem from occurring.  A mild laxative (Milk of Magnesia or Miralax) should be taken according to package directions if there are no bowel movements after 48 hours. °7. Unless discharge instructions indicate otherwise, you may remove your bandages 24-48 hours after surgery, and you may shower at that time.  You may have steri-strips (small skin tapes) in place directly over the incision.  These strips should be left on the skin for 7-10 days.  If your surgeon used skin glue on the  incision, you may shower in 24 hours.  The glue will flake off over the next 2-3 weeks.  Any sutures or staples will be removed at the office during your follow-up visit. °8. ACTIVITIES:  You may resume regular (light) daily activities beginning the next day--such as daily self-care, walking, climbing stairs--gradually increasing activities as tolerated.  You may have sexual intercourse when it is comfortable.  Refrain from any heavy lifting or straining until approved by your doctor. ° °a.You may drive when you are no longer taking prescription pain medication, you can comfortably wear a seatbelt, and you can safely maneuver your car and apply brakes. °b.RETURN TO WORK:   °_____________________________________________ ° °9.You should see your doctor in the office for a follow-up appointment approximately 2-3 weeks after your surgery.  Make sure that you call for this appointment within a day or two after you arrive home to insure a convenient appointment time. °10.OTHER INSTRUCTIONS: _________________________ °   _____________________________________ ° °WHEN TO CALL YOUR DOCTOR: °1. Fever over 101.0 °2. Inability to urinate °3. Nausea and/or vomiting °4. Extreme swelling or bruising °5. Continued bleeding from incision. °6. Increased pain, redness, or drainage from the incision ° °The clinic staff is available to answer your questions during regular business hours.  Please don’t hesitate to call and ask to speak to one of the nurses for clinical concerns.  If you have a medical emergency, go to the nearest emergency room or call 911.  A surgeon from [facility] is always on call at the hospital ° ° °  [address], [address], [place], [place]  [address] ? ° [address] [address], [place], [place]   [address] °[phone] ? [phone] ? FAX [phone] °Web site: [email or web address] °

## 2016-03-08 NOTE — Progress Notes (Signed)
Physical Therapy Treatment Patient Details Name: Allen Beasley MRN: [id number] DOB: [birth year] Today's Date: 03/08/2016    History of Present Illness Allen Beasley is a 82 y.o. male with medical history significant of COPD/emphysema, HTN, HMD, aortic insufficiency; who presents 02/28/16 with complaints of abdominal  cramps, Nausea and vomiting. Patient was recently admitted from 10/26-11/7/17 for sepsis and pneumonia. repair of incarcerated hernia on 11/18.    PT Comments    Pt assisted with ambulating in room.  Pt remained on HFNC FiO2 100% and 25 L/min and pt denies any SOB.  Limited to room distance only due to HFNC attachment to wall.  Follow Up Recommendations  SNF;Supervision/Assistance - 24 hour     Equipment Recommendations  None recommended by PT    Recommendations for Other Services       Precautions / Restrictions Precautions Precautions: Fall Precaution Comments: HFNC    Mobility  Bed Mobility Overal bed mobility: Needs Assistance Bed Mobility: Supine to Sit     Supine to sit: Min assist;HOB elevated     General bed mobility comments: slight assist for trunk  Transfers Overall transfer level: Needs assistance Equipment used: Rolling walker (2 wheeled) Transfers: Sit to/from Stand Sit to Stand: Min assist;+2 safety/equipment;From elevated surface         General transfer comment: verbal cues for safe technique, assist to rise and steady  Ambulation/Gait Ambulation/Gait assistance: Min assist;+2 safety/equipment Ambulation Distance (Feet): 24 Feet Assistive device: Rolling walker (2 wheeled) Gait Pattern/deviations: Step-through pattern;Decreased stride length     General Gait Details: verbal cues for safe use of RW, sats dropped however pulse ox located on finger, SpO2 86% upon sitting in recliner, improved to 95% with pursed lip breathing   Stairs            Wheelchair Mobility    Modified Rankin (Stroke Patients Only)        Balance                                    Cognition Arousal/Alertness: Awake/alert Behavior During Therapy: WFL for tasks assessed/performed Overall Cognitive Status: Within Functional Limits for tasks assessed                      Exercises General Exercises - Lower Extremity Ankle Circles/Pumps: AROM;Both;10 reps;Supine Straight Leg Raises: AROM;Both;5 reps;Supine    General Comments        Pertinent Vitals/Pain Pain Assessment: No/denies pain Pain Intervention(s): Repositioned (geomat cushion in chair)    Home Living                      Prior Function            PT Goals (current goals can now be found in the care plan section) Progress towards PT goals: Progressing toward goals    Frequency    Min 3X/week      PT Plan Current plan remains appropriate    Co-evaluation             End of Session Equipment Utilized During Treatment: Gait belt;Oxygen Activity Tolerance: Patient limited by fatigue Patient left: in chair;with call bell/phone within reach;with chair alarm set;with family/visitor present     Time: [id number] PT Time Calculation (min) (ACUTE ONLY): 21 min  Charges:  $Gait Training: 8-22 mins  G Codes:      Yates Weisgerber,KATHrine E March 11, 2016, 12:07 PM Carmelia Bake, PT, DPT March 11, 2016 Pager: [phone]

## 2016-03-08 NOTE — Progress Notes (Signed)
Date: March 08, 2016 Referral to Ltac done. [name], RN, BSN, [place]   [phone]

## 2016-03-08 NOTE — Patient Care Conference (Signed)
Called and updated patient's niece at phone number listed. All questions answered. Family is in agreement with plan of care.  Discussed LTAC with patient's niece. She understands purpose of LTAC and is in agreement with placement if patient can be transferred. Per CM, referral to LTAC has been done. Will follow.

## 2016-03-08 NOTE — Progress Notes (Signed)
PHARMACIST - PHYSICIAN COMMUNICATION DR:   Wyline Copas CONCERNING: Antibiotic IV to Oral Route Change Policy  RECOMMENDATION: This patient is receiving levaquin by the intravenous route.  Based on criteria approved by the Pharmacy and Therapeutics Committee, the antibiotic(s) is/are being converted to the equivalent oral dose form(s).   DESCRIPTION: These criteria include:  Patient being treated for a respiratory tract infection, urinary tract infection, cellulitis or clostridium difficile associated diarrhea if on metronidazole  The patient is not neutropenic and does not exhibit a GI malabsorption state  The patient is eating (either orally or via tube) and/or has been taking other orally administered medications for a least 24 hours  The patient is improving clinically and has a Tmax < 100.5  If you have questions about this conversion, please contact the Pharmacy Department  []   [phone] )  [facility] []   [phone] )  [facility] []   [phone] )  [facility] []   [phone] )  [facility] [x]   [phone] )  [place], PharmD, BCPS 03/08/2016 1:46 PM

## 2016-03-09 ENCOUNTER — Inpatient Hospital Stay
Admission: RE | Admit: 2016-03-09 | Discharge: 2016-04-02 | Disposition: A | Discharge status: Skilled Nursing Facility | Payer: Medicare Other | Source: Other Acute Inpatient Hospital | Attending: Internal Medicine | Admitting: Internal Medicine

## 2016-03-09 DIAGNOSIS — K4090 Unilateral inguinal hernia, without obstruction or gangrene, not specified as recurrent: Secondary | ICD-10-CM | POA: Diagnosis not present

## 2016-03-09 DIAGNOSIS — R1312 Dysphagia, oropharyngeal phase: Secondary | ICD-10-CM | POA: Diagnosis not present

## 2016-03-09 DIAGNOSIS — J9690 Respiratory failure, unspecified, unspecified whether with hypoxia or hypercapnia: Secondary | ICD-10-CM | POA: Diagnosis present

## 2016-03-09 DIAGNOSIS — K56609 Unspecified intestinal obstruction, unspecified as to partial versus complete obstruction: Secondary | ICD-10-CM | POA: Diagnosis not present

## 2016-03-09 DIAGNOSIS — I351 Nonrheumatic aortic (valve) insufficiency: Secondary | ICD-10-CM | POA: Diagnosis not present

## 2016-03-09 DIAGNOSIS — J188 Other pneumonia, unspecified organism: Secondary | ICD-10-CM | POA: Diagnosis present

## 2016-03-09 DIAGNOSIS — Y95 Nosocomial condition: Secondary | ICD-10-CM | POA: Diagnosis not present

## 2016-03-09 DIAGNOSIS — R262 Difficulty in walking, not elsewhere classified: Secondary | ICD-10-CM | POA: Diagnosis not present

## 2016-03-09 DIAGNOSIS — J441 Chronic obstructive pulmonary disease with (acute) exacerbation: Secondary | ICD-10-CM | POA: Diagnosis not present

## 2016-03-09 DIAGNOSIS — K5900 Constipation, unspecified: Secondary | ICD-10-CM | POA: Diagnosis present

## 2016-03-09 DIAGNOSIS — J9600 Acute respiratory failure, unspecified whether with hypoxia or hypercapnia: Secondary | ICD-10-CM | POA: Diagnosis not present

## 2016-03-09 DIAGNOSIS — K4030 Unilateral inguinal hernia, with obstruction, without gangrene, not specified as recurrent: Secondary | ICD-10-CM | POA: Diagnosis not present

## 2016-03-09 DIAGNOSIS — J439 Emphysema, unspecified: Secondary | ICD-10-CM | POA: Diagnosis not present

## 2016-03-09 DIAGNOSIS — I959 Hypotension, unspecified: Secondary | ICD-10-CM | POA: Diagnosis not present

## 2016-03-09 DIAGNOSIS — T502X5A Adverse effect of carbonic-anhydrase inhibitors, benzothiadiazides and other diuretics, initial encounter: Secondary | ICD-10-CM | POA: Diagnosis not present

## 2016-03-09 DIAGNOSIS — Z881 Allergy status to other antibiotic agents status: Secondary | ICD-10-CM | POA: Diagnosis not present

## 2016-03-09 DIAGNOSIS — J90 Pleural effusion, not elsewhere classified: Secondary | ICD-10-CM | POA: Diagnosis not present

## 2016-03-09 DIAGNOSIS — R498 Other voice and resonance disorders: Secondary | ICD-10-CM | POA: Diagnosis not present

## 2016-03-09 DIAGNOSIS — J852 Abscess of lung without pneumonia: Secondary | ICD-10-CM | POA: Diagnosis not present

## 2016-03-09 DIAGNOSIS — Z880 Allergy status to penicillin: Secondary | ICD-10-CM | POA: Diagnosis not present

## 2016-03-09 DIAGNOSIS — I10 Essential (primary) hypertension: Secondary | ICD-10-CM | POA: Diagnosis present

## 2016-03-09 DIAGNOSIS — D638 Anemia in other chronic diseases classified elsewhere: Secondary | ICD-10-CM | POA: Diagnosis not present

## 2016-03-09 DIAGNOSIS — J850 Gangrene and necrosis of lung: Secondary | ICD-10-CM | POA: Diagnosis not present

## 2016-03-09 DIAGNOSIS — I9581 Postprocedural hypotension: Secondary | ICD-10-CM | POA: Diagnosis not present

## 2016-03-09 DIAGNOSIS — D649 Anemia, unspecified: Secondary | ICD-10-CM | POA: Diagnosis present

## 2016-03-09 DIAGNOSIS — Z9911 Dependence on respirator [ventilator] status: Secondary | ICD-10-CM | POA: Diagnosis not present

## 2016-03-09 DIAGNOSIS — E876 Hypokalemia: Secondary | ICD-10-CM | POA: Diagnosis not present

## 2016-03-09 DIAGNOSIS — J690 Pneumonitis due to inhalation of food and vomit: Secondary | ICD-10-CM | POA: Diagnosis not present

## 2016-03-09 DIAGNOSIS — J449 Chronic obstructive pulmonary disease, unspecified: Secondary | ICD-10-CM | POA: Diagnosis not present

## 2016-03-09 DIAGNOSIS — Z87891 Personal history of nicotine dependence: Secondary | ICD-10-CM | POA: Diagnosis not present

## 2016-03-09 DIAGNOSIS — D696 Thrombocytopenia, unspecified: Secondary | ICD-10-CM | POA: Diagnosis present

## 2016-03-09 DIAGNOSIS — K56699 Other intestinal obstruction unspecified as to partial versus complete obstruction: Secondary | ICD-10-CM | POA: Diagnosis not present

## 2016-03-09 DIAGNOSIS — J9621 Acute and chronic respiratory failure with hypoxia: Secondary | ICD-10-CM | POA: Diagnosis not present

## 2016-03-09 DIAGNOSIS — Z9981 Dependence on supplemental oxygen: Secondary | ICD-10-CM | POA: Diagnosis not present

## 2016-03-09 DIAGNOSIS — Z9842 Cataract extraction status, left eye: Secondary | ICD-10-CM | POA: Diagnosis not present

## 2016-03-09 DIAGNOSIS — Z809 Family history of malignant neoplasm, unspecified: Secondary | ICD-10-CM | POA: Diagnosis not present

## 2016-03-09 DIAGNOSIS — M6281 Muscle weakness (generalized): Secondary | ICD-10-CM | POA: Diagnosis not present

## 2016-03-09 DIAGNOSIS — E785 Hyperlipidemia, unspecified: Secondary | ICD-10-CM | POA: Diagnosis present

## 2016-03-09 DIAGNOSIS — J189 Pneumonia, unspecified organism: Secondary | ICD-10-CM | POA: Diagnosis not present

## 2016-03-09 DIAGNOSIS — Z48815 Encounter for surgical aftercare following surgery on the digestive system: Secondary | ICD-10-CM | POA: Diagnosis not present

## 2016-03-09 DIAGNOSIS — J9601 Acute respiratory failure with hypoxia: Secondary | ICD-10-CM | POA: Diagnosis not present

## 2016-03-09 DIAGNOSIS — Z96641 Presence of right artificial hip joint: Secondary | ICD-10-CM | POA: Diagnosis not present

## 2016-03-09 DIAGNOSIS — R531 Weakness: Secondary | ICD-10-CM | POA: Diagnosis present

## 2016-03-09 DIAGNOSIS — Z8249 Family history of ischemic heart disease and other diseases of the circulatory system: Secondary | ICD-10-CM | POA: Diagnosis not present

## 2016-03-09 DIAGNOSIS — K219 Gastro-esophageal reflux disease without esophagitis: Secondary | ICD-10-CM | POA: Diagnosis present

## 2016-03-09 DIAGNOSIS — Z9841 Cataract extraction status, right eye: Secondary | ICD-10-CM | POA: Diagnosis not present

## 2016-03-09 MED ORDER — HYDROMORPHONE HCL 1 MG/ML IJ SOLN: 0.5000 mg | INTRAMUSCULAR | 0 refills | Status: AC | PRN

## 2016-03-09 MED ORDER — HEPARIN SODIUM (PORCINE) 5000 UNIT/ML IJ SOLN: 5000.0000 [IU] | Freq: Three times a day (TID) | INTRAMUSCULAR | Status: AC

## 2016-03-09 MED ORDER — METHYLPREDNISOLONE SODIUM SUCC 125 MG IJ SOLR: 60.0000 mg | Freq: Four times a day (QID) | INTRAMUSCULAR | 0 refills | Status: AC

## 2016-03-09 MED ORDER — ONDANSETRON HCL 4 MG/2ML IJ SOLN: 4.0000 mg | Freq: Four times a day (QID) | INTRAMUSCULAR | 0 refills | Status: AC | PRN

## 2016-03-09 NOTE — Discharge Summary (Addendum)
Physician Discharge Summary  Allen Beasley [id number] DOB: [birth year] DOA: 02/28/2016  PCP: Geoffery Lyons, MD  Admit date: 02/28/2016 Discharge date: 03/09/2016  Admitted From: Home Disposition:  LTAC ([place])  Recommendations for Outpatient Follow-up:  1. After discharge, recommend follow up with PCP in 1-2 weeks 2. Follow up with Surgery as scheduled after discharge 3. Please remove staples on 03/13/16 (2 weeks after surgery) 4. Anticipated stop date for levaquin is 03/10/16 (10 days of treatment)  Discharge Condition:Stable CODE STATUS:Full Diet recommendation: Regular   Brief/Interim Summary: 82 year old male with COPD, aortic insufficiency, hypertension, hyperlipidemia, was recently admitted from 10/26-11/7/17 for sepsis and necrotizing pneumonia, was discharged with a 4-week course of Levaquin. He returned to the ER with nausea, vomiting, abdominal distention, pain, and inability to have a BM. CT scan ab/p demonstrated an incarcerated direct right inguinal hernia. He underwent urgent primary repair by Dr. Barry Dienes on 11/18. He was persistently hypotensive post-operatively with decreased uop and required multiple boluses and IVF. On 11/20, he developed worsening respiratory distress and despite multiple doses of lasix, required nonrebreather. CXR demonstrated worsening RLL pneumonia and patient was already on broad spectrum antibiotics. Suspect aspiration pneumonia. PCCM was consulted who recommended bipap with close monitoring in case of further respiratory compromise. Despite his recent abdominal surgery, he tolerated bipap well and was changed to HFNC on 11/21.  SBO - secondary to incarcerated R inguinal hernia and now s/p surgical repair by Dr. Barry Dienes on 02/28/16 - Patient is tolerating diet without difficulty -Surgery continues to follow. Recs to follow up with Dr. Barry Dienes in 2-4 weeks post discharge - Positive bowel sounds on exam. Patient tolerating  diet -Discussed case with General Surgery today. Recs to continue to focus on bowel movements - constipation resolved with cathartics  - Surgery recommends removing staples 2 weeks out from surgery (03/13/16)  Acute on chronic respiratory failure due to COPD with emphysema ([place]) and recent pneumoniawith probable superimposed aspiration pneumonia - Earlier CXR with findings suggestive of RLL - Continue NSAIDs, incentive spirometry, flutter valve as tolerated - PCCM had recommended for total 10 days of antibiotics, as needed lasix as kidneys/blood pressure tolerates - Patient given doses of lasix on 11/25 and 11/26, no significant improvement with O2 requirements - Discussed case with Pulmonary, who recommend continuing current plan.  - Will likely benefit from LTAC for slow weaning - Stop date for levaquin is 03/10/16 (total 10 days treatment)  HTN (hypertension), -Low pressures noted early this admission -Blood pressures have improved, vital signs reviewed -BP remains controlled without BP meds  Esophageal reflux,  - Remains stable at present - Tolerating diet without difficulty  Hypokalemia due to diuresis - Resolved  Normocyticanemia - Hemoglobin remained stable.  - most recent hgb of 11.3  Constipation -Improved with cathartics -Please continue cathartics as needed to ensure regular bowel movements  Discharge Diagnoses:  Principal Problem:   Incarcerated right inguinal hernia s/p primary repair 02/28/2016 Active Problems:   COPD with emphysema (HCC)   HTN (hypertension)   Acute on chronic respiratory failure with hypoxia (HCC)   Esophageal reflux   SBO (small bowel obstruction)   Multifocal pneumonia   HCAP (healthcare-associated pneumonia)    Discharge Instructions     Medication List    STOP taking these medications   furosemide 20 MG tablet Commonly known as:  LASIX   magnesium citrate Soln   senna-docusate 8.6-50 MG tablet Commonly known as:   Senokot-S     TAKE these medications  guaiFENesin 600 MG 12 hr tablet Commonly known as:  MUCINEX Take 1 tablet (600 mg total) by mouth 2 (two) times daily.   heparin 5000 UNIT/ML injection Inject 1 mL (5,000 Units total) into the skin every 8 (eight) hours.   HYDROmorphone 1 MG/ML injection Commonly known as:  DILAUDID Inject 0.5 mLs (0.5 mg total) into the vein every 4 (four) hours as needed for moderate pain or severe pain.   lactobacillus acidophilus & bulgar chewable tablet Chew 2 tablets by mouth 3 (three) times daily with meals.   levalbuterol 0.63 MG/3ML nebulizer solution Commonly known as:  XOPENEX Take 3 mLs (0.63 mg total) by nebulization every 4 (four) hours as needed for wheezing or shortness of breath.   levofloxacin 750 MG tablet Commonly known as:  LEVAQUIN Take 1 tablet (750 mg total) by mouth daily.   methylPREDNISolone sodium succinate 125 mg/2 mL injection Commonly known as:  SOLU-MEDROL Inject 0.96 mLs (60 mg total) into the vein every 6 (six) hours.   ondansetron 4 MG/2ML Soln injection Commonly known as:  ZOFRAN Inject 2 mLs (4 mg total) into the vein every 6 (six) hours as needed for nausea or vomiting.   pantoprazole 40 MG tablet Commonly known as:  PROTONIX Take 1 tablet (40 mg total) by mouth daily at 6 (six) AM.   polyethylene glycol packet Commonly known as:  MIRALAX / GLYCOLAX Take 17 g by mouth daily.   simvastatin 80 MG tablet Commonly known as:  ZOCOR Take 80 mg by mouth daily.   sodium phosphate Pediatric 3.5-9.5 GM/59ML enema Place 1 enema rectally once.   SPIRIVA HANDIHALER 18 MCG inhalation capsule Generic drug:  tiotropium PLACE 1 CAPSULE INTO INHALER AND INHALE THE CONTENTS OF 1 CAPSULE ONCE DAILY   sucralfate 1 GM/10ML suspension Commonly known as:  CARAFATE Take 10 mLs (1 g total) by mouth 2 (two) times daily. Take at breakfast at bedtime.  TAKE AT LEAST 2 HOURS APART FROM LEVOFLOXACIN   temazepam 15 MG  capsule Commonly known as:  RESTORIL Take 15 mg by mouth at bedtime as needed.   terazosin 2 MG capsule Commonly known as:  HYTRIN Take 2 mg by mouth at bedtime.      Follow-up Information    BYERLY,FAERA, MD. Schedule an appointment as soon as possible for a visit in 3 week(s).   Specialty:  General Surgery Why:  for follow up regarding hernia repair Contact information: [place] [address] [phone]        ARONSON,RICHARD A, MD. Schedule an appointment as soon as possible for a visit in 2 week(s).   Specialty:  Internal Medicine Why:  after discharge Contact information: [address] [place] [address] [phone]          Allergies  Allergen Reactions  . Penicillins Hives and Swelling  . Clindamycin/Lincomycin Rash    Consultations:  General Surgery  Pulmonary  Procedures/Studies: Dg Chest 2 View  Result Date: 02/11/2016 CLINICAL DATA:  Acute on chronic respiratory failure with hypoxia patient reports shortness of breath and weakness today. History of COPD, aortic insufficiency. EXAM: CHEST  2 VIEW COMPARISON:  Chest x-ray and chest CT scan of February 09, 2016 FINDINGS: The lungs remain hyperinflated. There remains increased density at the left lung base compatible with pneumonia and a known pleural effusion. The heart and pulmonary vascularity are normal. There is calcification in the wall of the thoracic aorta. The mediastinum is normal in width. The bony thorax  exhibits no acute abnormality. IMPRESSION: Persistent left lower lobe pneumonia and pleural effusion not greatly changed from the previous study. Underlying COPD. No CHF. Aortic atherosclerosis. Electronically Signed   By: David  Martinique M.D.   On: 02/11/2016 12:40   Dg Chest 2 View  Result Date: 02/09/2016 CLINICAL DATA:  Shortness of breath and wheezing. EXAM: CHEST  2 VIEW COMPARISON:  02/04/2016 and CT chest 06/19/2013. FINDINGS: Patient is rotated. Heart  size stable. Thoracic aorta is calcified. Lungs are emphysematous. Airspace opacification is seen at the base of the left hemi thorax with a small left pleural effusion. Right lung is grossly clear. Tiny right pleural effusion. Degenerative changes are seen in the spine. IMPRESSION: 1. Left basilar airspace opacification may be due to pneumonia. Followup PA and lateral chest X-ray is recommended in 3-4 weeks following trial of antibiotic therapy to ensure resolution and exclude underlying malignancy. 2. Small bilateral pleural effusions, left greater than right. 3.  Aortic atherosclerosis (ICD10-170.0). Electronically Signed   By: Lorin Picket M.D.   On: 02/09/2016 08:58   Dg Abd 1 View  Result Date: 02/29/2016 CLINICAL DATA:  Small bowel obstruction EXAM: ABDOMEN - 1 VIEW COMPARISON:  CT from yesterday FINDINGS: Improved maximal distention of small bowel loops. Many loops were fluid-filled previously, limiting sensitivity of radiography. No concerning mass effect or gas collection. The nasogastric tube tip is coiled on itself at the level of the lower esophagus and gastric cardia, likely limiting utility. Atherosclerosis. These results will be called to the ordering clinician or representative by the Radiologist Assistant, and communication documented in the PACS or zVision Dashboard. IMPRESSION: 1. Improved small bowel obstruction pattern after right inguinal hernia repair. 2. Malpositioned nasogastric tube which is coiled on itself at the GE junction, likely limiting effectiveness. Consider advancement and follow-up. Electronically Signed   By: Monte Fantasia M.D.   On: 02/29/2016 08:12   Ct Angio Chest Pe W Or Wo Contrast  Result Date: 02/09/2016 CLINICAL DATA:  Aortic insufficiency sepsis, COPD exacerbation with acute respiratory failure and hypoxia EXAM: CT ANGIOGRAPHY CHEST WITH CONTRAST TECHNIQUE: Multidetector CT imaging of the chest was performed using the standard protocol during bolus  administration of intravenous contrast. Multiplanar CT image reconstructions and MIPs were obtained to evaluate the vascular anatomy. CONTRAST:  100 mL Isovue 370 intravenous COMPARISON:  Chest x-ray 01/1929 2017, CT scan chest 06/19/2013 FINDINGS: Cardiovascular: No evidence for filling defect within the central or segmental pulmonary arteries to suggest the presence of an acute embolus. There is diffuse ectasia of the thoracic aorta with atherosclerosis. Coronary artery calcifications. No dissection or mediastinal hematoma. Heart size is nonenlarged. Trace pericardial effusion or thickening. Mediastinum/Nodes: There is mild thickening of the esophagus. Nonspecific sub cm mediastinal lymph nodes. No enlarged hilar or axillary nodes. Trachea and mainstem bronchi are within normal limits. Lungs/Pleura: Trace right-sided pleural effusion. Small to moderate left-sided pleural effusion with mild loculation at the left lower lobe apex. Marked emphysematous disease within the bilateral lungs. There is a focal subpleural airspace density in the lingula which demonstrates central low attenuation suggesting necrosis. There is atelectasis or infiltrate within the left lower lobe. There is streaky infiltrate in the right lower lobe. Airspace disease within the lingula. No pneumothorax. Upper Abdomen: Images through the upper abdomen demonstrate no acute abnormalities. Left adrenal nodularity unchanged. Musculoskeletal: Multilevel degenerative changes of the spine with exuberant anterior osteophytes at the cervical thoracic junction. Review of the MIP images confirms the above findings. IMPRESSION: 1. No CT evidence for  acute pulmonary embolus or dissection. Mild diffuse ectasia of the thoracic aorta with atherosclerosis. 2. Trace right-sided pleural effusion, small to moderate left-sided pleural effusion with mild loculation at the apical portion of left lower lobe. Hazy infiltrate in the right lower lobe and atelectasis or  mild infiltrate left lower lobe. Focal subpleural masslike area of consolidation within the lingula which demonstrates central low attenuation. This could represent a focus of necrotic pneumonia or a possible septic embolus. 3. Marked emphysematous disease of the bilateral lungs Electronically Signed   By: Donavan Foil M.D.   On: 02/09/2016 21:59   Ct Abdomen Pelvis W Contrast  Result Date: 02/28/2016 CLINICAL DATA:  Abdominal pain with nausea and vomiting EXAM: CT ABDOMEN AND PELVIS WITH CONTRAST TECHNIQUE: Multidetector CT imaging of the abdomen and pelvis was performed using the standard protocol following bolus administration of intravenous contrast. CONTRAST:  22mL ISOVUE-300 IOPAMIDOL (ISOVUE-300) INJECTION 61% COMPARISON:  CT abdomen and pelvis February 06, 2016 ; abdominal radiographic series February 28, 2016 FINDINGS: Lower chest: There is airspace consolidation throughout much of the left lower lobe and inferior lingula with moderate pleural effusion on the left. There is patchy consolidation in the medial and posterior segments of the right lower lobe. There is atelectatic change in the right middle lobe. There is a focal stable nodular opacity in the lateral segment of the right lower lobe measuring 8 x 6 mm. There are multiple foci of coronary artery calcification. There is a hiatal hernia. Hepatobiliary: No focal liver lesions are evident. Gallbladder wall is not appreciably thickened. There is no biliary duct dilatation. Pancreas: There is no pancreatic mass or inflammatory focus. Spleen: No splenic lesions are evident. Adrenals/Urinary Tract: The right adrenal appears normal. There is an apparent adenoma in the left adrenal measuring 1.2 x 1.2 cm. Kidneys bilaterally show no mass or hydronephrosis on either side. There is moderate prominence of renal sinus fat bilaterally. There is no renal or ureteral calculus bilaterally. Urinary bladder is midline with overall wall thickness within normal  limits. There is a diverticulum arising from the rightward aspect of the urinary bladder measuring 3.4 x 2.2 cm. Note also that there is mild irregular thickening along the posterior aspect of the urinary bladder wall. Stomach/Bowel: There is proximal small bowel obstruction which extends to the level of a right inguinal hernia. Bowel extends into this hernia. A transition zone is noted at the level of the right inguinal hernia indicative of a degree of bowel obstruction. This appearance suggests that there may be a degree of incarceration in this right inguinal hernia. There is no appreciable bowel wall or mesenteric thickening. Nasogastric tube tip is in the stomach. Stomach is filled with fluid and air. No free air or portal venous air is evident. No bowel pneumatosis evident. Vascular/Lymphatic: There is extensive atherosclerotic calcification throughout the aorta and major pelvic arterial vessels extending to the proximal thigh regions. There is dilatation of the distal abdominal aorta with a maximum transverse diameter of 3.0 x 3.0 cm. There is no periaortic fluid. There is calcification at the origins of the major mesenteric vessels. There is no appreciable adenopathy in the abdomen or pelvis. Reproductive: Prostate is prominent, containing multiple calcifications. There is no pelvic mass outside of the prostate and potential urinary bladder. No pelvic fluid collection evident. Other: Appendix appears unremarkable. No abscess or ascites evident in the abdomen or pelvis. In addition to the apparent incarcerated right inguinal hernia. There is fat in the left inguinal ring. Musculoskeletal: There  is extensive lumbar spine osteoarthritic change. There is spinal stenosis at L2-3, L3-4, L4-5, and L5-S1 due to extensive bony hypertrophy and diffuse disc protrusion. There are no blastic or lytic bone lesions. There is no intramuscular or abdominal wall lesion. Note that the patient has had a previous total hip  replacement right. There is moderate osteoarthritic change in the left hip joint. IMPRESSION: Bowel obstruction at the level of a right inguinal hernia. Suspect a degree of incarceration in this hernia. Transition zone noted at the level of this hernia near the jejunoileal junction. No free air. Extensive airspace consolidation in the inferior lingula and left lower lobe with fairly small left pleural effusion. There is also patchy infiltrate in the posterior and medial segments of the right lower lobe. 7 x 5 mm nodular opacity right lower lobe. Non-contrast chest CT at 6-12 months is recommended. If the nodule is stable at time of repeat CT, then future CT at 18-24 months (from today's scan) is considered optional for low-risk patients, but is recommended for high-risk patients. This recommendation follows the consensus statement: Guidelines for Management of Incidental Pulmonary Nodules Detected on CT Images: From the Fleischner Society 2017; Radiology 2017; 284:228-243. Mild irregularity along the urinary bladder wall posteriorly raises concern for potential inflammation or early neoplasm. This finding may well warrant direct visualization/cystoscopy to further evaluate. There is a rightward located urinary bladder diverticulum. No renal or ureteral calculus. No hydronephrosis. Prominent prostate with multiple prostatic calculi. Advise correlation with PSA. Small left adrenal adenoma. Extensive atherosclerotic calcification in the aorta and essentially all abdominal and pelvic arterial vessels as well as coronary artery calcification. Mild dilatation of the distal aorta. Recommend followup by ultrasound in 3 years. This recommendation follows ACR consensus guidelines: White Paper of the ACR Incidental Findings Committee II on Vascular Findings. J Am Coll Radiol 2013; 10:789-794. Multilevel spinal stenosis, multifactorial. Small hiatal hernia. Stomach filled with fluid and air. Nasogastric tube tip in stomach.  Critical Value/emergent results were called by telephone at the time of interpretation on 02/28/2016 at 3:05 pm to Dr. Lajean Saver , who verbally acknowledged these results. Electronically Signed   By: Lowella Grip III M.D.   On: 02/28/2016 15:05   Dg Esophagus  Result Date: 02/10/2016 CLINICAL DATA:  Dysphagia.  Choked on foods this morning EXAM: ESOPHOGRAM/BARIUM SWALLOW TECHNIQUE: Single contrast examination was performed using  thin barium. FLUOROSCOPY TIME:  Fluoroscopy Time:  54 seconds Radiation Exposure Index (if provided by the fluoroscopic device): 3.8 mGy Number of Acquired Spot Images: 0 COMPARISON:  Chest CT 02/09/2016 FINDINGS: Fluoroscopic evaluation of swallowing demonstrates disruption of all primary esophageal peristaltic waves with stasis of contrast in the esophagus and occasional distal tertiary contractions. No fixed stricture, fold thickening or mass. IMPRESSION: Esophageal dysmotility.  No fixed stricture. Electronically Signed   By: Rolm Baptise M.D.   On: 02/10/2016 14:36   Dg Chest Port 1 View  Result Date: 03/05/2016 CLINICAL DATA:  History of pneumonia EXAM: PORTABLE CHEST 1 VIEW COMPARISON:  03/01/16 FINDINGS: Cardiomediastinal silhouette is stable. Persistent infiltrates right upper lobe and bilateral lower lobe. Small bilateral pleural effusion. Multifocal pneumonia is suspected. IMPRESSION: Persistent infiltrates right upper lobe and bilateral lower lobe. Small bilateral pleural effusion. Multifocal pneumonia is suspected. Follow-up to resolution is recommended. Electronically Signed   By: Lahoma Crocker M.D.   On: 03/05/2016 08:56   Dg Chest Port 1 View  Result Date: 03/01/2016 CLINICAL DATA:  COPD, hypertension, rales, hypoxia, history pneumonia EXAM: PORTABLE CHEST 1  VIEW COMPARISON:  Portable exam 0750 hours compared to 02/28/2016 FINDINGS: Slightly rotated to the RIGHT. Nasogastric tube extends to at least the distal esophagus, unable to visualize tip due to  light technique. Stable heart size. Increased bibasilar opacities particularly RIGHT lower lobe consistent with pneumonia. Persistent RIGHT upper lobe infiltrate. Underlying COPD changes. No pneumothorax. Bones demineralized. IMPRESSION: Persistent RIGHT upper lobe infiltrate with increased bibasilar opacities question pneumonia, especially RIGHT lower lobe. Electronically Signed   By: Lavonia Dana M.D.   On: 03/01/2016 08:12   Dg Abd Acute W/chest  Result Date: 02/28/2016 CLINICAL DATA:  Constipation.  COPD. EXAM: DG ABDOMEN ACUTE W/ 1V CHEST COMPARISON:  02/11/2016 FINDINGS: New patchy airspace opacity in the right upper lobe. Continued opacity at the left lung base. Underlying COPD. Heart is normal size. No effusions. Dilated small bowel loops in the mid abdomen with air-fluid levels concerning for small bowel obstruction. There is a paucity of gas in the colon. No free air organomegaly. IMPRESSION: COPD. Airspace opacities in the left lower lobe and right upper lobe concerning for multifocal pneumonia. Dilated small bowel loops in the mid abdomen concerning for small bowel obstruction. Electronically Signed   By: Rolm Baptise M.D.   On: 02/28/2016 11:48    Subjective: No complaints today  Discharge Exam: Vitals:   03/09/16 1325 03/09/16 1341  BP:  (!) 116/39  Pulse: 84 83  Resp: (!) 24 20  Temp:     Vitals:   03/09/16 1215 03/09/16 1325 03/09/16 1338 03/09/16 1341  BP:    (!) 116/39  Pulse: 79 84  83  Resp: 17 (!) 24  20  Temp:      TempSrc:      SpO2: (!) 87% 90% 96% 94%  Weight:      Height:        General: Pt is alert, awake, not in acute distress Cardiovascular: RRR, S1/S2 +, no rubs, no gallops Respiratory: CTA bilaterally, no wheezing, no rhonchi Abdominal: Soft, NT, ND, bowel sounds + Extremities: no edema, no cyanosis   The results of significant diagnostics from this hospitalization (including imaging, microbiology, ancillary and laboratory) are listed below for  reference.     Microbiology: Recent Results (from the past 240 hour(s))  MRSA PCR Screening     Status: None   Collection Time: 02/28/16 10:44 PM  Result Value Ref Range Status   MRSA by PCR NEGATIVE NEGATIVE Final    Comment:        The GeneXpert MRSA Assay (FDA approved for NASAL specimens only), is one component of a comprehensive MRSA colonization surveillance program. It is not intended to diagnose MRSA infection nor to guide or monitor treatment for MRSA infections.   Culture, sputum-assessment     Status: None   Collection Time: 03/02/16  8:00 PM  Result Value Ref Range Status   Specimen Description EXPECTORATED SPUTUM  Final   Special Requests NONE  Final   Sputum evaluation   Final    THIS SPECIMEN IS ACCEPTABLE. RESPIRATORY CULTURE REPORT TO FOLLOW.   Report Status 03/02/2016 FINAL  Final  Culture, respiratory (NON-Expectorated)     Status: None   Collection Time: 03/02/16  8:00 PM  Result Value Ref Range Status   Specimen Description SPUTUM  Final   Special Requests NONE  Final   Gram Stain   Final    ABUNDANT WBC PRESENT,BOTH PMN AND MONONUCLEAR FEW YEAST WITH PSEUDOHYPHAE RARE GRAM POSITIVE RODS    Culture   Final  Consistent with normal respiratory flora. Performed at [facility]    Report Status 03/05/2016 FINAL  Final  Urine culture     Status: None   Collection Time: 03/03/16  2:30 AM  Result Value Ref Range Status   Specimen Description URINE, CATHETERIZED  Final   Special Requests NONE  Final   Culture NO GROWTH Performed at [facility]   Final   Report Status 03/04/2016 FINAL  Final     Labs: BNP (last 3 results) No results for input(s): BNP in the last 8760 hours. Basic Metabolic Panel:  Recent Labs Lab 03/03/16 0304 03/04/16 0304 03/05/16 0306 03/06/16 0342 03/08/16 0322  NA 141 142 138 140 137  K 3.3* 3.8 4.6 4.3 4.6  CL 97* 101 99* 98* 95*  CO2 38* 37* 35* 34* 37*  GLUCOSE 114* 107* 202* 199* 224*  BUN  22* 19 24* 29* 38*  CREATININE 0.54* 0.55* 0.66 0.52* 0.66  CALCIUM 8.0* 7.9* 8.0* 8.3* 8.2*   Liver Function Tests: No results for input(s): AST, ALT, ALKPHOS, BILITOT, PROT, ALBUMIN in the last 168 hours. No results for input(s): LIPASE, AMYLASE in the last 168 hours. No results for input(s): AMMONIA in the last 168 hours. CBC:  Recent Labs Lab 03/03/16 0304 03/04/16 0304 03/05/16 0306 03/06/16 0342 03/07/16 0850  WBC 5.3 5.9 5.4 6.9 6.7  HGB 10.5* 10.4* 10.4* 11.3* 11.3*  HCT 33.9* 33.6* 33.7* 37.3* 36.0*  MCV 92.6 92.8 90.3 93.0 91.1  PLT 165 176 192 204 230   Cardiac Enzymes: No results for input(s): CKTOTAL, CKMB, CKMBINDEX, TROPONINI in the last 168 hours. BNP: Invalid input(s): POCBNP CBG: No results for input(s): GLUCAP in the last 168 hours. D-Dimer No results for input(s): DDIMER in the last 72 hours. Hgb A1c No results for input(s): HGBA1C in the last 72 hours. Lipid Profile No results for input(s): CHOL, HDL, LDLCALC, TRIG, CHOLHDL, LDLDIRECT in the last 72 hours. Thyroid function studies No results for input(s): TSH, T4TOTAL, T3FREE, THYROIDAB in the last 72 hours.  Invalid input(s): FREET3 Anemia work up No results for input(s): VITAMINB12, FOLATE, FERRITIN, TIBC, IRON, RETICCTPCT in the last 72 hours. Urinalysis    Component Value Date/Time   COLORURINE AMBER (A) 02/28/2016 1255   APPEARANCEUR CLOUDY (A) 02/28/2016 1255   LABSPEC 1.022 02/28/2016 1255   PHURINE 6.0 02/28/2016 1255   GLUCOSEU NEGATIVE 02/28/2016 1255   HGBUR LARGE (A) 02/28/2016 1255   BILIRUBINUR SMALL (A) 02/28/2016 1255   KETONESUR NEGATIVE 02/28/2016 1255   PROTEINUR 30 (A) 02/28/2016 1255   UROBILINOGEN 0.2 08/19/2012 1511   NITRITE NEGATIVE 02/28/2016 1255   LEUKOCYTESUR SMALL (A) 02/28/2016 1255   Sepsis Labs Invalid input(s): PROCALCITONIN,  WBC,  LACTICIDVEN Microbiology Recent Results (from the past 240 hour(s))  MRSA PCR Screening     Status: None   Collection  Time: 02/28/16 10:44 PM  Result Value Ref Range Status   MRSA by PCR NEGATIVE NEGATIVE Final    Comment:        The GeneXpert MRSA Assay (FDA approved for NASAL specimens only), is one component of a comprehensive MRSA colonization surveillance program. It is not intended to diagnose MRSA infection nor to guide or monitor treatment for MRSA infections.   Culture, sputum-assessment     Status: None   Collection Time: 03/02/16  8:00 PM  Result Value Ref Range Status   Specimen Description EXPECTORATED SPUTUM  Final   Special Requests NONE  Final   Sputum evaluation  Final    THIS SPECIMEN IS ACCEPTABLE. RESPIRATORY CULTURE REPORT TO FOLLOW.   Report Status 03/02/2016 FINAL  Final  Culture, respiratory (NON-Expectorated)     Status: None   Collection Time: 03/02/16  8:00 PM  Result Value Ref Range Status   Specimen Description SPUTUM  Final   Special Requests NONE  Final   Gram Stain   Final    ABUNDANT WBC PRESENT,BOTH PMN AND MONONUCLEAR FEW YEAST WITH PSEUDOHYPHAE RARE GRAM POSITIVE RODS    Culture   Final    Consistent with normal respiratory flora. Performed at [facility]    Report Status 03/05/2016 FINAL  Final  Urine culture     Status: None   Collection Time: 03/03/16  2:30 AM  Result Value Ref Range Status   Specimen Description URINE, CATHETERIZED  Final   Special Requests NONE  Final   Culture NO GROWTH Performed at [facility]   Final   Report Status 03/04/2016 FINAL  Final     SIGNED:   Donne Hazel, MD  [facility] 03/09/2016, 1:47 PM  If 7PM-7AM, please contact night-coverage [email or web address] Password [email or web address]

## 2016-03-09 NOTE — Progress Notes (Signed)
Patient assisted to [facility] for BM, legs weak, assisted to floor.  Able to stand with 2 person assistance, to [facility].  Back to chair with 2 person assist . Denies pain or injury, VSS.  Family called

## 2016-03-09 NOTE — Consult Note (Signed)
   [facility] Inpatient Consult   03/09/2016  Allen Beasley [birth year] [id number]    [facility] Care Management follow up. Went to bedside. However, [name] was having nursing care. He remains in stepdown/ICU. Chart reviewed. Noted disposition plans may be for SNF or LTAC. Will make appropriate [facility] Care Management referral once disposition is known. Will continue to follow. Left voicemail for inpatient RNCM to make aware that [facility] Management is following.    Marthenia Rolling, MSN-Ed, RN,BSN [facility] Liaison [phone]

## 2016-03-09 NOTE — Consult Note (Signed)
   [facility] CM Inpatient Consult   03/09/2016  Allen Beasley February 09, 1924 [id number]   Chart reviewed. Noted [name] will being going to [facility] post hospital discharge. Will make Telephonic RNCM aware.   Marthenia Rolling, MSN-Ed, RN,BSN [facility] Liaison [phone]

## 2016-03-09 NOTE — Progress Notes (Signed)
Date: March 09, 2016 Discharge orders checked for needs. No case management needs present at time of discharge. [name], RN, BSN, [place]   [phone]

## 2016-03-09 NOTE — Progress Notes (Signed)
Report called to receiving RN at [facility].  Going to room 5E30, Dr. Laren Everts will be receiving MD.  Transport via [facility] pt requiring high Flow o2

## 2016-03-10 ENCOUNTER — Other Ambulatory Visit (HOSPITAL_COMMUNITY): Payer: Medicare Other

## 2016-03-10 DIAGNOSIS — J189 Pneumonia, unspecified organism: Secondary | ICD-10-CM | POA: Diagnosis not present

## 2016-03-10 LAB — CBC WITH DIFFERENTIAL/PLATELET
BASOS ABS: 0 10*3/uL (ref 0.0–0.1)
BASOS PCT: 0 %
Eosinophils Absolute: 0 10*3/uL (ref 0.0–0.7)
Eosinophils Relative: 0 %
HEMATOCRIT: 38.7 % — AB (ref 39.0–52.0)
HEMOGLOBIN: 12 g/dL — AB (ref 13.0–17.0)
Lymphocytes Relative: 7 %
Lymphs Abs: 0.8 10*3/uL (ref 0.7–4.0)
MCH: 28.2 pg (ref 26.0–34.0)
MCHC: 31 g/dL (ref 30.0–36.0)
MCV: 90.8 fL (ref 78.0–100.0)
MONO ABS: 0.4 10*3/uL (ref 0.1–1.0)
Monocytes Relative: 3 %
NEUTROS ABS: 10.7 10*3/uL — AB (ref 1.7–7.7)
NEUTROS PCT: 90 %
Platelets: 188 10*3/uL (ref 150–400)
RBC: 4.26 MIL/uL (ref 4.22–5.81)
RDW: 15.1 % (ref 11.5–15.5)
WBC: 11.9 10*3/uL — AB (ref 4.0–10.5)

## 2016-03-10 LAB — URINE MICROSCOPIC-ADD ON: WBC, UA: NONE SEEN WBC/hpf (ref 0–5)

## 2016-03-10 LAB — URINALYSIS, ROUTINE W REFLEX MICROSCOPIC
Bilirubin Urine: NEGATIVE
Glucose, UA: NEGATIVE mg/dL
KETONES UR: NEGATIVE mg/dL
Leukocytes, UA: NEGATIVE
Nitrite: NEGATIVE
PROTEIN: NEGATIVE mg/dL
Specific Gravity, Urine: 1.024 (ref 1.005–1.030)
pH: 6 (ref 5.0–8.0)

## 2016-03-10 LAB — COMPREHENSIVE METABOLIC PANEL
ALBUMIN: 2.5 g/dL — AB (ref 3.5–5.0)
ALT: 14 U/L — ABNORMAL LOW (ref 17–63)
ANION GAP: 8 (ref 5–15)
AST: 15 U/L (ref 15–41)
Alkaline Phosphatase: 45 U/L (ref 38–126)
BUN: 32 mg/dL — ABNORMAL HIGH (ref 6–20)
CHLORIDE: 95 mmol/L — AB (ref 101–111)
CO2: 36 mmol/L — AB (ref 22–32)
Calcium: 8.8 mg/dL — ABNORMAL LOW (ref 8.9–10.3)
Creatinine, Ser: 0.73 mg/dL (ref 0.61–1.24)
GFR calc non Af Amer: >60 mL/min (ref >60)
GLUCOSE: 217 mg/dL — AB (ref 65–99)
POTASSIUM: 4.6 mmol/L (ref 3.5–5.1)
SODIUM: 139 mmol/L (ref 135–145)
Total Bilirubin: 0.7 mg/dL (ref 0.3–1.2)
Total Protein: 5.4 g/dL — ABNORMAL LOW (ref 6.5–8.1)

## 2016-03-10 LAB — PROTIME-INR
INR: 1.12
Prothrombin Time: 14.4 seconds (ref 11.4–15.2)

## 2016-03-10 LAB — MAGNESIUM: Magnesium: 2.1 mg/dL (ref 1.7–2.4)

## 2016-03-10 LAB — TSH: TSH: 0.541 u[IU]/mL (ref 0.350–4.500)

## 2016-03-10 LAB — PHOSPHORUS: PHOSPHORUS: 4.3 mg/dL (ref 2.5–4.6)

## 2016-03-11 ENCOUNTER — Other Ambulatory Visit: Payer: Self-pay

## 2016-03-11 LAB — URINE CULTURE: CULTURE: NO GROWTH

## 2016-03-11 LAB — HEMOGLOBIN A1C
Hgb A1c MFr Bld: 7.1 % — ABNORMAL HIGH (ref 4.8–5.6)
Mean Plasma Glucose: 157 mg/dL

## 2016-03-11 NOTE — Patient Outreach (Signed)
[facility]) Care Management  03/11/2016  EMMETTE KROG [birth year] [id number]  Difficult case discussion completed on 03/11/16.  PLAN:  Patient transferred to [facility] on 03/09/16.    Quinn Plowman RN,BSN,CCM [facility] Telephonic  [phone]

## 2016-03-18 ENCOUNTER — Other Ambulatory Visit (HOSPITAL_COMMUNITY): Payer: Medicare Other

## 2016-03-18 DIAGNOSIS — J9690 Respiratory failure, unspecified, unspecified whether with hypoxia or hypercapnia: Secondary | ICD-10-CM | POA: Diagnosis not present

## 2016-03-18 LAB — BASIC METABOLIC PANEL
ANION GAP: 8 (ref 5–15)
BUN: 41 mg/dL — AB (ref 6–20)
CHLORIDE: 92 mmol/L — AB (ref 101–111)
CO2: 38 mmol/L — ABNORMAL HIGH (ref 22–32)
Calcium: 8.4 mg/dL — ABNORMAL LOW (ref 8.9–10.3)
Creatinine, Ser: 0.63 mg/dL (ref 0.61–1.24)
Glucose, Bld: 134 mg/dL — ABNORMAL HIGH (ref 65–99)
POTASSIUM: 3.9 mmol/L (ref 3.5–5.1)
SODIUM: 138 mmol/L (ref 135–145)

## 2016-03-18 LAB — CBC
HCT: 39.1 % (ref 39.0–52.0)
HEMOGLOBIN: 12.4 g/dL — AB (ref 13.0–17.0)
MCH: 28.2 pg (ref 26.0–34.0)
MCHC: 31.7 g/dL (ref 30.0–36.0)
MCV: 88.9 fL (ref 78.0–100.0)
PLATELETS: 136 10*3/uL — AB (ref 150–400)
RBC: 4.4 MIL/uL (ref 4.22–5.81)
RDW: 15.9 % — ABNORMAL HIGH (ref 11.5–15.5)
WBC: 7.7 10*3/uL (ref 4.0–10.5)

## 2016-03-20 LAB — CBC
HEMATOCRIT: 36.9 % — AB (ref 39.0–52.0)
HEMOGLOBIN: 11.8 g/dL — AB (ref 13.0–17.0)
MCH: 28.5 pg (ref 26.0–34.0)
MCHC: 32 g/dL (ref 30.0–36.0)
MCV: 89.1 fL (ref 78.0–100.0)
Platelets: 120 10*3/uL — ABNORMAL LOW (ref 150–400)
RBC: 4.14 MIL/uL — ABNORMAL LOW (ref 4.22–5.81)
RDW: 16.2 % — ABNORMAL HIGH (ref 11.5–15.5)
WBC: 8.9 10*3/uL (ref 4.0–10.5)

## 2016-03-21 LAB — CBC
HCT: 40.7 % (ref 39.0–52.0)
HEMOGLOBIN: 12.9 g/dL — AB (ref 13.0–17.0)
MCH: 28.2 pg (ref 26.0–34.0)
MCHC: 31.7 g/dL (ref 30.0–36.0)
MCV: 88.9 fL (ref 78.0–100.0)
PLATELETS: 112 10*3/uL — AB (ref 150–400)
RBC: 4.58 MIL/uL (ref 4.22–5.81)
RDW: 16.6 % — ABNORMAL HIGH (ref 11.5–15.5)
WBC: 12.4 10*3/uL — ABNORMAL HIGH (ref 4.0–10.5)

## 2016-03-22 ENCOUNTER — Other Ambulatory Visit (HOSPITAL_COMMUNITY): Payer: Medicare Other

## 2016-03-22 DIAGNOSIS — J9600 Acute respiratory failure, unspecified whether with hypoxia or hypercapnia: Secondary | ICD-10-CM | POA: Diagnosis not present

## 2016-03-22 LAB — BLOOD GAS, ARTERIAL
ACID-BASE EXCESS: 11.4 mmol/L — AB (ref 0.0–2.0)
Bicarbonate: 35.2 mmol/L — ABNORMAL HIGH (ref 20.0–28.0)
O2 CONTENT: 5 L/min
O2 SAT: 91.8 %
PCO2 ART: 43.4 mmHg (ref 32.0–48.0)
PO2 ART: 59.8 mmHg — AB (ref 83.0–108.0)
Patient temperature: 98.6
pH, Arterial: 7.52 — ABNORMAL HIGH (ref 7.350–7.450)

## 2016-03-22 LAB — CBC
HCT: 40.2 % (ref 39.0–52.0)
Hemoglobin: 12.7 g/dL — ABNORMAL LOW (ref 13.0–17.0)
MCH: 28.4 pg (ref 26.0–34.0)
MCHC: 31.6 g/dL (ref 30.0–36.0)
MCV: 89.9 fL (ref 78.0–100.0)
PLATELETS: 85 10*3/uL — AB (ref 150–400)
RBC: 4.47 MIL/uL (ref 4.22–5.81)
RDW: 16.6 % — ABNORMAL HIGH (ref 11.5–15.5)
WBC: 6.4 10*3/uL (ref 4.0–10.5)

## 2016-03-23 LAB — BASIC METABOLIC PANEL
ANION GAP: 14 (ref 5–15)
BUN: 41 mg/dL — ABNORMAL HIGH (ref 6–20)
CALCIUM: 8.4 mg/dL — AB (ref 8.9–10.3)
CO2: 31 mmol/L (ref 22–32)
Chloride: 91 mmol/L — ABNORMAL LOW (ref 101–111)
Creatinine, Ser: 0.69 mg/dL (ref 0.61–1.24)
GLUCOSE: 182 mg/dL — AB (ref 65–99)
Potassium: 3.6 mmol/L (ref 3.5–5.1)
SODIUM: 136 mmol/L (ref 135–145)

## 2016-03-23 LAB — CBC
HCT: 39.7 % (ref 39.0–52.0)
Hemoglobin: 12.7 g/dL — ABNORMAL LOW (ref 13.0–17.0)
MCH: 28.3 pg (ref 26.0–34.0)
MCHC: 32 g/dL (ref 30.0–36.0)
MCV: 88.4 fL (ref 78.0–100.0)
PLATELETS: 64 10*3/uL — AB (ref 150–400)
RBC: 4.49 MIL/uL (ref 4.22–5.81)
RDW: 16.7 % — AB (ref 11.5–15.5)
WBC: 6.9 10*3/uL (ref 4.0–10.5)

## 2016-03-24 LAB — CBC
HEMATOCRIT: 39.8 % (ref 39.0–52.0)
HEMOGLOBIN: 12.8 g/dL — AB (ref 13.0–17.0)
MCH: 28.6 pg (ref 26.0–34.0)
MCHC: 32.2 g/dL (ref 30.0–36.0)
MCV: 89 fL (ref 78.0–100.0)
Platelets: 76 10*3/uL — ABNORMAL LOW (ref 150–400)
RBC: 4.47 MIL/uL (ref 4.22–5.81)
RDW: 16.7 % — ABNORMAL HIGH (ref 11.5–15.5)
WBC: 12.4 10*3/uL — AB (ref 4.0–10.5)

## 2016-03-25 LAB — CBC
HEMATOCRIT: 38.4 % — AB (ref 39.0–52.0)
Hemoglobin: 12.3 g/dL — ABNORMAL LOW (ref 13.0–17.0)
MCH: 28.3 pg (ref 26.0–34.0)
MCHC: 32 g/dL (ref 30.0–36.0)
MCV: 88.3 fL (ref 78.0–100.0)
PLATELETS: 64 10*3/uL — AB (ref 150–400)
RBC: 4.35 MIL/uL (ref 4.22–5.81)
RDW: 16.8 % — AB (ref 11.5–15.5)
WBC: 8.9 10*3/uL (ref 4.0–10.5)

## 2016-03-26 LAB — CBC
HEMATOCRIT: 37.2 % — AB (ref 39.0–52.0)
Hemoglobin: 11.9 g/dL — ABNORMAL LOW (ref 13.0–17.0)
MCH: 28.1 pg (ref 26.0–34.0)
MCHC: 32 g/dL (ref 30.0–36.0)
MCV: 87.9 fL (ref 78.0–100.0)
Platelets: 62 10*3/uL — ABNORMAL LOW (ref 150–400)
RBC: 4.23 MIL/uL (ref 4.22–5.81)
RDW: 16.9 % — AB (ref 11.5–15.5)
WBC: 9.4 10*3/uL (ref 4.0–10.5)

## 2016-03-28 ENCOUNTER — Other Ambulatory Visit (HOSPITAL_COMMUNITY): Payer: Medicare Other

## 2016-03-28 DIAGNOSIS — J9690 Respiratory failure, unspecified, unspecified whether with hypoxia or hypercapnia: Secondary | ICD-10-CM | POA: Diagnosis not present

## 2016-03-28 LAB — CBC
HCT: 38 % — ABNORMAL LOW (ref 39.0–52.0)
HEMOGLOBIN: 12.1 g/dL — AB (ref 13.0–17.0)
MCH: 28.6 pg (ref 26.0–34.0)
MCHC: 31.8 g/dL (ref 30.0–36.0)
MCV: 89.8 fL (ref 78.0–100.0)
Platelets: 80 10*3/uL — ABNORMAL LOW (ref 150–400)
RBC: 4.23 MIL/uL (ref 4.22–5.81)
RDW: 17.3 % — ABNORMAL HIGH (ref 11.5–15.5)
WBC: 9.8 10*3/uL (ref 4.0–10.5)

## 2016-03-28 LAB — BASIC METABOLIC PANEL
ANION GAP: 12 (ref 5–15)
BUN: 35 mg/dL — ABNORMAL HIGH (ref 6–20)
CALCIUM: 8.5 mg/dL — AB (ref 8.9–10.3)
CO2: 37 mmol/L — AB (ref 22–32)
CREATININE: 0.8 mg/dL (ref 0.61–1.24)
Chloride: 87 mmol/L — ABNORMAL LOW (ref 101–111)
GFR calc Af Amer: >60 mL/min (ref >60)
GFR calc non Af Amer: >60 mL/min (ref >60)
Glucose, Bld: 87 mg/dL (ref 65–99)
Potassium: 3.1 mmol/L — ABNORMAL LOW (ref 3.5–5.1)
Sodium: 136 mmol/L (ref 135–145)

## 2016-03-29 LAB — CULTURE, RESPIRATORY W GRAM STAIN

## 2016-03-29 NOTE — Consult Note (Signed)
   [facility] CM Inpatient Consult   03/29/2016  Allen Beasley 09-Mar-1924 [id number]    [facility] Care Management follow up. Spoke with Risk analyst. Likely discharge plan is for SNF in [place], [place]. This will be closer to family. Visited [name] at bedside on [facility] and spoke with him briefly. Left contact information at bedside.   Marthenia Rolling, MSN-Ed, RN,BSN [facility] Liaison [phone]

## 2016-03-30 LAB — BASIC METABOLIC PANEL
Anion gap: 14 (ref 5–15)
BUN: 38 mg/dL — ABNORMAL HIGH (ref 6–20)
CHLORIDE: 83 mmol/L — AB (ref 101–111)
CO2: 39 mmol/L — AB (ref 22–32)
Calcium: 8.9 mg/dL (ref 8.9–10.3)
Creatinine, Ser: 0.9 mg/dL (ref 0.61–1.24)
GFR calc non Af Amer: >60 mL/min (ref >60)
Glucose, Bld: 103 mg/dL — ABNORMAL HIGH (ref 65–99)
Potassium: 3 mmol/L — ABNORMAL LOW (ref 3.5–5.1)
Sodium: 136 mmol/L (ref 135–145)

## 2016-03-30 LAB — CBC
HEMATOCRIT: 37.5 % — AB (ref 39.0–52.0)
Hemoglobin: 12.3 g/dL — ABNORMAL LOW (ref 13.0–17.0)
MCH: 28.4 pg (ref 26.0–34.0)
MCHC: 32.8 g/dL (ref 30.0–36.0)
MCV: 86.6 fL (ref 78.0–100.0)
Platelets: 103 10*3/uL — ABNORMAL LOW (ref 150–400)
RBC: 4.33 MIL/uL (ref 4.22–5.81)
RDW: 16.9 % — AB (ref 11.5–15.5)
WBC: 10.1 10*3/uL (ref 4.0–10.5)

## 2016-03-30 LAB — MAGNESIUM: Magnesium: 1.7 mg/dL (ref 1.7–2.4)

## 2016-03-31 LAB — POTASSIUM: Potassium: 3.5 mmol/L (ref 3.5–5.1)

## 2016-04-01 LAB — BLOOD GAS, ARTERIAL
ACID-BASE EXCESS: 15.1 mmol/L — AB (ref 0.0–2.0)
Bicarbonate: 39.5 mmol/L — ABNORMAL HIGH (ref 20.0–28.0)
O2 CONTENT: 5 L/min
O2 SAT: 94.2 %
PCO2 ART: 50.1 mmHg — AB (ref 32.0–48.0)
PO2 ART: 71.2 mmHg — AB (ref 83.0–108.0)
Patient temperature: 98.6
pH, Arterial: 7.508 — ABNORMAL HIGH (ref 7.350–7.450)

## 2016-04-02 DIAGNOSIS — Z66 Do not resuscitate: Secondary | ICD-10-CM | POA: Diagnosis not present

## 2016-04-02 DIAGNOSIS — R0603 Acute respiratory distress: Secondary | ICD-10-CM | POA: Diagnosis not present

## 2016-04-02 DIAGNOSIS — J439 Emphysema, unspecified: Secondary | ICD-10-CM | POA: Diagnosis not present

## 2016-04-02 DIAGNOSIS — I351 Nonrheumatic aortic (valve) insufficiency: Secondary | ICD-10-CM | POA: Diagnosis present

## 2016-04-02 DIAGNOSIS — T45515A Adverse effect of anticoagulants, initial encounter: Secondary | ICD-10-CM | POA: Diagnosis present

## 2016-04-02 DIAGNOSIS — J9690 Respiratory failure, unspecified, unspecified whether with hypoxia or hypercapnia: Secondary | ICD-10-CM | POA: Diagnosis not present

## 2016-04-02 DIAGNOSIS — J850 Gangrene and necrosis of lung: Secondary | ICD-10-CM | POA: Diagnosis not present

## 2016-04-02 DIAGNOSIS — Z9981 Dependence on supplemental oxygen: Secondary | ICD-10-CM | POA: Diagnosis not present

## 2016-04-02 DIAGNOSIS — E870 Hyperosmolality and hypernatremia: Secondary | ICD-10-CM | POA: Diagnosis not present

## 2016-04-02 DIAGNOSIS — Z48815 Encounter for surgical aftercare following surgery on the digestive system: Secondary | ICD-10-CM | POA: Diagnosis not present

## 2016-04-02 DIAGNOSIS — D6959 Other secondary thrombocytopenia: Secondary | ICD-10-CM | POA: Diagnosis present

## 2016-04-02 DIAGNOSIS — J449 Chronic obstructive pulmonary disease, unspecified: Secondary | ICD-10-CM | POA: Diagnosis not present

## 2016-04-02 DIAGNOSIS — K219 Gastro-esophageal reflux disease without esophagitis: Secondary | ICD-10-CM | POA: Diagnosis present

## 2016-04-02 DIAGNOSIS — R1312 Dysphagia, oropharyngeal phase: Secondary | ICD-10-CM | POA: Diagnosis not present

## 2016-04-02 DIAGNOSIS — Z781 Physical restraint status: Secondary | ICD-10-CM | POA: Diagnosis not present

## 2016-04-02 DIAGNOSIS — R4702 Dysphasia: Secondary | ICD-10-CM | POA: Diagnosis present

## 2016-04-02 DIAGNOSIS — Z7951 Long term (current) use of inhaled steroids: Secondary | ICD-10-CM | POA: Diagnosis not present

## 2016-04-02 DIAGNOSIS — I10 Essential (primary) hypertension: Secondary | ICD-10-CM | POA: Diagnosis present

## 2016-04-02 DIAGNOSIS — J852 Abscess of lung without pneumonia: Secondary | ICD-10-CM | POA: Diagnosis not present

## 2016-04-02 DIAGNOSIS — Z7901 Long term (current) use of anticoagulants: Secondary | ICD-10-CM | POA: Diagnosis not present

## 2016-04-02 DIAGNOSIS — J441 Chronic obstructive pulmonary disease with (acute) exacerbation: Secondary | ICD-10-CM | POA: Diagnosis present

## 2016-04-02 DIAGNOSIS — Z880 Allergy status to penicillin: Secondary | ICD-10-CM | POA: Diagnosis not present

## 2016-04-02 DIAGNOSIS — K56699 Other intestinal obstruction unspecified as to partial versus complete obstruction: Secondary | ICD-10-CM | POA: Diagnosis not present

## 2016-04-02 DIAGNOSIS — E876 Hypokalemia: Secondary | ICD-10-CM | POA: Diagnosis not present

## 2016-04-02 DIAGNOSIS — K4090 Unilateral inguinal hernia, without obstruction or gangrene, not specified as recurrent: Secondary | ICD-10-CM | POA: Diagnosis not present

## 2016-04-02 DIAGNOSIS — I493 Ventricular premature depolarization: Secondary | ICD-10-CM | POA: Diagnosis not present

## 2016-04-02 DIAGNOSIS — M6281 Muscle weakness (generalized): Secondary | ICD-10-CM | POA: Diagnosis not present

## 2016-04-02 DIAGNOSIS — T471X5A Adverse effect of other antacids and anti-gastric-secretion drugs, initial encounter: Secondary | ICD-10-CM | POA: Diagnosis present

## 2016-04-02 DIAGNOSIS — J189 Pneumonia, unspecified organism: Secondary | ICD-10-CM | POA: Diagnosis not present

## 2016-04-02 DIAGNOSIS — E785 Hyperlipidemia, unspecified: Secondary | ICD-10-CM | POA: Diagnosis present

## 2016-04-02 DIAGNOSIS — F17210 Nicotine dependence, cigarettes, uncomplicated: Secondary | ICD-10-CM | POA: Diagnosis present

## 2016-04-02 DIAGNOSIS — R262 Difficulty in walking, not elsewhere classified: Secondary | ICD-10-CM | POA: Diagnosis not present

## 2016-04-02 DIAGNOSIS — Z7952 Long term (current) use of systemic steroids: Secondary | ICD-10-CM | POA: Diagnosis not present

## 2016-04-02 DIAGNOSIS — J9601 Acute respiratory failure with hypoxia: Secondary | ICD-10-CM | POA: Diagnosis not present

## 2016-04-02 DIAGNOSIS — R498 Other voice and resonance disorders: Secondary | ICD-10-CM | POA: Diagnosis not present

## 2016-04-02 DIAGNOSIS — B3741 Candidal cystitis and urethritis: Secondary | ICD-10-CM | POA: Diagnosis present

## 2016-04-02 DIAGNOSIS — K56609 Unspecified intestinal obstruction, unspecified as to partial versus complete obstruction: Secondary | ICD-10-CM | POA: Diagnosis not present

## 2016-04-02 DIAGNOSIS — Z79899 Other long term (current) drug therapy: Secondary | ICD-10-CM | POA: Diagnosis not present

## 2016-04-02 DIAGNOSIS — R1310 Dysphagia, unspecified: Secondary | ICD-10-CM | POA: Diagnosis present

## 2016-04-02 DIAGNOSIS — L98499 Non-pressure chronic ulcer of skin of other sites with unspecified severity: Secondary | ICD-10-CM | POA: Diagnosis present

## 2016-04-02 DIAGNOSIS — J690 Pneumonitis due to inhalation of food and vomit: Secondary | ICD-10-CM | POA: Diagnosis not present

## 2016-04-02 DIAGNOSIS — Z881 Allergy status to other antibiotic agents status: Secondary | ICD-10-CM | POA: Diagnosis not present

## 2016-04-08 DIAGNOSIS — J441 Chronic obstructive pulmonary disease with (acute) exacerbation: Secondary | ICD-10-CM | POA: Diagnosis not present

## 2016-04-08 DIAGNOSIS — J449 Chronic obstructive pulmonary disease, unspecified: Secondary | ICD-10-CM | POA: Diagnosis not present

## 2016-04-08 DIAGNOSIS — Z7401 Bed confinement status: Secondary | ICD-10-CM | POA: Diagnosis not present

## 2016-04-08 DIAGNOSIS — F17210 Nicotine dependence, cigarettes, uncomplicated: Secondary | ICD-10-CM | POA: Diagnosis present

## 2016-04-08 DIAGNOSIS — I351 Nonrheumatic aortic (valve) insufficiency: Secondary | ICD-10-CM | POA: Diagnosis not present

## 2016-04-08 DIAGNOSIS — D649 Anemia, unspecified: Secondary | ICD-10-CM | POA: Diagnosis not present

## 2016-04-08 DIAGNOSIS — R1312 Dysphagia, oropharyngeal phase: Secondary | ICD-10-CM | POA: Diagnosis not present

## 2016-04-08 DIAGNOSIS — L98499 Non-pressure chronic ulcer of skin of other sites with unspecified severity: Secondary | ICD-10-CM | POA: Diagnosis present

## 2016-04-08 DIAGNOSIS — K219 Gastro-esophageal reflux disease without esophagitis: Secondary | ICD-10-CM | POA: Diagnosis not present

## 2016-04-08 DIAGNOSIS — E870 Hyperosmolality and hypernatremia: Secondary | ICD-10-CM | POA: Diagnosis not present

## 2016-04-08 DIAGNOSIS — T45515A Adverse effect of anticoagulants, initial encounter: Secondary | ICD-10-CM | POA: Diagnosis present

## 2016-04-08 DIAGNOSIS — Z66 Do not resuscitate: Secondary | ICD-10-CM | POA: Diagnosis not present

## 2016-04-08 DIAGNOSIS — J9811 Atelectasis: Secondary | ICD-10-CM | POA: Diagnosis not present

## 2016-04-08 DIAGNOSIS — B3741 Candidal cystitis and urethritis: Secondary | ICD-10-CM | POA: Diagnosis not present

## 2016-04-08 DIAGNOSIS — I493 Ventricular premature depolarization: Secondary | ICD-10-CM | POA: Diagnosis not present

## 2016-04-08 DIAGNOSIS — Z7901 Long term (current) use of anticoagulants: Secondary | ICD-10-CM | POA: Diagnosis not present

## 2016-04-08 DIAGNOSIS — Z8701 Personal history of pneumonia (recurrent): Secondary | ICD-10-CM | POA: Diagnosis not present

## 2016-04-08 DIAGNOSIS — D6959 Other secondary thrombocytopenia: Secondary | ICD-10-CM | POA: Diagnosis not present

## 2016-04-08 DIAGNOSIS — Z79899 Other long term (current) drug therapy: Secondary | ICD-10-CM | POA: Diagnosis not present

## 2016-04-08 DIAGNOSIS — Z6820 Body mass index (BMI) 20.0-20.9, adult: Secondary | ICD-10-CM | POA: Diagnosis not present

## 2016-04-08 DIAGNOSIS — Z7951 Long term (current) use of inhaled steroids: Secondary | ICD-10-CM | POA: Diagnosis not present

## 2016-04-08 DIAGNOSIS — R0603 Acute respiratory distress: Secondary | ICD-10-CM | POA: Diagnosis not present

## 2016-04-08 DIAGNOSIS — Z87891 Personal history of nicotine dependence: Secondary | ICD-10-CM | POA: Diagnosis not present

## 2016-04-08 DIAGNOSIS — E785 Hyperlipidemia, unspecified: Secondary | ICD-10-CM | POA: Diagnosis present

## 2016-04-08 DIAGNOSIS — Z48815 Encounter for surgical aftercare following surgery on the digestive system: Secondary | ICD-10-CM | POA: Diagnosis not present

## 2016-04-08 DIAGNOSIS — J850 Gangrene and necrosis of lung: Secondary | ICD-10-CM | POA: Diagnosis not present

## 2016-04-08 DIAGNOSIS — Z881 Allergy status to other antibiotic agents status: Secondary | ICD-10-CM | POA: Diagnosis not present

## 2016-04-08 DIAGNOSIS — J90 Pleural effusion, not elsewhere classified: Secondary | ICD-10-CM | POA: Diagnosis not present

## 2016-04-08 DIAGNOSIS — E876 Hypokalemia: Secondary | ICD-10-CM | POA: Diagnosis not present

## 2016-04-08 DIAGNOSIS — J440 Chronic obstructive pulmonary disease with acute lower respiratory infection: Secondary | ICD-10-CM | POA: Diagnosis not present

## 2016-04-08 DIAGNOSIS — J690 Pneumonitis due to inhalation of food and vomit: Secondary | ICD-10-CM | POA: Diagnosis not present

## 2016-04-08 DIAGNOSIS — Z880 Allergy status to penicillin: Secondary | ICD-10-CM | POA: Diagnosis not present

## 2016-04-08 DIAGNOSIS — R5381 Other malaise: Secondary | ICD-10-CM | POA: Diagnosis present

## 2016-04-08 DIAGNOSIS — Z781 Physical restraint status: Secondary | ICD-10-CM | POA: Diagnosis not present

## 2016-04-08 DIAGNOSIS — R4702 Dysphasia: Secondary | ICD-10-CM | POA: Diagnosis present

## 2016-04-08 DIAGNOSIS — L89153 Pressure ulcer of sacral region, stage 3: Secondary | ICD-10-CM | POA: Diagnosis present

## 2016-04-08 DIAGNOSIS — J439 Emphysema, unspecified: Secondary | ICD-10-CM | POA: Diagnosis not present

## 2016-04-08 DIAGNOSIS — J189 Pneumonia, unspecified organism: Secondary | ICD-10-CM | POA: Diagnosis not present

## 2016-04-08 DIAGNOSIS — J9601 Acute respiratory failure with hypoxia: Secondary | ICD-10-CM | POA: Diagnosis not present

## 2016-04-08 DIAGNOSIS — E441 Mild protein-calorie malnutrition: Secondary | ICD-10-CM | POA: Diagnosis present

## 2016-04-08 DIAGNOSIS — R1310 Dysphagia, unspecified: Secondary | ICD-10-CM | POA: Diagnosis not present

## 2016-04-08 DIAGNOSIS — R633 Feeding difficulties: Secondary | ICD-10-CM | POA: Diagnosis not present

## 2016-04-08 DIAGNOSIS — I10 Essential (primary) hypertension: Secondary | ICD-10-CM | POA: Diagnosis not present

## 2016-04-08 DIAGNOSIS — M6281 Muscle weakness (generalized): Secondary | ICD-10-CM | POA: Diagnosis not present

## 2016-04-08 DIAGNOSIS — L89152 Pressure ulcer of sacral region, stage 2: Secondary | ICD-10-CM | POA: Diagnosis not present

## 2016-04-08 DIAGNOSIS — R262 Difficulty in walking, not elsewhere classified: Secondary | ICD-10-CM | POA: Diagnosis not present

## 2016-04-08 DIAGNOSIS — T471X5A Adverse effect of other antacids and anti-gastric-secretion drugs, initial encounter: Secondary | ICD-10-CM | POA: Diagnosis present

## 2016-04-08 DIAGNOSIS — Z7952 Long term (current) use of systemic steroids: Secondary | ICD-10-CM | POA: Diagnosis not present

## 2016-04-17 DIAGNOSIS — Z87891 Personal history of nicotine dependence: Secondary | ICD-10-CM | POA: Diagnosis not present

## 2016-04-17 DIAGNOSIS — R1310 Dysphagia, unspecified: Secondary | ICD-10-CM | POA: Diagnosis not present

## 2016-04-17 DIAGNOSIS — E46 Unspecified protein-calorie malnutrition: Secondary | ICD-10-CM | POA: Diagnosis not present

## 2016-04-17 DIAGNOSIS — Z8701 Personal history of pneumonia (recurrent): Secondary | ICD-10-CM | POA: Diagnosis not present

## 2016-04-17 DIAGNOSIS — Z931 Gastrostomy status: Secondary | ICD-10-CM | POA: Diagnosis not present

## 2016-04-17 DIAGNOSIS — R2681 Unsteadiness on feet: Secondary | ICD-10-CM | POA: Diagnosis not present

## 2016-04-17 DIAGNOSIS — K4090 Unilateral inguinal hernia, without obstruction or gangrene, not specified as recurrent: Secondary | ICD-10-CM | POA: Diagnosis not present

## 2016-04-17 DIAGNOSIS — R5381 Other malaise: Secondary | ICD-10-CM | POA: Diagnosis present

## 2016-04-17 DIAGNOSIS — Z66 Do not resuscitate: Secondary | ICD-10-CM | POA: Diagnosis present

## 2016-04-17 DIAGNOSIS — R262 Difficulty in walking, not elsewhere classified: Secondary | ICD-10-CM | POA: Diagnosis not present

## 2016-04-17 DIAGNOSIS — R1312 Dysphagia, oropharyngeal phase: Secondary | ICD-10-CM | POA: Diagnosis present

## 2016-04-17 DIAGNOSIS — J439 Emphysema, unspecified: Secondary | ICD-10-CM | POA: Diagnosis present

## 2016-04-17 DIAGNOSIS — J851 Abscess of lung with pneumonia: Secondary | ICD-10-CM | POA: Diagnosis not present

## 2016-04-17 DIAGNOSIS — R0600 Dyspnea, unspecified: Secondary | ICD-10-CM | POA: Diagnosis not present

## 2016-04-17 DIAGNOSIS — Z48815 Encounter for surgical aftercare following surgery on the digestive system: Secondary | ICD-10-CM | POA: Diagnosis not present

## 2016-04-17 DIAGNOSIS — Z880 Allergy status to penicillin: Secondary | ICD-10-CM | POA: Diagnosis not present

## 2016-04-17 DIAGNOSIS — Z79899 Other long term (current) drug therapy: Secondary | ICD-10-CM | POA: Diagnosis not present

## 2016-04-17 DIAGNOSIS — Z7401 Bed confinement status: Secondary | ICD-10-CM | POA: Diagnosis not present

## 2016-04-17 DIAGNOSIS — J850 Gangrene and necrosis of lung: Secondary | ICD-10-CM | POA: Diagnosis not present

## 2016-04-17 DIAGNOSIS — R531 Weakness: Secondary | ICD-10-CM | POA: Diagnosis not present

## 2016-04-17 DIAGNOSIS — I350 Nonrheumatic aortic (valve) stenosis: Secondary | ICD-10-CM | POA: Diagnosis not present

## 2016-04-17 DIAGNOSIS — Z7409 Other reduced mobility: Secondary | ICD-10-CM | POA: Diagnosis not present

## 2016-04-17 DIAGNOSIS — M6281 Muscle weakness (generalized): Secondary | ICD-10-CM | POA: Diagnosis not present

## 2016-04-17 DIAGNOSIS — K56699 Other intestinal obstruction unspecified as to partial versus complete obstruction: Secondary | ICD-10-CM | POA: Diagnosis not present

## 2016-04-17 DIAGNOSIS — J690 Pneumonitis due to inhalation of food and vomit: Secondary | ICD-10-CM | POA: Diagnosis not present

## 2016-04-17 DIAGNOSIS — E441 Mild protein-calorie malnutrition: Secondary | ICD-10-CM | POA: Diagnosis present

## 2016-04-17 DIAGNOSIS — L8992 Pressure ulcer of unspecified site, stage 2: Secondary | ICD-10-CM | POA: Diagnosis not present

## 2016-04-17 DIAGNOSIS — Z6820 Body mass index (BMI) 20.0-20.9, adult: Secondary | ICD-10-CM | POA: Diagnosis not present

## 2016-04-17 DIAGNOSIS — L89153 Pressure ulcer of sacral region, stage 3: Secondary | ICD-10-CM | POA: Diagnosis present

## 2016-04-17 DIAGNOSIS — I351 Nonrheumatic aortic (valve) insufficiency: Secondary | ICD-10-CM | POA: Diagnosis present

## 2016-04-17 DIAGNOSIS — J449 Chronic obstructive pulmonary disease, unspecified: Secondary | ICD-10-CM | POA: Diagnosis not present

## 2016-04-17 DIAGNOSIS — J441 Chronic obstructive pulmonary disease with (acute) exacerbation: Secondary | ICD-10-CM | POA: Diagnosis not present

## 2016-04-23 DIAGNOSIS — R262 Difficulty in walking, not elsewhere classified: Secondary | ICD-10-CM | POA: Diagnosis not present

## 2016-04-23 DIAGNOSIS — Z48815 Encounter for surgical aftercare following surgery on the digestive system: Secondary | ICD-10-CM | POA: Diagnosis not present

## 2016-04-23 DIAGNOSIS — R531 Weakness: Secondary | ICD-10-CM | POA: Diagnosis not present

## 2016-04-23 DIAGNOSIS — Z931 Gastrostomy status: Secondary | ICD-10-CM | POA: Diagnosis not present

## 2016-04-23 DIAGNOSIS — R0902 Hypoxemia: Secondary | ICD-10-CM | POA: Diagnosis not present

## 2016-04-23 DIAGNOSIS — R069 Unspecified abnormalities of breathing: Secondary | ICD-10-CM | POA: Diagnosis not present

## 2016-04-23 DIAGNOSIS — M6281 Muscle weakness (generalized): Secondary | ICD-10-CM | POA: Diagnosis not present

## 2016-04-23 DIAGNOSIS — K4090 Unilateral inguinal hernia, without obstruction or gangrene, not specified as recurrent: Secondary | ICD-10-CM | POA: Diagnosis not present

## 2016-04-23 DIAGNOSIS — R1312 Dysphagia, oropharyngeal phase: Secondary | ICD-10-CM | POA: Diagnosis not present

## 2016-04-23 DIAGNOSIS — R2681 Unsteadiness on feet: Secondary | ICD-10-CM | POA: Diagnosis not present

## 2016-04-23 DIAGNOSIS — L89153 Pressure ulcer of sacral region, stage 3: Secondary | ICD-10-CM | POA: Diagnosis not present

## 2016-04-23 DIAGNOSIS — I509 Heart failure, unspecified: Secondary | ICD-10-CM | POA: Diagnosis not present

## 2016-04-23 DIAGNOSIS — J441 Chronic obstructive pulmonary disease with (acute) exacerbation: Secondary | ICD-10-CM | POA: Diagnosis not present

## 2016-04-23 DIAGNOSIS — R0989 Other specified symptoms and signs involving the circulatory and respiratory systems: Secondary | ICD-10-CM | POA: Diagnosis not present

## 2016-04-23 DIAGNOSIS — J189 Pneumonia, unspecified organism: Secondary | ICD-10-CM | POA: Diagnosis not present

## 2016-04-23 DIAGNOSIS — I350 Nonrheumatic aortic (valve) stenosis: Secondary | ICD-10-CM | POA: Diagnosis not present

## 2016-04-23 DIAGNOSIS — J851 Abscess of lung with pneumonia: Secondary | ICD-10-CM | POA: Diagnosis not present

## 2016-04-23 DIAGNOSIS — J690 Pneumonitis due to inhalation of food and vomit: Secondary | ICD-10-CM | POA: Diagnosis not present

## 2016-04-23 DIAGNOSIS — J850 Gangrene and necrosis of lung: Secondary | ICD-10-CM | POA: Diagnosis not present

## 2016-04-23 DIAGNOSIS — Z7409 Other reduced mobility: Secondary | ICD-10-CM | POA: Diagnosis not present

## 2016-04-23 DIAGNOSIS — K56699 Other intestinal obstruction unspecified as to partial versus complete obstruction: Secondary | ICD-10-CM | POA: Diagnosis not present

## 2016-04-23 DIAGNOSIS — J449 Chronic obstructive pulmonary disease, unspecified: Secondary | ICD-10-CM | POA: Diagnosis not present

## 2016-04-28 DIAGNOSIS — I509 Heart failure, unspecified: Secondary | ICD-10-CM | POA: Diagnosis not present

## 2016-04-28 DIAGNOSIS — J449 Chronic obstructive pulmonary disease, unspecified: Secondary | ICD-10-CM | POA: Diagnosis not present

## 2016-04-28 DIAGNOSIS — J189 Pneumonia, unspecified organism: Secondary | ICD-10-CM | POA: Diagnosis not present

## 2016-04-29 DIAGNOSIS — Z8701 Personal history of pneumonia (recurrent): Secondary | ICD-10-CM | POA: Diagnosis not present

## 2016-04-29 DIAGNOSIS — R918 Other nonspecific abnormal finding of lung field: Secondary | ICD-10-CM | POA: Diagnosis not present

## 2016-04-29 DIAGNOSIS — R0602 Shortness of breath: Secondary | ICD-10-CM | POA: Diagnosis not present

## 2016-04-29 DIAGNOSIS — Z794 Long term (current) use of insulin: Secondary | ICD-10-CM | POA: Diagnosis not present

## 2016-04-29 DIAGNOSIS — I5032 Chronic diastolic (congestive) heart failure: Secondary | ICD-10-CM | POA: Diagnosis not present

## 2016-04-29 DIAGNOSIS — Z9981 Dependence on supplemental oxygen: Secondary | ICD-10-CM | POA: Diagnosis not present

## 2016-04-29 DIAGNOSIS — I509 Heart failure, unspecified: Secondary | ICD-10-CM | POA: Diagnosis not present

## 2016-04-29 DIAGNOSIS — R069 Unspecified abnormalities of breathing: Secondary | ICD-10-CM | POA: Diagnosis not present

## 2016-04-29 DIAGNOSIS — D649 Anemia, unspecified: Secondary | ICD-10-CM | POA: Diagnosis not present

## 2016-04-29 DIAGNOSIS — E119 Type 2 diabetes mellitus without complications: Secondary | ICD-10-CM | POA: Diagnosis present

## 2016-04-29 DIAGNOSIS — Z48815 Encounter for surgical aftercare following surgery on the digestive system: Secondary | ICD-10-CM | POA: Diagnosis not present

## 2016-04-29 DIAGNOSIS — Z7951 Long term (current) use of inhaled steroids: Secondary | ICD-10-CM | POA: Diagnosis not present

## 2016-04-29 DIAGNOSIS — N390 Urinary tract infection, site not specified: Secondary | ICD-10-CM | POA: Diagnosis not present

## 2016-04-29 DIAGNOSIS — I5030 Unspecified diastolic (congestive) heart failure: Secondary | ICD-10-CM | POA: Diagnosis not present

## 2016-04-29 DIAGNOSIS — J690 Pneumonitis due to inhalation of food and vomit: Secondary | ICD-10-CM | POA: Diagnosis not present

## 2016-04-29 DIAGNOSIS — K56699 Other intestinal obstruction unspecified as to partial versus complete obstruction: Secondary | ICD-10-CM | POA: Diagnosis not present

## 2016-04-29 DIAGNOSIS — B9620 Unspecified Escherichia coli [E. coli] as the cause of diseases classified elsewhere: Secondary | ICD-10-CM | POA: Diagnosis not present

## 2016-04-29 DIAGNOSIS — Z881 Allergy status to other antibiotic agents status: Secondary | ICD-10-CM | POA: Diagnosis not present

## 2016-04-29 DIAGNOSIS — B3749 Other urogenital candidiasis: Secondary | ICD-10-CM | POA: Diagnosis present

## 2016-04-29 DIAGNOSIS — R498 Other voice and resonance disorders: Secondary | ICD-10-CM | POA: Diagnosis not present

## 2016-04-29 DIAGNOSIS — J440 Chronic obstructive pulmonary disease with acute lower respiratory infection: Secondary | ICD-10-CM | POA: Diagnosis not present

## 2016-04-29 DIAGNOSIS — Z7952 Long term (current) use of systemic steroids: Secondary | ICD-10-CM | POA: Diagnosis not present

## 2016-04-29 DIAGNOSIS — I482 Chronic atrial fibrillation: Secondary | ICD-10-CM | POA: Diagnosis present

## 2016-04-29 DIAGNOSIS — R319 Hematuria, unspecified: Secondary | ICD-10-CM | POA: Diagnosis not present

## 2016-04-29 DIAGNOSIS — J449 Chronic obstructive pulmonary disease, unspecified: Secondary | ICD-10-CM | POA: Diagnosis not present

## 2016-04-29 DIAGNOSIS — I351 Nonrheumatic aortic (valve) insufficiency: Secondary | ICD-10-CM | POA: Diagnosis not present

## 2016-04-29 DIAGNOSIS — Z931 Gastrostomy status: Secondary | ICD-10-CM | POA: Diagnosis not present

## 2016-04-29 DIAGNOSIS — Z79899 Other long term (current) drug therapy: Secondary | ICD-10-CM | POA: Diagnosis not present

## 2016-04-29 DIAGNOSIS — J189 Pneumonia, unspecified organism: Secondary | ICD-10-CM | POA: Diagnosis not present

## 2016-04-29 DIAGNOSIS — L89153 Pressure ulcer of sacral region, stage 3: Secondary | ICD-10-CM | POA: Diagnosis not present

## 2016-04-29 DIAGNOSIS — B9689 Other specified bacterial agents as the cause of diseases classified elsewhere: Secondary | ICD-10-CM | POA: Diagnosis not present

## 2016-04-29 DIAGNOSIS — R1312 Dysphagia, oropharyngeal phase: Secondary | ICD-10-CM | POA: Diagnosis not present

## 2016-04-29 DIAGNOSIS — M6281 Muscle weakness (generalized): Secondary | ICD-10-CM | POA: Diagnosis not present

## 2016-04-29 DIAGNOSIS — J850 Gangrene and necrosis of lung: Secondary | ICD-10-CM | POA: Diagnosis not present

## 2016-04-29 DIAGNOSIS — L89159 Pressure ulcer of sacral region, unspecified stage: Secondary | ICD-10-CM | POA: Diagnosis not present

## 2016-04-29 DIAGNOSIS — R262 Difficulty in walking, not elsewhere classified: Secondary | ICD-10-CM | POA: Diagnosis not present

## 2016-04-29 DIAGNOSIS — K4090 Unilateral inguinal hernia, without obstruction or gangrene, not specified as recurrent: Secondary | ICD-10-CM | POA: Diagnosis not present

## 2016-04-29 DIAGNOSIS — J441 Chronic obstructive pulmonary disease with (acute) exacerbation: Secondary | ICD-10-CM | POA: Diagnosis not present

## 2016-04-29 DIAGNOSIS — R2681 Unsteadiness on feet: Secondary | ICD-10-CM | POA: Diagnosis not present

## 2016-05-04 DIAGNOSIS — J449 Chronic obstructive pulmonary disease, unspecified: Secondary | ICD-10-CM | POA: Diagnosis not present

## 2016-05-04 DIAGNOSIS — J690 Pneumonitis due to inhalation of food and vomit: Secondary | ICD-10-CM | POA: Diagnosis not present

## 2016-05-04 DIAGNOSIS — J851 Abscess of lung with pneumonia: Secondary | ICD-10-CM | POA: Diagnosis not present

## 2016-05-04 DIAGNOSIS — R0902 Hypoxemia: Secondary | ICD-10-CM | POA: Diagnosis not present

## 2016-05-04 DIAGNOSIS — R262 Difficulty in walking, not elsewhere classified: Secondary | ICD-10-CM | POA: Diagnosis not present

## 2016-05-04 DIAGNOSIS — R069 Unspecified abnormalities of breathing: Secondary | ICD-10-CM | POA: Diagnosis not present

## 2016-05-04 DIAGNOSIS — M6281 Muscle weakness (generalized): Secondary | ICD-10-CM | POA: Diagnosis not present

## 2016-05-04 DIAGNOSIS — K4090 Unilateral inguinal hernia, without obstruction or gangrene, not specified as recurrent: Secondary | ICD-10-CM | POA: Diagnosis not present

## 2016-05-04 DIAGNOSIS — Z09 Encounter for follow-up examination after completed treatment for conditions other than malignant neoplasm: Secondary | ICD-10-CM | POA: Diagnosis not present

## 2016-05-04 DIAGNOSIS — R319 Hematuria, unspecified: Secondary | ICD-10-CM | POA: Diagnosis not present

## 2016-05-04 DIAGNOSIS — L89153 Pressure ulcer of sacral region, stage 3: Secondary | ICD-10-CM | POA: Diagnosis not present

## 2016-05-04 DIAGNOSIS — K56699 Other intestinal obstruction unspecified as to partial versus complete obstruction: Secondary | ICD-10-CM | POA: Diagnosis not present

## 2016-05-04 DIAGNOSIS — R2681 Unsteadiness on feet: Secondary | ICD-10-CM | POA: Diagnosis not present

## 2016-05-04 DIAGNOSIS — Z931 Gastrostomy status: Secondary | ICD-10-CM | POA: Diagnosis not present

## 2016-05-04 DIAGNOSIS — I351 Nonrheumatic aortic (valve) insufficiency: Secondary | ICD-10-CM | POA: Diagnosis not present

## 2016-05-04 DIAGNOSIS — D649 Anemia, unspecified: Secondary | ICD-10-CM | POA: Diagnosis not present

## 2016-05-04 DIAGNOSIS — J441 Chronic obstructive pulmonary disease with (acute) exacerbation: Secondary | ICD-10-CM | POA: Diagnosis not present

## 2016-05-04 DIAGNOSIS — R498 Other voice and resonance disorders: Secondary | ICD-10-CM | POA: Diagnosis not present

## 2016-05-04 DIAGNOSIS — Z48815 Encounter for surgical aftercare following surgery on the digestive system: Secondary | ICD-10-CM | POA: Diagnosis not present

## 2016-05-04 DIAGNOSIS — N390 Urinary tract infection, site not specified: Secondary | ICD-10-CM | POA: Diagnosis not present

## 2016-05-04 DIAGNOSIS — I482 Chronic atrial fibrillation: Secondary | ICD-10-CM | POA: Diagnosis not present

## 2016-05-04 DIAGNOSIS — J850 Gangrene and necrosis of lung: Secondary | ICD-10-CM | POA: Diagnosis not present

## 2016-05-04 DIAGNOSIS — Z466 Encounter for fitting and adjustment of urinary device: Secondary | ICD-10-CM | POA: Diagnosis not present

## 2016-05-04 DIAGNOSIS — J440 Chronic obstructive pulmonary disease with acute lower respiratory infection: Secondary | ICD-10-CM | POA: Diagnosis not present

## 2016-05-04 DIAGNOSIS — R1312 Dysphagia, oropharyngeal phase: Secondary | ICD-10-CM | POA: Diagnosis not present

## 2016-05-29 DIAGNOSIS — J449 Chronic obstructive pulmonary disease, unspecified: Secondary | ICD-10-CM | POA: Diagnosis not present

## 2016-05-29 DIAGNOSIS — Z931 Gastrostomy status: Secondary | ICD-10-CM | POA: Diagnosis not present

## 2016-05-29 DIAGNOSIS — Z466 Encounter for fitting and adjustment of urinary device: Secondary | ICD-10-CM | POA: Diagnosis not present

## 2016-05-29 DIAGNOSIS — J690 Pneumonitis due to inhalation of food and vomit: Secondary | ICD-10-CM | POA: Diagnosis not present

## 2016-06-03 DIAGNOSIS — J851 Abscess of lung with pneumonia: Secondary | ICD-10-CM | POA: Diagnosis not present

## 2016-06-03 DIAGNOSIS — L89153 Pressure ulcer of sacral region, stage 3: Secondary | ICD-10-CM | POA: Diagnosis not present

## 2016-06-03 DIAGNOSIS — J690 Pneumonitis due to inhalation of food and vomit: Secondary | ICD-10-CM | POA: Diagnosis not present

## 2016-06-03 DIAGNOSIS — J449 Chronic obstructive pulmonary disease, unspecified: Secondary | ICD-10-CM | POA: Diagnosis not present

## 2016-06-11 DIAGNOSIS — Z931 Gastrostomy status: Secondary | ICD-10-CM | POA: Diagnosis not present

## 2016-06-11 DIAGNOSIS — L89153 Pressure ulcer of sacral region, stage 3: Secondary | ICD-10-CM | POA: Diagnosis not present

## 2016-06-11 DIAGNOSIS — J449 Chronic obstructive pulmonary disease, unspecified: Secondary | ICD-10-CM | POA: Diagnosis not present

## 2016-06-24 DIAGNOSIS — L89153 Pressure ulcer of sacral region, stage 3: Secondary | ICD-10-CM | POA: Diagnosis not present

## 2016-06-28 DIAGNOSIS — Z8701 Personal history of pneumonia (recurrent): Secondary | ICD-10-CM | POA: Diagnosis not present

## 2016-06-28 DIAGNOSIS — E119 Type 2 diabetes mellitus without complications: Secondary | ICD-10-CM | POA: Diagnosis not present

## 2016-06-28 DIAGNOSIS — R262 Difficulty in walking, not elsewhere classified: Secondary | ICD-10-CM | POA: Diagnosis not present

## 2016-06-28 DIAGNOSIS — Z9989 Dependence on other enabling machines and devices: Secondary | ICD-10-CM | POA: Diagnosis not present

## 2016-06-28 DIAGNOSIS — R269 Unspecified abnormalities of gait and mobility: Secondary | ICD-10-CM | POA: Diagnosis not present

## 2016-06-28 DIAGNOSIS — R41841 Cognitive communication deficit: Secondary | ICD-10-CM | POA: Diagnosis not present

## 2016-06-28 DIAGNOSIS — R1312 Dysphagia, oropharyngeal phase: Secondary | ICD-10-CM | POA: Diagnosis not present

## 2016-06-28 DIAGNOSIS — J449 Chronic obstructive pulmonary disease, unspecified: Secondary | ICD-10-CM | POA: Diagnosis not present

## 2016-06-28 DIAGNOSIS — R5381 Other malaise: Secondary | ICD-10-CM | POA: Diagnosis not present

## 2016-06-28 DIAGNOSIS — J189 Pneumonia, unspecified organism: Secondary | ICD-10-CM | POA: Diagnosis not present

## 2016-06-28 DIAGNOSIS — Z960 Presence of urogenital implants: Secondary | ICD-10-CM | POA: Diagnosis not present

## 2016-06-28 DIAGNOSIS — R279 Unspecified lack of coordination: Secondary | ICD-10-CM | POA: Diagnosis not present

## 2016-06-28 DIAGNOSIS — J690 Pneumonitis due to inhalation of food and vomit: Secondary | ICD-10-CM | POA: Diagnosis not present

## 2016-06-28 DIAGNOSIS — R0600 Dyspnea, unspecified: Secondary | ICD-10-CM | POA: Diagnosis not present

## 2016-06-28 DIAGNOSIS — M6281 Muscle weakness (generalized): Secondary | ICD-10-CM | POA: Diagnosis not present

## 2016-06-28 DIAGNOSIS — J441 Chronic obstructive pulmonary disease with (acute) exacerbation: Secondary | ICD-10-CM | POA: Diagnosis not present

## 2016-06-28 DIAGNOSIS — Z9181 History of falling: Secondary | ICD-10-CM | POA: Diagnosis not present

## 2016-06-28 DIAGNOSIS — E782 Mixed hyperlipidemia: Secondary | ICD-10-CM | POA: Diagnosis not present

## 2016-06-28 DIAGNOSIS — J850 Gangrene and necrosis of lung: Secondary | ICD-10-CM | POA: Diagnosis not present

## 2016-06-28 DIAGNOSIS — I4891 Unspecified atrial fibrillation: Secondary | ICD-10-CM | POA: Diagnosis not present

## 2016-07-02 DIAGNOSIS — J189 Pneumonia, unspecified organism: Secondary | ICD-10-CM | POA: Diagnosis not present

## 2016-07-02 DIAGNOSIS — Z960 Presence of urogenital implants: Secondary | ICD-10-CM | POA: Diagnosis not present

## 2016-07-02 DIAGNOSIS — J850 Gangrene and necrosis of lung: Secondary | ICD-10-CM | POA: Diagnosis not present

## 2016-07-02 DIAGNOSIS — R5381 Other malaise: Secondary | ICD-10-CM | POA: Diagnosis not present

## 2016-07-02 DIAGNOSIS — J449 Chronic obstructive pulmonary disease, unspecified: Secondary | ICD-10-CM | POA: Diagnosis not present

## 2016-07-07 DIAGNOSIS — Z9181 History of falling: Secondary | ICD-10-CM | POA: Diagnosis not present

## 2016-07-07 DIAGNOSIS — R269 Unspecified abnormalities of gait and mobility: Secondary | ICD-10-CM | POA: Diagnosis not present

## 2016-07-07 DIAGNOSIS — R279 Unspecified lack of coordination: Secondary | ICD-10-CM | POA: Diagnosis not present

## 2016-07-07 DIAGNOSIS — M6281 Muscle weakness (generalized): Secondary | ICD-10-CM | POA: Diagnosis not present

## 2016-07-08 DIAGNOSIS — J449 Chronic obstructive pulmonary disease, unspecified: Secondary | ICD-10-CM | POA: Diagnosis not present

## 2016-07-08 DIAGNOSIS — E782 Mixed hyperlipidemia: Secondary | ICD-10-CM | POA: Diagnosis not present

## 2016-07-08 DIAGNOSIS — R5381 Other malaise: Secondary | ICD-10-CM | POA: Diagnosis not present

## 2016-07-08 DIAGNOSIS — J189 Pneumonia, unspecified organism: Secondary | ICD-10-CM | POA: Diagnosis not present

## 2016-07-09 DIAGNOSIS — M6281 Muscle weakness (generalized): Secondary | ICD-10-CM | POA: Diagnosis not present

## 2016-07-09 DIAGNOSIS — R279 Unspecified lack of coordination: Secondary | ICD-10-CM | POA: Diagnosis not present

## 2016-07-09 DIAGNOSIS — R269 Unspecified abnormalities of gait and mobility: Secondary | ICD-10-CM | POA: Diagnosis not present

## 2016-07-09 DIAGNOSIS — Z9181 History of falling: Secondary | ICD-10-CM | POA: Diagnosis not present

## 2016-07-11 DIAGNOSIS — I10 Essential (primary) hypertension: Secondary | ICD-10-CM | POA: Diagnosis not present

## 2016-07-11 DIAGNOSIS — Z23 Encounter for immunization: Secondary | ICD-10-CM | POA: Diagnosis not present

## 2016-07-11 DIAGNOSIS — E119 Type 2 diabetes mellitus without complications: Secondary | ICD-10-CM | POA: Diagnosis not present

## 2016-07-11 DIAGNOSIS — Z9989 Dependence on other enabling machines and devices: Secondary | ICD-10-CM | POA: Diagnosis not present

## 2016-07-11 DIAGNOSIS — I4891 Unspecified atrial fibrillation: Secondary | ICD-10-CM | POA: Diagnosis not present

## 2016-07-11 DIAGNOSIS — J9601 Acute respiratory failure with hypoxia: Secondary | ICD-10-CM | POA: Diagnosis not present

## 2016-07-11 DIAGNOSIS — J189 Pneumonia, unspecified organism: Secondary | ICD-10-CM | POA: Diagnosis not present

## 2016-07-11 DIAGNOSIS — J449 Chronic obstructive pulmonary disease, unspecified: Secondary | ICD-10-CM | POA: Diagnosis not present

## 2016-07-11 DIAGNOSIS — R278 Other lack of coordination: Secondary | ICD-10-CM | POA: Diagnosis not present

## 2016-07-11 DIAGNOSIS — J441 Chronic obstructive pulmonary disease with (acute) exacerbation: Secondary | ICD-10-CM | POA: Diagnosis not present

## 2016-07-11 DIAGNOSIS — J9691 Respiratory failure, unspecified with hypoxia: Secondary | ICD-10-CM | POA: Diagnosis not present

## 2016-07-11 DIAGNOSIS — J9600 Acute respiratory failure, unspecified whether with hypoxia or hypercapnia: Secondary | ICD-10-CM | POA: Diagnosis not present

## 2016-07-11 DIAGNOSIS — Z66 Do not resuscitate: Secondary | ICD-10-CM | POA: Diagnosis not present

## 2016-07-11 DIAGNOSIS — R9431 Abnormal electrocardiogram [ECG] [EKG]: Secondary | ICD-10-CM | POA: Diagnosis not present

## 2016-07-11 DIAGNOSIS — T3695XA Adverse effect of unspecified systemic antibiotic, initial encounter: Secondary | ICD-10-CM | POA: Diagnosis not present

## 2016-07-11 DIAGNOSIS — B9562 Methicillin resistant Staphylococcus aureus infection as the cause of diseases classified elsewhere: Secondary | ICD-10-CM | POA: Diagnosis present

## 2016-07-11 DIAGNOSIS — K521 Toxic gastroenteritis and colitis: Secondary | ICD-10-CM | POA: Diagnosis not present

## 2016-07-11 DIAGNOSIS — Z794 Long term (current) use of insulin: Secondary | ICD-10-CM | POA: Diagnosis not present

## 2016-07-11 DIAGNOSIS — D638 Anemia in other chronic diseases classified elsewhere: Secondary | ICD-10-CM | POA: Diagnosis present

## 2016-07-11 DIAGNOSIS — M6281 Muscle weakness (generalized): Secondary | ICD-10-CM | POA: Diagnosis not present

## 2016-07-11 DIAGNOSIS — N390 Urinary tract infection, site not specified: Secondary | ICD-10-CM | POA: Diagnosis not present

## 2016-07-11 DIAGNOSIS — R197 Diarrhea, unspecified: Secondary | ICD-10-CM | POA: Diagnosis not present

## 2016-07-11 DIAGNOSIS — J690 Pneumonitis due to inhalation of food and vomit: Secondary | ICD-10-CM | POA: Diagnosis not present

## 2016-07-11 DIAGNOSIS — R52 Pain, unspecified: Secondary | ICD-10-CM | POA: Diagnosis not present

## 2016-07-11 DIAGNOSIS — I493 Ventricular premature depolarization: Secondary | ICD-10-CM | POA: Diagnosis not present

## 2016-07-11 DIAGNOSIS — J9621 Acute and chronic respiratory failure with hypoxia: Secondary | ICD-10-CM | POA: Diagnosis not present

## 2016-07-11 DIAGNOSIS — A4189 Other specified sepsis: Secondary | ICD-10-CM | POA: Diagnosis not present

## 2016-07-11 DIAGNOSIS — Z515 Encounter for palliative care: Secondary | ICD-10-CM | POA: Diagnosis not present

## 2016-07-11 DIAGNOSIS — I959 Hypotension, unspecified: Secondary | ICD-10-CM | POA: Diagnosis not present

## 2016-07-11 DIAGNOSIS — R918 Other nonspecific abnormal finding of lung field: Secondary | ICD-10-CM | POA: Diagnosis not present

## 2016-07-11 DIAGNOSIS — R0600 Dyspnea, unspecified: Secondary | ICD-10-CM | POA: Diagnosis not present

## 2016-07-11 DIAGNOSIS — R1310 Dysphagia, unspecified: Secondary | ICD-10-CM | POA: Diagnosis not present

## 2016-07-11 DIAGNOSIS — R1312 Dysphagia, oropharyngeal phase: Secondary | ICD-10-CM | POA: Diagnosis not present

## 2016-07-11 DIAGNOSIS — R0602 Shortness of breath: Secondary | ICD-10-CM | POA: Diagnosis not present

## 2016-07-11 DIAGNOSIS — A419 Sepsis, unspecified organism: Secondary | ICD-10-CM | POA: Diagnosis not present

## 2016-07-11 DIAGNOSIS — R000 Tachycardia, unspecified: Secondary | ICD-10-CM | POA: Diagnosis not present

## 2016-07-11 DIAGNOSIS — J90 Pleural effusion, not elsewhere classified: Secondary | ICD-10-CM | POA: Diagnosis present

## 2016-07-11 DIAGNOSIS — Z931 Gastrostomy status: Secondary | ICD-10-CM | POA: Diagnosis not present

## 2016-07-11 DIAGNOSIS — Z87891 Personal history of nicotine dependence: Secondary | ICD-10-CM | POA: Diagnosis not present

## 2016-07-20 DIAGNOSIS — M6281 Muscle weakness (generalized): Secondary | ICD-10-CM | POA: Diagnosis not present

## 2016-07-20 DIAGNOSIS — R404 Transient alteration of awareness: Secondary | ICD-10-CM | POA: Diagnosis not present

## 2016-07-20 DIAGNOSIS — I119 Hypertensive heart disease without heart failure: Secondary | ICD-10-CM | POA: Diagnosis present

## 2016-07-20 DIAGNOSIS — J449 Chronic obstructive pulmonary disease, unspecified: Secondary | ICD-10-CM | POA: Diagnosis not present

## 2016-07-20 DIAGNOSIS — Z881 Allergy status to other antibiotic agents status: Secondary | ICD-10-CM | POA: Diagnosis not present

## 2016-07-20 DIAGNOSIS — R52 Pain, unspecified: Secondary | ICD-10-CM | POA: Diagnosis not present

## 2016-07-20 DIAGNOSIS — R279 Unspecified lack of coordination: Secondary | ICD-10-CM | POA: Diagnosis not present

## 2016-07-20 DIAGNOSIS — N390 Urinary tract infection, site not specified: Secondary | ICD-10-CM | POA: Diagnosis present

## 2016-07-20 DIAGNOSIS — Z87891 Personal history of nicotine dependence: Secondary | ICD-10-CM | POA: Diagnosis not present

## 2016-07-20 DIAGNOSIS — J441 Chronic obstructive pulmonary disease with (acute) exacerbation: Secondary | ICD-10-CM | POA: Diagnosis not present

## 2016-07-20 DIAGNOSIS — Z66 Do not resuscitate: Secondary | ICD-10-CM | POA: Diagnosis not present

## 2016-07-20 DIAGNOSIS — Z7982 Long term (current) use of aspirin: Secondary | ICD-10-CM | POA: Diagnosis not present

## 2016-07-20 DIAGNOSIS — I10 Essential (primary) hypertension: Secondary | ICD-10-CM | POA: Diagnosis not present

## 2016-07-20 DIAGNOSIS — Z781 Physical restraint status: Secondary | ICD-10-CM | POA: Diagnosis not present

## 2016-07-20 DIAGNOSIS — R1310 Dysphagia, unspecified: Secondary | ICD-10-CM | POA: Diagnosis not present

## 2016-07-20 DIAGNOSIS — A4189 Other specified sepsis: Secondary | ICD-10-CM | POA: Diagnosis not present

## 2016-07-20 DIAGNOSIS — I4891 Unspecified atrial fibrillation: Secondary | ICD-10-CM | POA: Diagnosis present

## 2016-07-20 DIAGNOSIS — J189 Pneumonia, unspecified organism: Secondary | ICD-10-CM | POA: Diagnosis not present

## 2016-07-20 DIAGNOSIS — R0600 Dyspnea, unspecified: Secondary | ICD-10-CM | POA: Diagnosis not present

## 2016-07-20 DIAGNOSIS — Z4682 Encounter for fitting and adjustment of non-vascular catheter: Secondary | ICD-10-CM | POA: Diagnosis not present

## 2016-07-20 DIAGNOSIS — Z515 Encounter for palliative care: Secondary | ICD-10-CM | POA: Diagnosis not present

## 2016-07-20 DIAGNOSIS — I2510 Atherosclerotic heart disease of native coronary artery without angina pectoris: Secondary | ICD-10-CM | POA: Diagnosis present

## 2016-07-20 DIAGNOSIS — D649 Anemia, unspecified: Secondary | ICD-10-CM | POA: Diagnosis not present

## 2016-07-20 DIAGNOSIS — Z4659 Encounter for fitting and adjustment of other gastrointestinal appliance and device: Secondary | ICD-10-CM | POA: Diagnosis not present

## 2016-07-20 DIAGNOSIS — R0902 Hypoxemia: Secondary | ICD-10-CM | POA: Diagnosis not present

## 2016-07-20 DIAGNOSIS — Z9181 History of falling: Secondary | ICD-10-CM | POA: Diagnosis not present

## 2016-07-20 DIAGNOSIS — J690 Pneumonitis due to inhalation of food and vomit: Secondary | ICD-10-CM | POA: Diagnosis not present

## 2016-07-20 DIAGNOSIS — R05 Cough: Secondary | ICD-10-CM | POA: Diagnosis not present

## 2016-07-20 DIAGNOSIS — R278 Other lack of coordination: Secondary | ICD-10-CM | POA: Diagnosis not present

## 2016-07-20 DIAGNOSIS — E785 Hyperlipidemia, unspecified: Secondary | ICD-10-CM | POA: Diagnosis present

## 2016-07-20 DIAGNOSIS — Z9989 Dependence on other enabling machines and devices: Secondary | ICD-10-CM | POA: Diagnosis not present

## 2016-07-20 DIAGNOSIS — Z8701 Personal history of pneumonia (recurrent): Secondary | ICD-10-CM | POA: Diagnosis not present

## 2016-07-20 DIAGNOSIS — I351 Nonrheumatic aortic (valve) insufficiency: Secondary | ICD-10-CM | POA: Diagnosis present

## 2016-07-20 DIAGNOSIS — R197 Diarrhea, unspecified: Secondary | ICD-10-CM | POA: Diagnosis not present

## 2016-07-20 DIAGNOSIS — Z743 Need for continuous supervision: Secondary | ICD-10-CM | POA: Diagnosis not present

## 2016-07-20 DIAGNOSIS — J90 Pleural effusion, not elsewhere classified: Secondary | ICD-10-CM | POA: Diagnosis not present

## 2016-07-20 DIAGNOSIS — R1312 Dysphagia, oropharyngeal phase: Secondary | ICD-10-CM | POA: Diagnosis not present

## 2016-07-20 DIAGNOSIS — Z23 Encounter for immunization: Secondary | ICD-10-CM | POA: Diagnosis not present

## 2016-07-20 DIAGNOSIS — J9621 Acute and chronic respiratory failure with hypoxia: Secondary | ICD-10-CM | POA: Diagnosis not present

## 2016-07-20 DIAGNOSIS — J9691 Respiratory failure, unspecified with hypoxia: Secondary | ICD-10-CM | POA: Diagnosis not present

## 2016-07-20 DIAGNOSIS — J9600 Acute respiratory failure, unspecified whether with hypoxia or hypercapnia: Secondary | ICD-10-CM | POA: Diagnosis not present

## 2016-07-20 DIAGNOSIS — Z931 Gastrostomy status: Secondary | ICD-10-CM | POA: Diagnosis not present

## 2016-07-20 DIAGNOSIS — E119 Type 2 diabetes mellitus without complications: Secondary | ICD-10-CM | POA: Diagnosis not present

## 2016-07-20 DIAGNOSIS — J9601 Acute respiratory failure with hypoxia: Secondary | ICD-10-CM | POA: Diagnosis present

## 2016-07-20 DIAGNOSIS — Z79899 Other long term (current) drug therapy: Secondary | ICD-10-CM | POA: Diagnosis not present

## 2016-07-20 DIAGNOSIS — Z794 Long term (current) use of insulin: Secondary | ICD-10-CM | POA: Diagnosis not present

## 2016-07-20 DIAGNOSIS — R531 Weakness: Secondary | ICD-10-CM | POA: Diagnosis not present

## 2016-07-20 DIAGNOSIS — R269 Unspecified abnormalities of gait and mobility: Secondary | ICD-10-CM | POA: Diagnosis not present

## 2016-07-28 DIAGNOSIS — R269 Unspecified abnormalities of gait and mobility: Secondary | ICD-10-CM | POA: Diagnosis not present

## 2016-07-28 DIAGNOSIS — M6281 Muscle weakness (generalized): Secondary | ICD-10-CM | POA: Diagnosis not present

## 2016-07-28 DIAGNOSIS — Z9181 History of falling: Secondary | ICD-10-CM | POA: Diagnosis not present

## 2016-07-28 DIAGNOSIS — R279 Unspecified lack of coordination: Secondary | ICD-10-CM | POA: Diagnosis not present

## 2016-07-30 DIAGNOSIS — R279 Unspecified lack of coordination: Secondary | ICD-10-CM | POA: Diagnosis not present

## 2016-07-30 DIAGNOSIS — Z9181 History of falling: Secondary | ICD-10-CM | POA: Diagnosis not present

## 2016-07-30 DIAGNOSIS — R269 Unspecified abnormalities of gait and mobility: Secondary | ICD-10-CM | POA: Diagnosis not present

## 2016-07-30 DIAGNOSIS — M6281 Muscle weakness (generalized): Secondary | ICD-10-CM | POA: Diagnosis not present

## 2016-08-04 DIAGNOSIS — R269 Unspecified abnormalities of gait and mobility: Secondary | ICD-10-CM | POA: Diagnosis not present

## 2016-08-04 DIAGNOSIS — R279 Unspecified lack of coordination: Secondary | ICD-10-CM | POA: Diagnosis not present

## 2016-08-04 DIAGNOSIS — M6281 Muscle weakness (generalized): Secondary | ICD-10-CM | POA: Diagnosis not present

## 2016-08-04 DIAGNOSIS — Z9181 History of falling: Secondary | ICD-10-CM | POA: Diagnosis not present

## 2016-08-06 DIAGNOSIS — Z9181 History of falling: Secondary | ICD-10-CM | POA: Diagnosis not present

## 2016-08-06 DIAGNOSIS — R279 Unspecified lack of coordination: Secondary | ICD-10-CM | POA: Diagnosis not present

## 2016-08-06 DIAGNOSIS — R269 Unspecified abnormalities of gait and mobility: Secondary | ICD-10-CM | POA: Diagnosis not present

## 2016-08-06 DIAGNOSIS — M6281 Muscle weakness (generalized): Secondary | ICD-10-CM | POA: Diagnosis not present

## 2016-08-11 DIAGNOSIS — R269 Unspecified abnormalities of gait and mobility: Secondary | ICD-10-CM | POA: Diagnosis not present

## 2016-08-11 DIAGNOSIS — Z9181 History of falling: Secondary | ICD-10-CM | POA: Diagnosis not present

## 2016-08-11 DIAGNOSIS — M6281 Muscle weakness (generalized): Secondary | ICD-10-CM | POA: Diagnosis not present

## 2016-08-11 DIAGNOSIS — R279 Unspecified lack of coordination: Secondary | ICD-10-CM | POA: Diagnosis not present

## 2016-08-13 DIAGNOSIS — Z9181 History of falling: Secondary | ICD-10-CM | POA: Diagnosis not present

## 2016-08-13 DIAGNOSIS — R269 Unspecified abnormalities of gait and mobility: Secondary | ICD-10-CM | POA: Diagnosis not present

## 2016-08-13 DIAGNOSIS — R279 Unspecified lack of coordination: Secondary | ICD-10-CM | POA: Diagnosis not present

## 2016-08-13 DIAGNOSIS — M6281 Muscle weakness (generalized): Secondary | ICD-10-CM | POA: Diagnosis not present

## 2016-08-17 DIAGNOSIS — Z931 Gastrostomy status: Secondary | ICD-10-CM | POA: Diagnosis not present

## 2016-08-17 DIAGNOSIS — Z8701 Personal history of pneumonia (recurrent): Secondary | ICD-10-CM | POA: Diagnosis not present

## 2016-08-17 DIAGNOSIS — I4891 Unspecified atrial fibrillation: Secondary | ICD-10-CM | POA: Diagnosis present

## 2016-08-17 DIAGNOSIS — N390 Urinary tract infection, site not specified: Secondary | ICD-10-CM | POA: Diagnosis not present

## 2016-08-17 DIAGNOSIS — Z66 Do not resuscitate: Secondary | ICD-10-CM | POA: Diagnosis present

## 2016-08-17 DIAGNOSIS — Z79899 Other long term (current) drug therapy: Secondary | ICD-10-CM | POA: Diagnosis not present

## 2016-08-17 DIAGNOSIS — J9600 Acute respiratory failure, unspecified whether with hypoxia or hypercapnia: Secondary | ICD-10-CM | POA: Diagnosis not present

## 2016-08-17 DIAGNOSIS — Z9981 Dependence on supplemental oxygen: Secondary | ICD-10-CM | POA: Diagnosis not present

## 2016-08-17 DIAGNOSIS — I351 Nonrheumatic aortic (valve) insufficiency: Secondary | ICD-10-CM | POA: Diagnosis present

## 2016-08-17 DIAGNOSIS — I10 Essential (primary) hypertension: Secondary | ICD-10-CM | POA: Diagnosis not present

## 2016-08-17 DIAGNOSIS — Z743 Need for continuous supervision: Secondary | ICD-10-CM | POA: Diagnosis not present

## 2016-08-17 DIAGNOSIS — Z4682 Encounter for fitting and adjustment of non-vascular catheter: Secondary | ICD-10-CM | POA: Diagnosis not present

## 2016-08-17 DIAGNOSIS — R1312 Dysphagia, oropharyngeal phase: Secondary | ICD-10-CM | POA: Diagnosis not present

## 2016-08-17 DIAGNOSIS — D649 Anemia, unspecified: Secondary | ICD-10-CM | POA: Diagnosis not present

## 2016-08-17 DIAGNOSIS — I119 Hypertensive heart disease without heart failure: Secondary | ICD-10-CM | POA: Diagnosis present

## 2016-08-17 DIAGNOSIS — I2510 Atherosclerotic heart disease of native coronary artery without angina pectoris: Secondary | ICD-10-CM | POA: Diagnosis present

## 2016-08-17 DIAGNOSIS — E119 Type 2 diabetes mellitus without complications: Secondary | ICD-10-CM | POA: Diagnosis not present

## 2016-08-17 DIAGNOSIS — J690 Pneumonitis due to inhalation of food and vomit: Secondary | ICD-10-CM | POA: Diagnosis not present

## 2016-08-17 DIAGNOSIS — Z794 Long term (current) use of insulin: Secondary | ICD-10-CM | POA: Diagnosis not present

## 2016-08-17 DIAGNOSIS — Z7982 Long term (current) use of aspirin: Secondary | ICD-10-CM | POA: Diagnosis not present

## 2016-08-17 DIAGNOSIS — Z4659 Encounter for fitting and adjustment of other gastrointestinal appliance and device: Secondary | ICD-10-CM | POA: Diagnosis not present

## 2016-08-17 DIAGNOSIS — Z781 Physical restraint status: Secondary | ICD-10-CM | POA: Diagnosis not present

## 2016-08-17 DIAGNOSIS — J449 Chronic obstructive pulmonary disease, unspecified: Secondary | ICD-10-CM | POA: Diagnosis not present

## 2016-08-17 DIAGNOSIS — B999 Unspecified infectious disease: Secondary | ICD-10-CM | POA: Diagnosis not present

## 2016-08-17 DIAGNOSIS — J441 Chronic obstructive pulmonary disease with (acute) exacerbation: Secondary | ICD-10-CM | POA: Diagnosis not present

## 2016-08-17 DIAGNOSIS — Z881 Allergy status to other antibiotic agents status: Secondary | ICD-10-CM | POA: Diagnosis not present

## 2016-08-17 DIAGNOSIS — R0602 Shortness of breath: Secondary | ICD-10-CM | POA: Diagnosis not present

## 2016-08-17 DIAGNOSIS — E785 Hyperlipidemia, unspecified: Secondary | ICD-10-CM | POA: Diagnosis present

## 2016-08-17 DIAGNOSIS — J9621 Acute and chronic respiratory failure with hypoxia: Secondary | ICD-10-CM | POA: Diagnosis not present

## 2016-08-17 DIAGNOSIS — Z87891 Personal history of nicotine dependence: Secondary | ICD-10-CM | POA: Diagnosis not present

## 2016-08-17 DIAGNOSIS — J9601 Acute respiratory failure with hypoxia: Secondary | ICD-10-CM | POA: Diagnosis present

## 2016-08-25 DIAGNOSIS — L98419 Non-pressure chronic ulcer of buttock with unspecified severity: Secondary | ICD-10-CM | POA: Diagnosis not present

## 2016-08-26 DIAGNOSIS — E1169 Type 2 diabetes mellitus with other specified complication: Secondary | ICD-10-CM | POA: Diagnosis not present

## 2016-08-26 DIAGNOSIS — R1310 Dysphagia, unspecified: Secondary | ICD-10-CM | POA: Diagnosis not present

## 2016-08-26 DIAGNOSIS — J9601 Acute respiratory failure with hypoxia: Secondary | ICD-10-CM | POA: Diagnosis not present

## 2016-08-26 DIAGNOSIS — J449 Chronic obstructive pulmonary disease, unspecified: Secondary | ICD-10-CM | POA: Diagnosis not present

## 2016-08-26 DIAGNOSIS — E1122 Type 2 diabetes mellitus with diabetic chronic kidney disease: Secondary | ICD-10-CM | POA: Diagnosis not present

## 2016-08-26 DIAGNOSIS — J690 Pneumonitis due to inhalation of food and vomit: Secondary | ICD-10-CM | POA: Diagnosis not present

## 2016-08-26 DIAGNOSIS — R52 Pain, unspecified: Secondary | ICD-10-CM | POA: Diagnosis not present

## 2016-08-29 DIAGNOSIS — R000 Tachycardia, unspecified: Secondary | ICD-10-CM | POA: Diagnosis not present

## 2016-08-29 DIAGNOSIS — R0602 Shortness of breath: Secondary | ICD-10-CM | POA: Diagnosis not present

## 2016-08-29 DIAGNOSIS — R0600 Dyspnea, unspecified: Secondary | ICD-10-CM | POA: Diagnosis not present

## 2016-08-29 DIAGNOSIS — L89152 Pressure ulcer of sacral region, stage 2: Secondary | ICD-10-CM | POA: Diagnosis present

## 2016-08-29 DIAGNOSIS — A4150 Gram-negative sepsis, unspecified: Secondary | ICD-10-CM | POA: Diagnosis not present

## 2016-08-29 DIAGNOSIS — K449 Diaphragmatic hernia without obstruction or gangrene: Secondary | ICD-10-CM | POA: Diagnosis present

## 2016-08-29 DIAGNOSIS — B3741 Candidal cystitis and urethritis: Secondary | ICD-10-CM | POA: Diagnosis not present

## 2016-08-29 DIAGNOSIS — R1310 Dysphagia, unspecified: Secondary | ICD-10-CM | POA: Diagnosis present

## 2016-08-29 DIAGNOSIS — J9620 Acute and chronic respiratory failure, unspecified whether with hypoxia or hypercapnia: Secondary | ICD-10-CM | POA: Diagnosis not present

## 2016-08-29 DIAGNOSIS — K259 Gastric ulcer, unspecified as acute or chronic, without hemorrhage or perforation: Secondary | ICD-10-CM | POA: Diagnosis present

## 2016-08-29 DIAGNOSIS — R918 Other nonspecific abnormal finding of lung field: Secondary | ICD-10-CM | POA: Diagnosis not present

## 2016-08-29 DIAGNOSIS — Z7401 Bed confinement status: Secondary | ICD-10-CM | POA: Diagnosis not present

## 2016-08-29 DIAGNOSIS — E441 Mild protein-calorie malnutrition: Secondary | ICD-10-CM | POA: Diagnosis not present

## 2016-08-29 DIAGNOSIS — J90 Pleural effusion, not elsewhere classified: Secondary | ICD-10-CM | POA: Diagnosis present

## 2016-08-29 DIAGNOSIS — E440 Moderate protein-calorie malnutrition: Secondary | ICD-10-CM | POA: Diagnosis not present

## 2016-08-29 DIAGNOSIS — Z48813 Encounter for surgical aftercare following surgery on the respiratory system: Secondary | ICD-10-CM | POA: Diagnosis not present

## 2016-08-29 DIAGNOSIS — Z743 Need for continuous supervision: Secondary | ICD-10-CM | POA: Diagnosis not present

## 2016-08-29 DIAGNOSIS — J918 Pleural effusion in other conditions classified elsewhere: Secondary | ICD-10-CM | POA: Diagnosis not present

## 2016-08-29 DIAGNOSIS — D464 Refractory anemia, unspecified: Secondary | ICD-10-CM | POA: Diagnosis not present

## 2016-08-29 DIAGNOSIS — A419 Sepsis, unspecified organism: Secondary | ICD-10-CM | POA: Diagnosis not present

## 2016-08-29 DIAGNOSIS — J9600 Acute respiratory failure, unspecified whether with hypoxia or hypercapnia: Secondary | ICD-10-CM | POA: Diagnosis not present

## 2016-08-29 DIAGNOSIS — L89302 Pressure ulcer of unspecified buttock, stage 2: Secondary | ICD-10-CM | POA: Diagnosis not present

## 2016-08-29 DIAGNOSIS — R404 Transient alteration of awareness: Secondary | ICD-10-CM | POA: Diagnosis not present

## 2016-08-29 DIAGNOSIS — J159 Unspecified bacterial pneumonia: Secondary | ICD-10-CM | POA: Diagnosis not present

## 2016-08-29 DIAGNOSIS — Z6820 Body mass index (BMI) 20.0-20.9, adult: Secondary | ICD-10-CM | POA: Diagnosis not present

## 2016-08-29 DIAGNOSIS — J690 Pneumonitis due to inhalation of food and vomit: Secondary | ICD-10-CM | POA: Diagnosis present

## 2016-08-29 DIAGNOSIS — Z9981 Dependence on supplemental oxygen: Secondary | ICD-10-CM | POA: Diagnosis not present

## 2016-08-29 DIAGNOSIS — K922 Gastrointestinal hemorrhage, unspecified: Secondary | ICD-10-CM | POA: Diagnosis not present

## 2016-08-29 DIAGNOSIS — D649 Anemia, unspecified: Secondary | ICD-10-CM | POA: Diagnosis not present

## 2016-08-29 DIAGNOSIS — Z87891 Personal history of nicotine dependence: Secondary | ICD-10-CM | POA: Diagnosis not present

## 2016-08-29 DIAGNOSIS — D72829 Elevated white blood cell count, unspecified: Secondary | ICD-10-CM | POA: Diagnosis not present

## 2016-08-29 DIAGNOSIS — N390 Urinary tract infection, site not specified: Secondary | ICD-10-CM | POA: Diagnosis not present

## 2016-08-29 DIAGNOSIS — R1110 Vomiting, unspecified: Secondary | ICD-10-CM | POA: Diagnosis not present

## 2016-08-29 DIAGNOSIS — B3749 Other urogenital candidiasis: Secondary | ICD-10-CM | POA: Diagnosis not present

## 2016-08-29 DIAGNOSIS — E1165 Type 2 diabetes mellitus with hyperglycemia: Secondary | ICD-10-CM | POA: Diagnosis not present

## 2016-08-29 DIAGNOSIS — D638 Anemia in other chronic diseases classified elsewhere: Secondary | ICD-10-CM | POA: Diagnosis present

## 2016-08-29 DIAGNOSIS — J449 Chronic obstructive pulmonary disease, unspecified: Secondary | ICD-10-CM | POA: Diagnosis present

## 2016-08-29 DIAGNOSIS — E119 Type 2 diabetes mellitus without complications: Secondary | ICD-10-CM | POA: Diagnosis present

## 2016-08-29 DIAGNOSIS — R509 Fever, unspecified: Secondary | ICD-10-CM | POA: Diagnosis not present

## 2016-08-29 DIAGNOSIS — J189 Pneumonia, unspecified organism: Secondary | ICD-10-CM | POA: Diagnosis not present

## 2016-08-29 DIAGNOSIS — J441 Chronic obstructive pulmonary disease with (acute) exacerbation: Secondary | ICD-10-CM | POA: Diagnosis not present

## 2016-09-04 DIAGNOSIS — E119 Type 2 diabetes mellitus without complications: Secondary | ICD-10-CM | POA: Diagnosis not present

## 2016-09-04 DIAGNOSIS — J449 Chronic obstructive pulmonary disease, unspecified: Secondary | ICD-10-CM | POA: Diagnosis not present

## 2016-09-04 DIAGNOSIS — D649 Anemia, unspecified: Secondary | ICD-10-CM | POA: Diagnosis not present

## 2016-09-04 DIAGNOSIS — K922 Gastrointestinal hemorrhage, unspecified: Secondary | ICD-10-CM | POA: Diagnosis not present

## 2016-09-11 DIAGNOSIS — D649 Anemia, unspecified: Secondary | ICD-10-CM | POA: Diagnosis not present

## 2016-09-11 DIAGNOSIS — R262 Difficulty in walking, not elsewhere classified: Secondary | ICD-10-CM | POA: Diagnosis not present

## 2016-09-11 DIAGNOSIS — R1312 Dysphagia, oropharyngeal phase: Secondary | ICD-10-CM | POA: Diagnosis not present

## 2016-09-11 DIAGNOSIS — E119 Type 2 diabetes mellitus without complications: Secondary | ICD-10-CM | POA: Diagnosis not present

## 2016-09-11 DIAGNOSIS — J189 Pneumonia, unspecified organism: Secondary | ICD-10-CM | POA: Diagnosis not present

## 2016-09-11 DIAGNOSIS — M6281 Muscle weakness (generalized): Secondary | ICD-10-CM | POA: Diagnosis not present

## 2016-09-11 DIAGNOSIS — R488 Other symbolic dysfunctions: Secondary | ICD-10-CM | POA: Diagnosis not present

## 2016-09-13 DIAGNOSIS — R262 Difficulty in walking, not elsewhere classified: Secondary | ICD-10-CM | POA: Diagnosis not present

## 2016-09-13 DIAGNOSIS — M6281 Muscle weakness (generalized): Secondary | ICD-10-CM | POA: Diagnosis not present

## 2016-09-13 DIAGNOSIS — R1312 Dysphagia, oropharyngeal phase: Secondary | ICD-10-CM | POA: Diagnosis not present

## 2016-09-13 DIAGNOSIS — J189 Pneumonia, unspecified organism: Secondary | ICD-10-CM | POA: Diagnosis not present

## 2016-09-13 DIAGNOSIS — R488 Other symbolic dysfunctions: Secondary | ICD-10-CM | POA: Diagnosis not present

## 2016-09-14 DIAGNOSIS — R262 Difficulty in walking, not elsewhere classified: Secondary | ICD-10-CM | POA: Diagnosis not present

## 2016-09-14 DIAGNOSIS — J189 Pneumonia, unspecified organism: Secondary | ICD-10-CM | POA: Diagnosis not present

## 2016-09-14 DIAGNOSIS — R1312 Dysphagia, oropharyngeal phase: Secondary | ICD-10-CM | POA: Diagnosis not present

## 2016-09-14 DIAGNOSIS — R488 Other symbolic dysfunctions: Secondary | ICD-10-CM | POA: Diagnosis not present

## 2016-09-14 DIAGNOSIS — M6281 Muscle weakness (generalized): Secondary | ICD-10-CM | POA: Diagnosis not present

## 2016-09-15 DIAGNOSIS — R1312 Dysphagia, oropharyngeal phase: Secondary | ICD-10-CM | POA: Diagnosis not present

## 2016-09-15 DIAGNOSIS — R279 Unspecified lack of coordination: Secondary | ICD-10-CM | POA: Diagnosis not present

## 2016-09-15 DIAGNOSIS — M6281 Muscle weakness (generalized): Secondary | ICD-10-CM | POA: Diagnosis not present

## 2016-09-15 DIAGNOSIS — R488 Other symbolic dysfunctions: Secondary | ICD-10-CM | POA: Diagnosis not present

## 2016-09-15 DIAGNOSIS — Z9181 History of falling: Secondary | ICD-10-CM | POA: Diagnosis not present

## 2016-09-15 DIAGNOSIS — R269 Unspecified abnormalities of gait and mobility: Secondary | ICD-10-CM | POA: Diagnosis not present

## 2016-09-15 DIAGNOSIS — R262 Difficulty in walking, not elsewhere classified: Secondary | ICD-10-CM | POA: Diagnosis not present

## 2016-09-15 DIAGNOSIS — J189 Pneumonia, unspecified organism: Secondary | ICD-10-CM | POA: Diagnosis not present

## 2016-09-16 DIAGNOSIS — J189 Pneumonia, unspecified organism: Secondary | ICD-10-CM | POA: Diagnosis not present

## 2016-09-16 DIAGNOSIS — R262 Difficulty in walking, not elsewhere classified: Secondary | ICD-10-CM | POA: Diagnosis not present

## 2016-09-16 DIAGNOSIS — R1312 Dysphagia, oropharyngeal phase: Secondary | ICD-10-CM | POA: Diagnosis not present

## 2016-09-16 DIAGNOSIS — R488 Other symbolic dysfunctions: Secondary | ICD-10-CM | POA: Diagnosis not present

## 2016-09-16 DIAGNOSIS — M6281 Muscle weakness (generalized): Secondary | ICD-10-CM | POA: Diagnosis not present

## 2016-09-17 DIAGNOSIS — R1312 Dysphagia, oropharyngeal phase: Secondary | ICD-10-CM | POA: Diagnosis not present

## 2016-09-17 DIAGNOSIS — J189 Pneumonia, unspecified organism: Secondary | ICD-10-CM | POA: Diagnosis not present

## 2016-09-17 DIAGNOSIS — M6281 Muscle weakness (generalized): Secondary | ICD-10-CM | POA: Diagnosis not present

## 2016-09-17 DIAGNOSIS — R262 Difficulty in walking, not elsewhere classified: Secondary | ICD-10-CM | POA: Diagnosis not present

## 2016-09-17 DIAGNOSIS — R488 Other symbolic dysfunctions: Secondary | ICD-10-CM | POA: Diagnosis not present

## 2016-09-18 DIAGNOSIS — M6281 Muscle weakness (generalized): Secondary | ICD-10-CM | POA: Diagnosis not present

## 2016-09-18 DIAGNOSIS — J189 Pneumonia, unspecified organism: Secondary | ICD-10-CM | POA: Diagnosis not present

## 2016-09-18 DIAGNOSIS — R488 Other symbolic dysfunctions: Secondary | ICD-10-CM | POA: Diagnosis not present

## 2016-09-18 DIAGNOSIS — R262 Difficulty in walking, not elsewhere classified: Secondary | ICD-10-CM | POA: Diagnosis not present

## 2016-09-18 DIAGNOSIS — R1312 Dysphagia, oropharyngeal phase: Secondary | ICD-10-CM | POA: Diagnosis not present

## 2016-09-20 DIAGNOSIS — M6281 Muscle weakness (generalized): Secondary | ICD-10-CM | POA: Diagnosis not present

## 2016-09-20 DIAGNOSIS — R488 Other symbolic dysfunctions: Secondary | ICD-10-CM | POA: Diagnosis not present

## 2016-09-20 DIAGNOSIS — J189 Pneumonia, unspecified organism: Secondary | ICD-10-CM | POA: Diagnosis not present

## 2016-09-20 DIAGNOSIS — R1312 Dysphagia, oropharyngeal phase: Secondary | ICD-10-CM | POA: Diagnosis not present

## 2016-09-20 DIAGNOSIS — R262 Difficulty in walking, not elsewhere classified: Secondary | ICD-10-CM | POA: Diagnosis not present

## 2016-09-21 DIAGNOSIS — M6281 Muscle weakness (generalized): Secondary | ICD-10-CM | POA: Diagnosis not present

## 2016-09-21 DIAGNOSIS — R262 Difficulty in walking, not elsewhere classified: Secondary | ICD-10-CM | POA: Diagnosis not present

## 2016-09-21 DIAGNOSIS — R488 Other symbolic dysfunctions: Secondary | ICD-10-CM | POA: Diagnosis not present

## 2016-09-21 DIAGNOSIS — R1312 Dysphagia, oropharyngeal phase: Secondary | ICD-10-CM | POA: Diagnosis not present

## 2016-09-21 DIAGNOSIS — J189 Pneumonia, unspecified organism: Secondary | ICD-10-CM | POA: Diagnosis not present

## 2016-09-22 DIAGNOSIS — R269 Unspecified abnormalities of gait and mobility: Secondary | ICD-10-CM | POA: Diagnosis not present

## 2016-09-22 DIAGNOSIS — Z9181 History of falling: Secondary | ICD-10-CM | POA: Diagnosis not present

## 2016-09-22 DIAGNOSIS — M6281 Muscle weakness (generalized): Secondary | ICD-10-CM | POA: Diagnosis not present

## 2016-09-22 DIAGNOSIS — R262 Difficulty in walking, not elsewhere classified: Secondary | ICD-10-CM | POA: Diagnosis not present

## 2016-09-22 DIAGNOSIS — R488 Other symbolic dysfunctions: Secondary | ICD-10-CM | POA: Diagnosis not present

## 2016-09-22 DIAGNOSIS — R1312 Dysphagia, oropharyngeal phase: Secondary | ICD-10-CM | POA: Diagnosis not present

## 2016-09-22 DIAGNOSIS — R279 Unspecified lack of coordination: Secondary | ICD-10-CM | POA: Diagnosis not present

## 2016-09-22 DIAGNOSIS — J189 Pneumonia, unspecified organism: Secondary | ICD-10-CM | POA: Diagnosis not present

## 2016-09-23 DIAGNOSIS — J189 Pneumonia, unspecified organism: Secondary | ICD-10-CM | POA: Diagnosis not present

## 2016-09-23 DIAGNOSIS — R262 Difficulty in walking, not elsewhere classified: Secondary | ICD-10-CM | POA: Diagnosis not present

## 2016-09-23 DIAGNOSIS — M6281 Muscle weakness (generalized): Secondary | ICD-10-CM | POA: Diagnosis not present

## 2016-09-23 DIAGNOSIS — R1312 Dysphagia, oropharyngeal phase: Secondary | ICD-10-CM | POA: Diagnosis not present

## 2016-09-23 DIAGNOSIS — R488 Other symbolic dysfunctions: Secondary | ICD-10-CM | POA: Diagnosis not present

## 2016-09-24 DIAGNOSIS — R488 Other symbolic dysfunctions: Secondary | ICD-10-CM | POA: Diagnosis not present

## 2016-09-24 DIAGNOSIS — R1312 Dysphagia, oropharyngeal phase: Secondary | ICD-10-CM | POA: Diagnosis not present

## 2016-09-24 DIAGNOSIS — M6281 Muscle weakness (generalized): Secondary | ICD-10-CM | POA: Diagnosis not present

## 2016-09-24 DIAGNOSIS — R262 Difficulty in walking, not elsewhere classified: Secondary | ICD-10-CM | POA: Diagnosis not present

## 2016-09-24 DIAGNOSIS — J189 Pneumonia, unspecified organism: Secondary | ICD-10-CM | POA: Diagnosis not present

## 2016-09-27 DIAGNOSIS — R488 Other symbolic dysfunctions: Secondary | ICD-10-CM | POA: Diagnosis not present

## 2016-09-27 DIAGNOSIS — R262 Difficulty in walking, not elsewhere classified: Secondary | ICD-10-CM | POA: Diagnosis not present

## 2016-09-27 DIAGNOSIS — R1312 Dysphagia, oropharyngeal phase: Secondary | ICD-10-CM | POA: Diagnosis not present

## 2016-09-27 DIAGNOSIS — J189 Pneumonia, unspecified organism: Secondary | ICD-10-CM | POA: Diagnosis not present

## 2016-09-27 DIAGNOSIS — M6281 Muscle weakness (generalized): Secondary | ICD-10-CM | POA: Diagnosis not present

## 2016-09-28 DIAGNOSIS — R262 Difficulty in walking, not elsewhere classified: Secondary | ICD-10-CM | POA: Diagnosis not present

## 2016-09-28 DIAGNOSIS — R488 Other symbolic dysfunctions: Secondary | ICD-10-CM | POA: Diagnosis not present

## 2016-09-28 DIAGNOSIS — R1312 Dysphagia, oropharyngeal phase: Secondary | ICD-10-CM | POA: Diagnosis not present

## 2016-09-28 DIAGNOSIS — J189 Pneumonia, unspecified organism: Secondary | ICD-10-CM | POA: Diagnosis not present

## 2016-09-28 DIAGNOSIS — M6281 Muscle weakness (generalized): Secondary | ICD-10-CM | POA: Diagnosis not present

## 2016-09-29 DIAGNOSIS — J189 Pneumonia, unspecified organism: Secondary | ICD-10-CM | POA: Diagnosis not present

## 2016-09-29 DIAGNOSIS — R05 Cough: Secondary | ICD-10-CM | POA: Diagnosis not present

## 2016-09-29 DIAGNOSIS — D649 Anemia, unspecified: Secondary | ICD-10-CM | POA: Diagnosis not present

## 2016-09-29 DIAGNOSIS — R0689 Other abnormalities of breathing: Secondary | ICD-10-CM | POA: Diagnosis not present

## 2016-09-29 DIAGNOSIS — Z9181 History of falling: Secondary | ICD-10-CM | POA: Diagnosis not present

## 2016-09-29 DIAGNOSIS — R279 Unspecified lack of coordination: Secondary | ICD-10-CM | POA: Diagnosis not present

## 2016-09-29 DIAGNOSIS — R262 Difficulty in walking, not elsewhere classified: Secondary | ICD-10-CM | POA: Diagnosis not present

## 2016-09-29 DIAGNOSIS — R269 Unspecified abnormalities of gait and mobility: Secondary | ICD-10-CM | POA: Diagnosis not present

## 2016-09-29 DIAGNOSIS — R488 Other symbolic dysfunctions: Secondary | ICD-10-CM | POA: Diagnosis not present

## 2016-09-29 DIAGNOSIS — M6281 Muscle weakness (generalized): Secondary | ICD-10-CM | POA: Diagnosis not present

## 2016-09-29 DIAGNOSIS — R1312 Dysphagia, oropharyngeal phase: Secondary | ICD-10-CM | POA: Diagnosis not present

## 2016-09-30 DIAGNOSIS — M6281 Muscle weakness (generalized): Secondary | ICD-10-CM | POA: Diagnosis not present

## 2016-09-30 DIAGNOSIS — R488 Other symbolic dysfunctions: Secondary | ICD-10-CM | POA: Diagnosis not present

## 2016-09-30 DIAGNOSIS — R1312 Dysphagia, oropharyngeal phase: Secondary | ICD-10-CM | POA: Diagnosis not present

## 2016-09-30 DIAGNOSIS — R262 Difficulty in walking, not elsewhere classified: Secondary | ICD-10-CM | POA: Diagnosis not present

## 2016-09-30 DIAGNOSIS — J189 Pneumonia, unspecified organism: Secondary | ICD-10-CM | POA: Diagnosis not present

## 2016-10-01 DIAGNOSIS — M6281 Muscle weakness (generalized): Secondary | ICD-10-CM | POA: Diagnosis not present

## 2016-10-01 DIAGNOSIS — R1312 Dysphagia, oropharyngeal phase: Secondary | ICD-10-CM | POA: Diagnosis not present

## 2016-10-01 DIAGNOSIS — R488 Other symbolic dysfunctions: Secondary | ICD-10-CM | POA: Diagnosis not present

## 2016-10-01 DIAGNOSIS — J189 Pneumonia, unspecified organism: Secondary | ICD-10-CM | POA: Diagnosis not present

## 2016-10-01 DIAGNOSIS — R262 Difficulty in walking, not elsewhere classified: Secondary | ICD-10-CM | POA: Diagnosis not present

## 2016-10-04 DIAGNOSIS — D649 Anemia, unspecified: Secondary | ICD-10-CM | POA: Diagnosis not present

## 2016-10-04 DIAGNOSIS — R1312 Dysphagia, oropharyngeal phase: Secondary | ICD-10-CM | POA: Diagnosis not present

## 2016-10-04 DIAGNOSIS — R262 Difficulty in walking, not elsewhere classified: Secondary | ICD-10-CM | POA: Diagnosis not present

## 2016-10-04 DIAGNOSIS — R488 Other symbolic dysfunctions: Secondary | ICD-10-CM | POA: Diagnosis not present

## 2016-10-04 DIAGNOSIS — M6281 Muscle weakness (generalized): Secondary | ICD-10-CM | POA: Diagnosis not present

## 2016-10-04 DIAGNOSIS — J189 Pneumonia, unspecified organism: Secondary | ICD-10-CM | POA: Diagnosis not present

## 2016-10-05 DIAGNOSIS — J189 Pneumonia, unspecified organism: Secondary | ICD-10-CM | POA: Diagnosis not present

## 2016-10-05 DIAGNOSIS — R1312 Dysphagia, oropharyngeal phase: Secondary | ICD-10-CM | POA: Diagnosis not present

## 2016-10-05 DIAGNOSIS — R488 Other symbolic dysfunctions: Secondary | ICD-10-CM | POA: Diagnosis not present

## 2016-10-05 DIAGNOSIS — R262 Difficulty in walking, not elsewhere classified: Secondary | ICD-10-CM | POA: Diagnosis not present

## 2016-10-05 DIAGNOSIS — M6281 Muscle weakness (generalized): Secondary | ICD-10-CM | POA: Diagnosis not present

## 2016-10-06 DIAGNOSIS — J189 Pneumonia, unspecified organism: Secondary | ICD-10-CM | POA: Diagnosis not present

## 2016-10-06 DIAGNOSIS — R279 Unspecified lack of coordination: Secondary | ICD-10-CM | POA: Diagnosis not present

## 2016-10-06 DIAGNOSIS — R1312 Dysphagia, oropharyngeal phase: Secondary | ICD-10-CM | POA: Diagnosis not present

## 2016-10-06 DIAGNOSIS — Z9181 History of falling: Secondary | ICD-10-CM | POA: Diagnosis not present

## 2016-10-06 DIAGNOSIS — R488 Other symbolic dysfunctions: Secondary | ICD-10-CM | POA: Diagnosis not present

## 2016-10-06 DIAGNOSIS — M6281 Muscle weakness (generalized): Secondary | ICD-10-CM | POA: Diagnosis not present

## 2016-10-06 DIAGNOSIS — R269 Unspecified abnormalities of gait and mobility: Secondary | ICD-10-CM | POA: Diagnosis not present

## 2016-10-06 DIAGNOSIS — R262 Difficulty in walking, not elsewhere classified: Secondary | ICD-10-CM | POA: Diagnosis not present

## 2016-10-07 DIAGNOSIS — J189 Pneumonia, unspecified organism: Secondary | ICD-10-CM | POA: Diagnosis not present

## 2016-10-07 DIAGNOSIS — R262 Difficulty in walking, not elsewhere classified: Secondary | ICD-10-CM | POA: Diagnosis not present

## 2016-10-07 DIAGNOSIS — R1312 Dysphagia, oropharyngeal phase: Secondary | ICD-10-CM | POA: Diagnosis not present

## 2016-10-07 DIAGNOSIS — M6281 Muscle weakness (generalized): Secondary | ICD-10-CM | POA: Diagnosis not present

## 2016-10-07 DIAGNOSIS — R488 Other symbolic dysfunctions: Secondary | ICD-10-CM | POA: Diagnosis not present

## 2016-10-08 DIAGNOSIS — M6281 Muscle weakness (generalized): Secondary | ICD-10-CM | POA: Diagnosis not present

## 2016-10-08 DIAGNOSIS — R262 Difficulty in walking, not elsewhere classified: Secondary | ICD-10-CM | POA: Diagnosis not present

## 2016-10-08 DIAGNOSIS — R1312 Dysphagia, oropharyngeal phase: Secondary | ICD-10-CM | POA: Diagnosis not present

## 2016-10-08 DIAGNOSIS — J189 Pneumonia, unspecified organism: Secondary | ICD-10-CM | POA: Diagnosis not present

## 2016-10-08 DIAGNOSIS — R488 Other symbolic dysfunctions: Secondary | ICD-10-CM | POA: Diagnosis not present

## 2016-10-10 DIAGNOSIS — M6281 Muscle weakness (generalized): Secondary | ICD-10-CM | POA: Diagnosis not present

## 2016-10-10 DIAGNOSIS — R1312 Dysphagia, oropharyngeal phase: Secondary | ICD-10-CM | POA: Diagnosis not present

## 2016-10-10 DIAGNOSIS — J189 Pneumonia, unspecified organism: Secondary | ICD-10-CM | POA: Diagnosis not present

## 2016-10-10 DIAGNOSIS — R262 Difficulty in walking, not elsewhere classified: Secondary | ICD-10-CM | POA: Diagnosis not present

## 2016-10-10 DIAGNOSIS — R488 Other symbolic dysfunctions: Secondary | ICD-10-CM | POA: Diagnosis not present

## 2016-10-11 DIAGNOSIS — R488 Other symbolic dysfunctions: Secondary | ICD-10-CM | POA: Diagnosis not present

## 2016-10-11 DIAGNOSIS — R262 Difficulty in walking, not elsewhere classified: Secondary | ICD-10-CM | POA: Diagnosis not present

## 2016-10-11 DIAGNOSIS — R1312 Dysphagia, oropharyngeal phase: Secondary | ICD-10-CM | POA: Diagnosis not present

## 2016-10-11 DIAGNOSIS — M6281 Muscle weakness (generalized): Secondary | ICD-10-CM | POA: Diagnosis not present

## 2016-10-11 DIAGNOSIS — J189 Pneumonia, unspecified organism: Secondary | ICD-10-CM | POA: Diagnosis not present

## 2016-10-12 DIAGNOSIS — J189 Pneumonia, unspecified organism: Secondary | ICD-10-CM | POA: Diagnosis not present

## 2016-10-12 DIAGNOSIS — R279 Unspecified lack of coordination: Secondary | ICD-10-CM | POA: Diagnosis not present

## 2016-10-12 DIAGNOSIS — Z9181 History of falling: Secondary | ICD-10-CM | POA: Diagnosis not present

## 2016-10-12 DIAGNOSIS — M6281 Muscle weakness (generalized): Secondary | ICD-10-CM | POA: Diagnosis not present

## 2016-10-12 DIAGNOSIS — R262 Difficulty in walking, not elsewhere classified: Secondary | ICD-10-CM | POA: Diagnosis not present

## 2016-10-12 DIAGNOSIS — L720 Epidermal cyst: Secondary | ICD-10-CM | POA: Diagnosis not present

## 2016-10-12 DIAGNOSIS — R488 Other symbolic dysfunctions: Secondary | ICD-10-CM | POA: Diagnosis not present

## 2016-10-12 DIAGNOSIS — R269 Unspecified abnormalities of gait and mobility: Secondary | ICD-10-CM | POA: Diagnosis not present

## 2016-10-12 DIAGNOSIS — R1310 Dysphagia, unspecified: Secondary | ICD-10-CM | POA: Diagnosis not present

## 2016-10-12 DIAGNOSIS — R1312 Dysphagia, oropharyngeal phase: Secondary | ICD-10-CM | POA: Diagnosis not present

## 2016-10-12 DIAGNOSIS — J449 Chronic obstructive pulmonary disease, unspecified: Secondary | ICD-10-CM | POA: Diagnosis not present

## 2016-10-13 DIAGNOSIS — R262 Difficulty in walking, not elsewhere classified: Secondary | ICD-10-CM | POA: Diagnosis not present

## 2016-10-13 DIAGNOSIS — R488 Other symbolic dysfunctions: Secondary | ICD-10-CM | POA: Diagnosis not present

## 2016-10-13 DIAGNOSIS — J189 Pneumonia, unspecified organism: Secondary | ICD-10-CM | POA: Diagnosis not present

## 2016-10-13 DIAGNOSIS — M6281 Muscle weakness (generalized): Secondary | ICD-10-CM | POA: Diagnosis not present

## 2016-10-13 DIAGNOSIS — R1312 Dysphagia, oropharyngeal phase: Secondary | ICD-10-CM | POA: Diagnosis not present

## 2016-10-14 DIAGNOSIS — R488 Other symbolic dysfunctions: Secondary | ICD-10-CM | POA: Diagnosis not present

## 2016-10-14 DIAGNOSIS — R1312 Dysphagia, oropharyngeal phase: Secondary | ICD-10-CM | POA: Diagnosis not present

## 2016-10-14 DIAGNOSIS — M6281 Muscle weakness (generalized): Secondary | ICD-10-CM | POA: Diagnosis not present

## 2016-10-14 DIAGNOSIS — J189 Pneumonia, unspecified organism: Secondary | ICD-10-CM | POA: Diagnosis not present

## 2016-10-14 DIAGNOSIS — R262 Difficulty in walking, not elsewhere classified: Secondary | ICD-10-CM | POA: Diagnosis not present

## 2016-10-15 DIAGNOSIS — J189 Pneumonia, unspecified organism: Secondary | ICD-10-CM | POA: Diagnosis not present

## 2016-10-15 DIAGNOSIS — R488 Other symbolic dysfunctions: Secondary | ICD-10-CM | POA: Diagnosis not present

## 2016-10-15 DIAGNOSIS — R1312 Dysphagia, oropharyngeal phase: Secondary | ICD-10-CM | POA: Diagnosis not present

## 2016-10-15 DIAGNOSIS — R262 Difficulty in walking, not elsewhere classified: Secondary | ICD-10-CM | POA: Diagnosis not present

## 2016-10-15 DIAGNOSIS — M6281 Muscle weakness (generalized): Secondary | ICD-10-CM | POA: Diagnosis not present

## 2016-10-18 DIAGNOSIS — M6281 Muscle weakness (generalized): Secondary | ICD-10-CM | POA: Diagnosis not present

## 2016-10-18 DIAGNOSIS — R221 Localized swelling, mass and lump, neck: Secondary | ICD-10-CM | POA: Diagnosis not present

## 2016-10-18 DIAGNOSIS — J189 Pneumonia, unspecified organism: Secondary | ICD-10-CM | POA: Diagnosis not present

## 2016-10-18 DIAGNOSIS — R262 Difficulty in walking, not elsewhere classified: Secondary | ICD-10-CM | POA: Diagnosis not present

## 2016-10-18 DIAGNOSIS — R488 Other symbolic dysfunctions: Secondary | ICD-10-CM | POA: Diagnosis not present

## 2016-10-18 DIAGNOSIS — R1312 Dysphagia, oropharyngeal phase: Secondary | ICD-10-CM | POA: Diagnosis not present

## 2016-10-19 DIAGNOSIS — R488 Other symbolic dysfunctions: Secondary | ICD-10-CM | POA: Diagnosis not present

## 2016-10-19 DIAGNOSIS — R262 Difficulty in walking, not elsewhere classified: Secondary | ICD-10-CM | POA: Diagnosis not present

## 2016-10-19 DIAGNOSIS — R1312 Dysphagia, oropharyngeal phase: Secondary | ICD-10-CM | POA: Diagnosis not present

## 2016-10-19 DIAGNOSIS — M6281 Muscle weakness (generalized): Secondary | ICD-10-CM | POA: Diagnosis not present

## 2016-10-19 DIAGNOSIS — J189 Pneumonia, unspecified organism: Secondary | ICD-10-CM | POA: Diagnosis not present

## 2016-10-20 DIAGNOSIS — M6281 Muscle weakness (generalized): Secondary | ICD-10-CM | POA: Diagnosis not present

## 2016-10-20 DIAGNOSIS — Z9181 History of falling: Secondary | ICD-10-CM | POA: Diagnosis not present

## 2016-10-20 DIAGNOSIS — R279 Unspecified lack of coordination: Secondary | ICD-10-CM | POA: Diagnosis not present

## 2016-10-20 DIAGNOSIS — R269 Unspecified abnormalities of gait and mobility: Secondary | ICD-10-CM | POA: Diagnosis not present

## 2016-10-25 ENCOUNTER — Ambulatory Visit
Admission: RE | Admit: 2016-10-25 | Discharge: 2016-10-25 | Disposition: A | Discharge status: Home / Self Care | Payer: Self-pay | Source: Ambulatory Visit | Attending: Internal Medicine | Admitting: Internal Medicine

## 2016-10-25 ENCOUNTER — Other Ambulatory Visit: Payer: Self-pay | Admitting: Internal Medicine

## 2016-10-25 DIAGNOSIS — J438 Other emphysema: Secondary | ICD-10-CM

## 2016-11-04 DIAGNOSIS — R05 Cough: Secondary | ICD-10-CM | POA: Diagnosis not present

## 2016-11-08 DIAGNOSIS — J690 Pneumonitis due to inhalation of food and vomit: Secondary | ICD-10-CM | POA: Diagnosis not present

## 2016-11-08 DIAGNOSIS — J9611 Chronic respiratory failure with hypoxia: Secondary | ICD-10-CM | POA: Diagnosis not present

## 2016-11-08 DIAGNOSIS — J449 Chronic obstructive pulmonary disease, unspecified: Secondary | ICD-10-CM | POA: Diagnosis not present

## 2016-11-16 DIAGNOSIS — J189 Pneumonia, unspecified organism: Secondary | ICD-10-CM | POA: Diagnosis not present

## 2016-11-16 DIAGNOSIS — J449 Chronic obstructive pulmonary disease, unspecified: Secondary | ICD-10-CM | POA: Diagnosis not present

## 2016-11-16 DIAGNOSIS — E1122 Type 2 diabetes mellitus with diabetic chronic kidney disease: Secondary | ICD-10-CM | POA: Diagnosis not present

## 2016-11-16 DIAGNOSIS — R1310 Dysphagia, unspecified: Secondary | ICD-10-CM | POA: Diagnosis not present

## 2016-11-18 DIAGNOSIS — R1310 Dysphagia, unspecified: Secondary | ICD-10-CM | POA: Diagnosis not present

## 2016-11-18 DIAGNOSIS — E785 Hyperlipidemia, unspecified: Secondary | ICD-10-CM | POA: Diagnosis not present

## 2016-11-18 DIAGNOSIS — R05 Cough: Secondary | ICD-10-CM | POA: Diagnosis not present

## 2016-11-18 DIAGNOSIS — J449 Chronic obstructive pulmonary disease, unspecified: Secondary | ICD-10-CM | POA: Diagnosis not present

## 2016-11-18 DIAGNOSIS — R0603 Acute respiratory distress: Secondary | ICD-10-CM | POA: Diagnosis not present

## 2016-11-18 DIAGNOSIS — E1122 Type 2 diabetes mellitus with diabetic chronic kidney disease: Secondary | ICD-10-CM | POA: Diagnosis not present

## 2016-11-18 DIAGNOSIS — R0902 Hypoxemia: Secondary | ICD-10-CM | POA: Diagnosis not present

## 2016-11-18 DIAGNOSIS — E119 Type 2 diabetes mellitus without complications: Secondary | ICD-10-CM | POA: Diagnosis not present

## 2016-11-19 IMAGING — CT CT ABD-PELV W/ CM
2 of 5 series · 13 of 46 positions shown, 15 images · IV contrast (ISOVUE)
Comparison: CT abdomen and pelvis February 06, 2016 ; abdominal
radiographic series February 28, 2016

CLINICAL DATA: Abdominal pain with nausea and vomiting

EXAM:
CT ABDOMEN AND PELVIS WITH CONTRAST
TECHNIQUE: Multidetector CT imaging of the abdomen and pelvis was performed
using the standard protocol following bolus administration of
intravenous contrast.
CONTRAST:  75mL 98QLOH-QXX IOPAMIDOL (98QLOH-QXX) INJECTION 61%

[Series 2: abd/pel with · axial · 0.86mm/px · z∈[+847,+1237]mm · 10 of 92 slices shown, 12 images]
[im 7/92  soft-tissue]
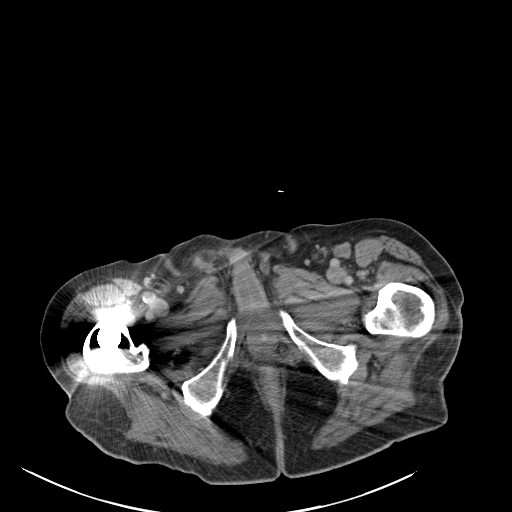
[im 7/92  bone]
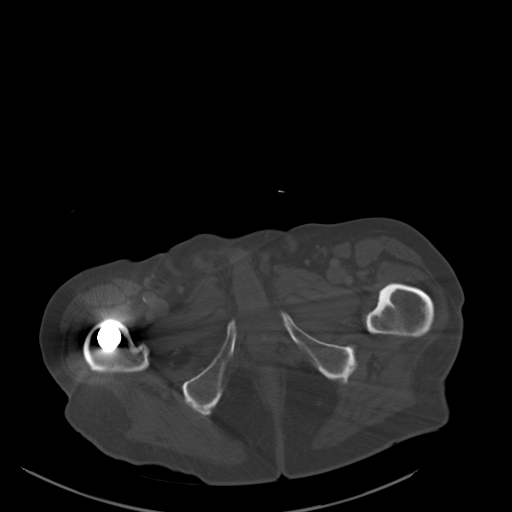
[im 14/92  soft-tissue]
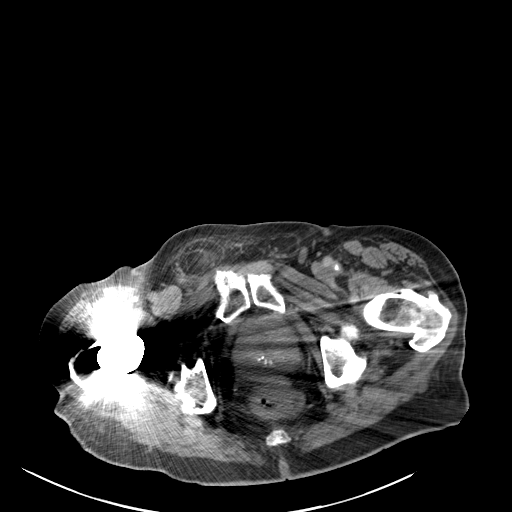
[im 27/92  soft-tissue]
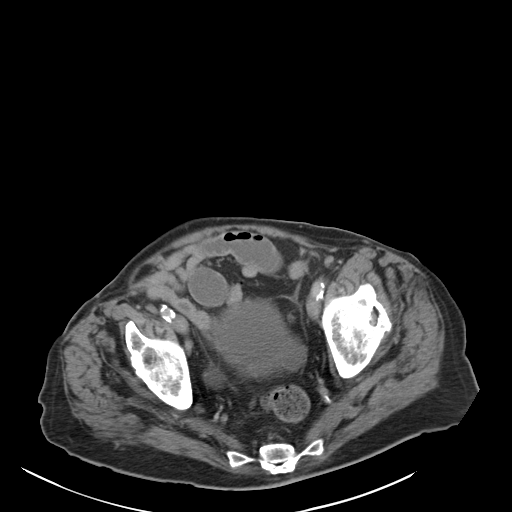
[im 33/92  soft-tissue]
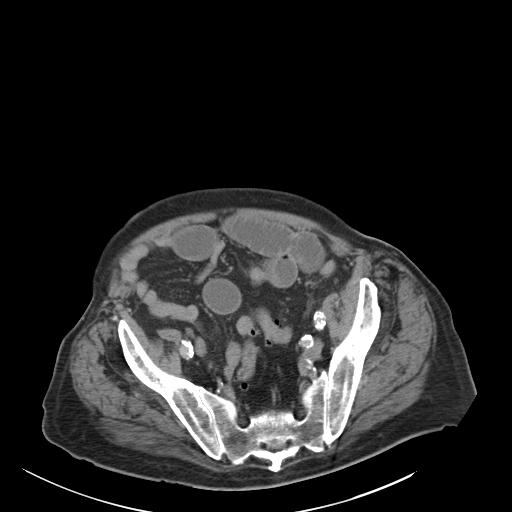
[im 40/92  soft-tissue]
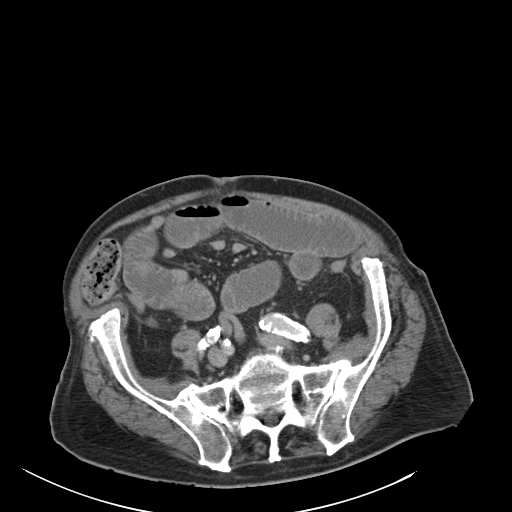
[im 53/92  soft-tissue]
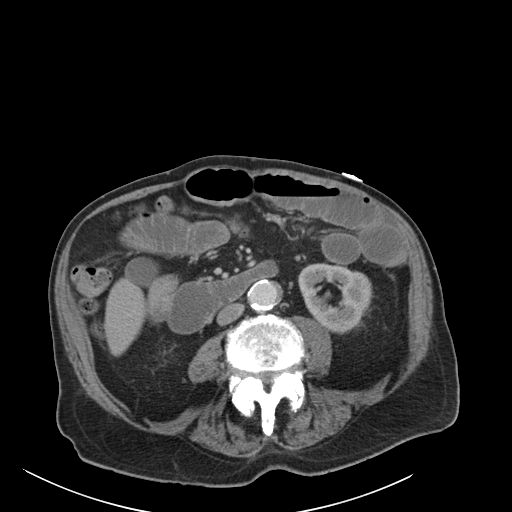
[im 59/92  soft-tissue]
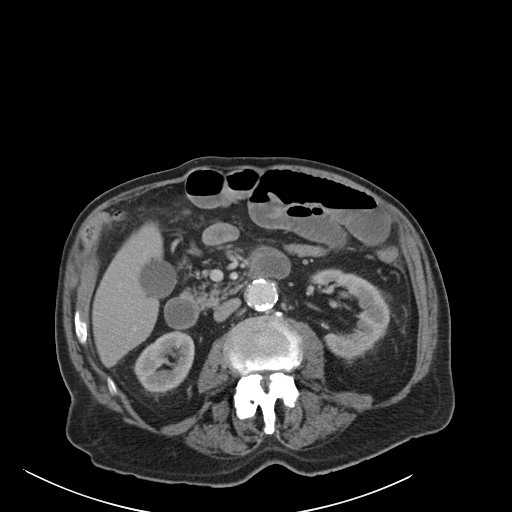
[im 66/92  soft-tissue]
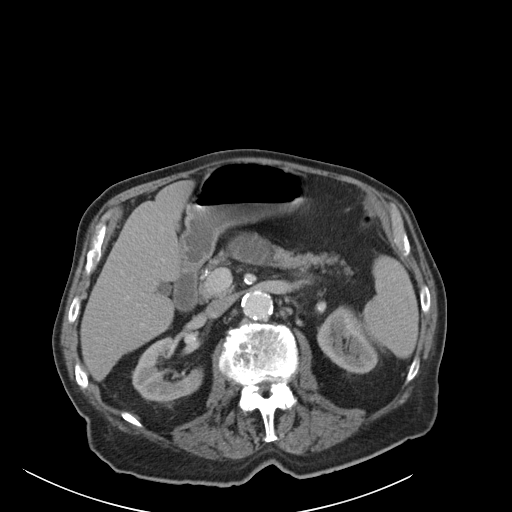
[im 79/92  soft-tissue]
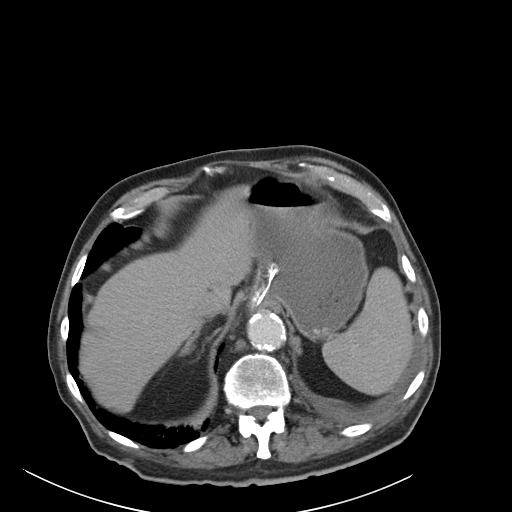
[im 79/92  bone]
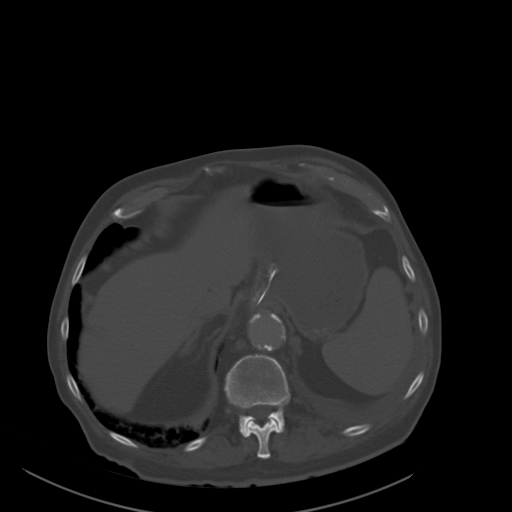
[im 85/92  soft-tissue]
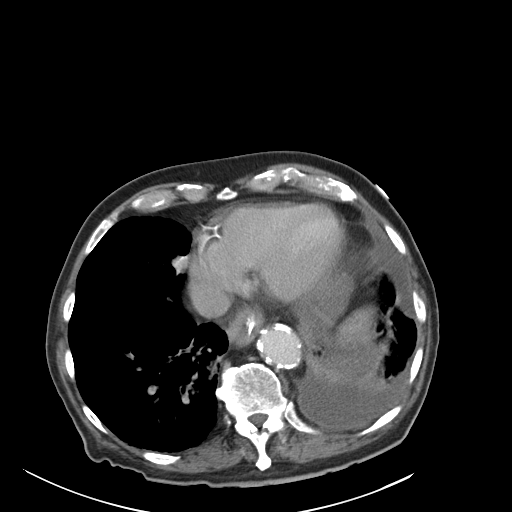

[Series 5: coronal a/|p · coronal · 0.79mm/px · 3 of 159 slices shown]
[im 53/159  soft-tissue]
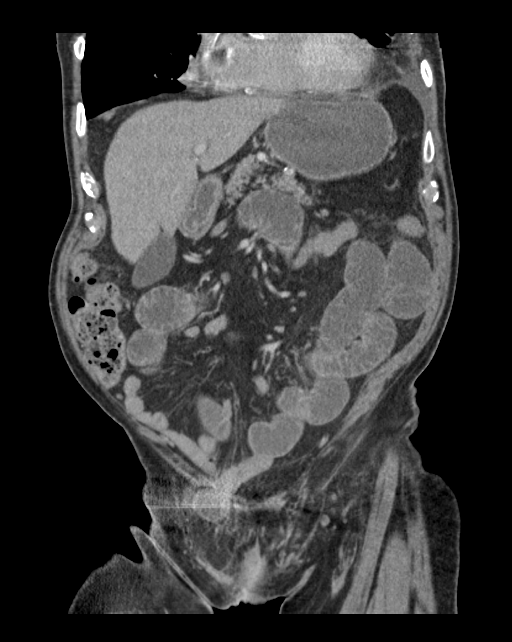
[im 71/159  soft-tissue]
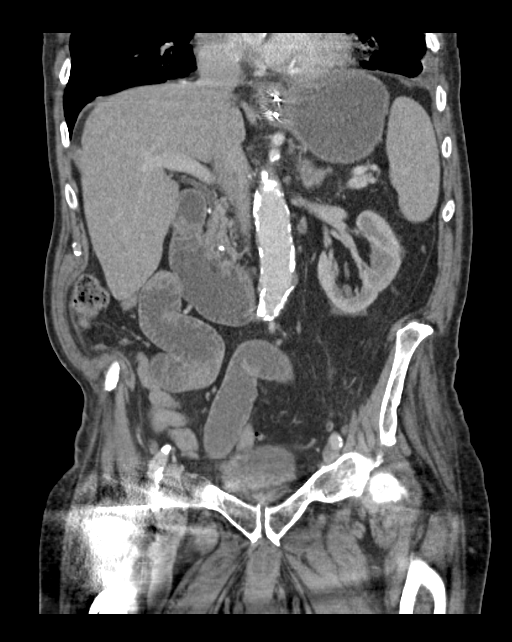
[im 88/159  soft-tissue]
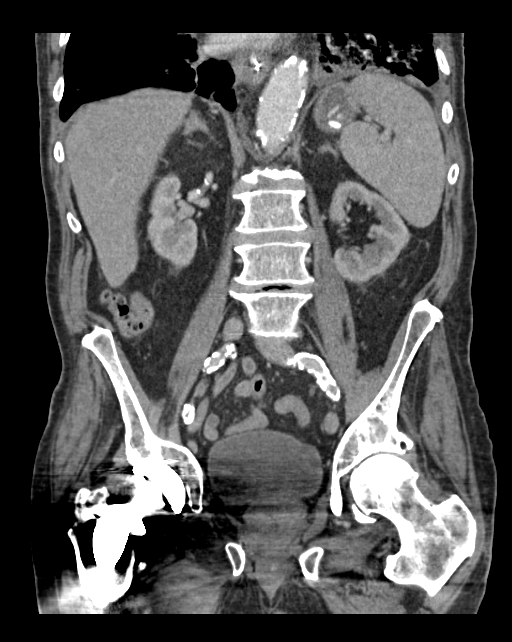

[13 of 46 positions shown; findings below may reference images not displayed]

FINDINGS: Lower chest: There is airspace consolidation throughout much of the
left lower lobe and inferior lingula with moderate pleural effusion
on the left. There is patchy consolidation in the medial and
posterior segments of the right lower lobe. There is atelectatic
change in the right middle lobe. There is a focal stable nodular
opacity in the lateral segment of the right lower lobe measuring 8 x
6 mm. There are multiple foci of coronary artery calcification.
There is a hiatal hernia.

Hepatobiliary: No focal liver lesions are evident. Gallbladder wall
is not appreciably thickened. There is no biliary duct dilatation.

Pancreas: There is no pancreatic mass or inflammatory focus.

Spleen: No splenic lesions are evident.

Adrenals/Urinary Tract: The right adrenal appears normal. There is
an apparent adenoma in the left adrenal measuring 1.2 x 1.2 cm.
Kidneys bilaterally show no mass or hydronephrosis on either side.
There is moderate prominence of renal sinus fat bilaterally. There
is no renal or ureteral calculus bilaterally. Urinary bladder is
midline with overall wall thickness within normal limits. There is a
diverticulum arising from the rightward aspect of the urinary
bladder measuring 3.4 x 2.2 cm. Note also that there is mild
irregular thickening along the posterior aspect of the urinary
bladder wall.

Stomach/Bowel: There is proximal small bowel obstruction which
extends to the level of a right inguinal hernia. Bowel extends into
this hernia. A transition zone is noted at the level of the right
inguinal hernia indicative of a degree of bowel obstruction. This
appearance suggests that there may be a degree of incarceration in
this right inguinal hernia. There is no appreciable bowel wall or
mesenteric thickening. Nasogastric tube tip is in the stomach.
Stomach is filled with fluid and air. No free air or portal venous
air is evident. No bowel pneumatosis evident.

Vascular/Lymphatic: There is extensive atherosclerotic calcification
throughout the aorta and major pelvic arterial vessels extending to
the proximal thigh regions. There is dilatation of the distal
abdominal aorta with a maximum transverse diameter of 3.0 x 3.0 cm.
There is no periaortic fluid. There is calcification at the origins
of the major mesenteric vessels. There is no appreciable adenopathy
in the abdomen or pelvis.

Reproductive: Prostate is prominent, containing multiple
calcifications. There is no pelvic mass outside of the prostate and
potential urinary bladder. No pelvic fluid collection evident.

Other: Appendix appears unremarkable. No abscess or ascites evident
in the abdomen or pelvis. In addition to the apparent incarcerated
right inguinal hernia. There is fat in the left inguinal ring.

Musculoskeletal: There is extensive lumbar spine osteoarthritic
change. There is spinal stenosis at L2-3, L3-4, L4-5, and L5-S1 due
to extensive bony hypertrophy and diffuse disc protrusion. There are
no blastic or lytic bone lesions. There is no intramuscular or
abdominal wall lesion. Note that the patient has had a previous
total hip replacement right. There is moderate osteoarthritic change
in the left hip joint.
IMPRESSION: Bowel obstruction at the level of a right inguinal hernia. Suspect a
degree of incarceration in this hernia. Transition zone noted at the
level of this hernia near the jejunoileal junction. No free air.

Extensive airspace consolidation in the inferior lingula and left
lower lobe with fairly small left pleural effusion. There is also
patchy infiltrate in the posterior and medial segments of the right
lower lobe.

7 x 5 mm nodular opacity right lower lobe. Non-contrast chest CT at
6-12 months is recommended. If the nodule is stable at time of
repeat CT, then future CT at 18-24 months (from today's scan) is
considered optional for low-risk patients, but is recommended for
high-risk patients. This recommendation follows the consensus
statement: Guidelines for Management of Incidental Pulmonary Nodules
Detected on CT Images: From the [HOSPITAL] 4054; Radiology
4054; [DATE].

Mild irregularity along the urinary bladder wall posteriorly raises
concern for potential inflammation or early neoplasm. This finding
may well warrant direct visualization/cystoscopy to further
evaluate. There is a rightward located urinary bladder diverticulum.
No renal or ureteral calculus. No hydronephrosis.

Prominent prostate with multiple prostatic calculi. Advise
correlation with PSA.

Small left adrenal adenoma.

Extensive atherosclerotic calcification in the aorta and essentially
all abdominal and pelvic arterial vessels as well as coronary artery
calcification. Mild dilatation of the distal aorta. Recommend
followup by ultrasound in 3 years. This recommendation follows ACR
consensus guidelines: White Paper of the ACR Incidental Findings
Committee II on Vascular Findings. [HOSPITAL] 0833;
[DATE].

Multilevel spinal stenosis, multifactorial.

Small hiatal hernia. Stomach filled with fluid and air. Nasogastric
tube tip in stomach.

Critical Value/emergent results were called by telephone at the time
of interpretation on 02/28/2016 at [DATE] to Dr. GENOVEVA HAROON , who
verbally acknowledged these results.

## 2016-12-03 DIAGNOSIS — M6281 Muscle weakness (generalized): Secondary | ICD-10-CM | POA: Diagnosis not present

## 2016-12-03 DIAGNOSIS — Z9181 History of falling: Secondary | ICD-10-CM | POA: Diagnosis not present

## 2016-12-03 DIAGNOSIS — R279 Unspecified lack of coordination: Secondary | ICD-10-CM | POA: Diagnosis not present

## 2016-12-03 DIAGNOSIS — R269 Unspecified abnormalities of gait and mobility: Secondary | ICD-10-CM | POA: Diagnosis not present

## 2016-12-08 DIAGNOSIS — I739 Peripheral vascular disease, unspecified: Secondary | ICD-10-CM | POA: Diagnosis not present

## 2016-12-08 DIAGNOSIS — B351 Tinea unguium: Secondary | ICD-10-CM | POA: Diagnosis not present

## 2016-12-08 DIAGNOSIS — L603 Nail dystrophy: Secondary | ICD-10-CM | POA: Diagnosis not present

## 2016-12-10 DIAGNOSIS — R279 Unspecified lack of coordination: Secondary | ICD-10-CM | POA: Diagnosis not present

## 2016-12-10 DIAGNOSIS — M6281 Muscle weakness (generalized): Secondary | ICD-10-CM | POA: Diagnosis not present

## 2016-12-10 DIAGNOSIS — R269 Unspecified abnormalities of gait and mobility: Secondary | ICD-10-CM | POA: Diagnosis not present

## 2016-12-10 DIAGNOSIS — Z9181 History of falling: Secondary | ICD-10-CM | POA: Diagnosis not present

## 2016-12-12 IMAGING — DX DG CHEST 1V PORT
1 series · 1 of 1 positions shown · non-contrast
Comparison: Radiograph March 18, 2016.

CLINICAL DATA: Acute respiratory failure.

EXAM:
PORTABLE CHEST 1 VIEW

[chest ap]
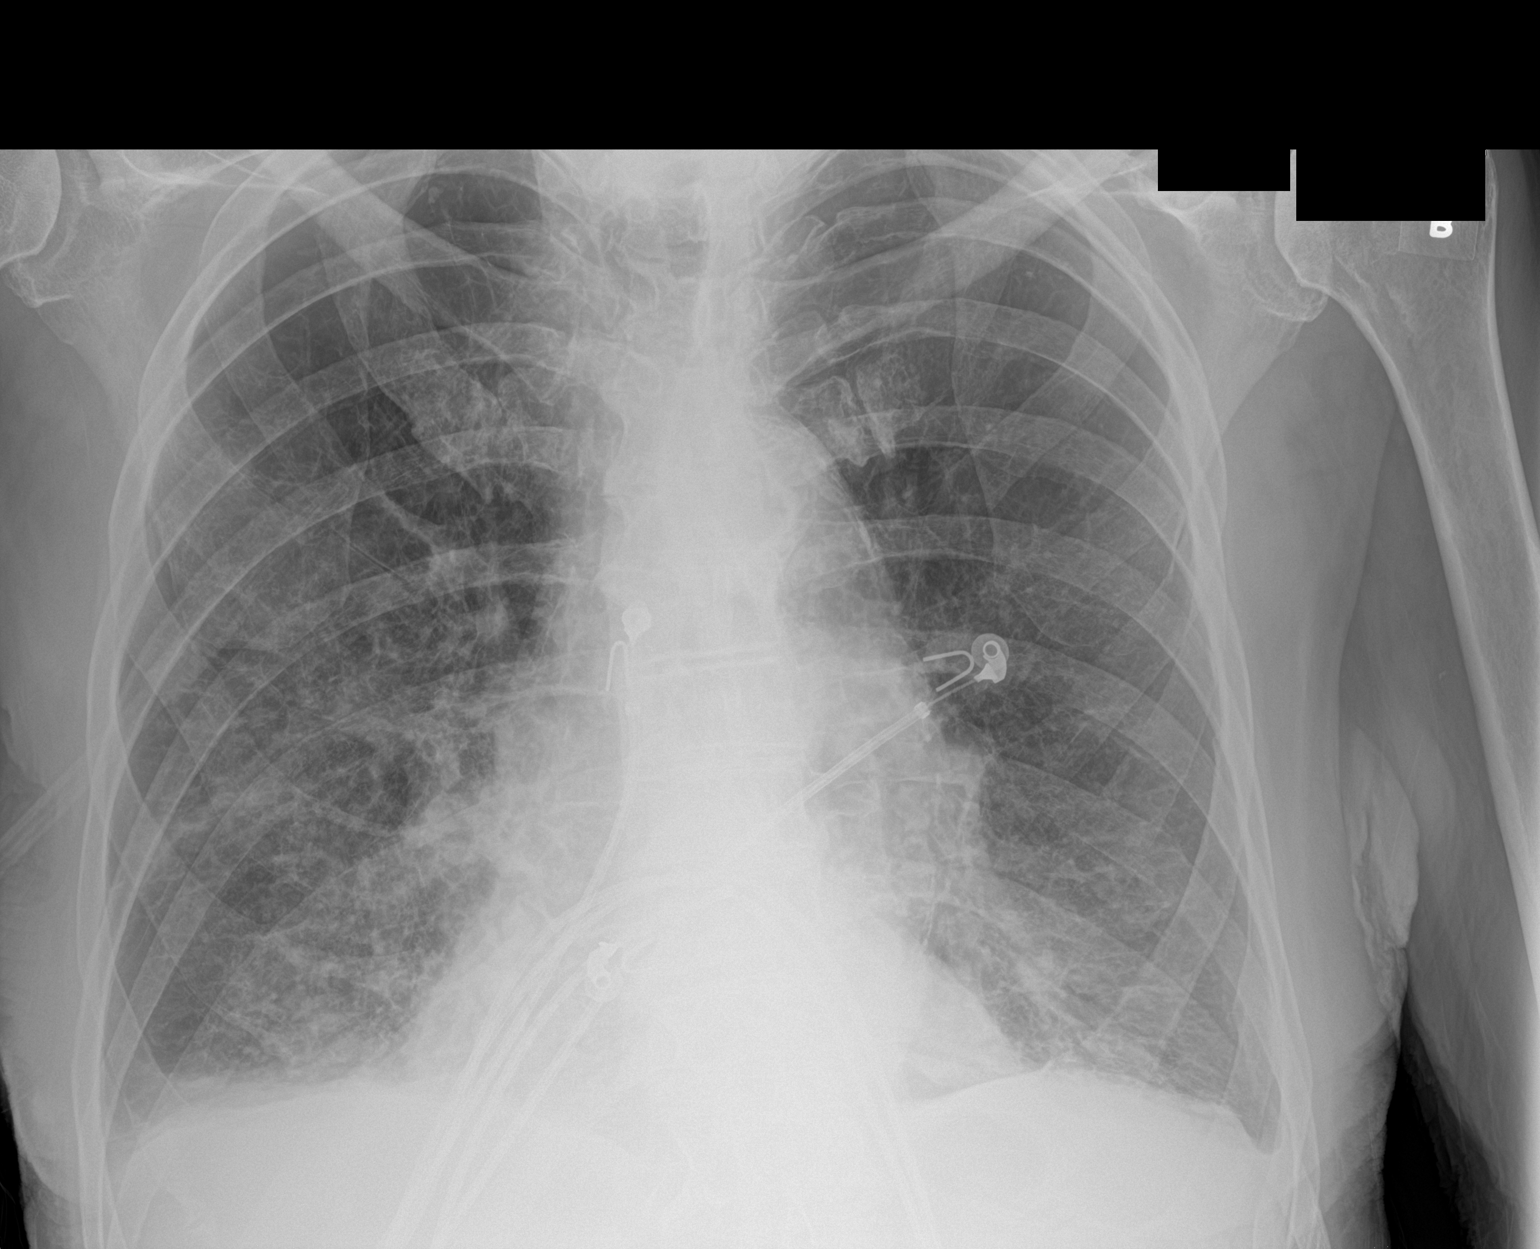

[1 of 1 positions shown; findings below may reference images not displayed]

FINDINGS: Stable cardiomediastinal silhouette. Atherosclerosis of thoracic
aorta is noted. Stable bilateral basilar interstitial densities are
noted which may represent scarring or possibly edema. Minimal
bilateral pleural effusions are noted. No pneumothorax is noted.
Bony thorax is unremarkable.
IMPRESSION: Stable bibasilar interstitial densities are noted concerning for
edema or scarring, with minimal associated pleural effusions. Aortic
atherosclerosis.

## 2016-12-18 IMAGING — CR DG CHEST 1V PORT
2 series · 2 of 2 positions shown · non-contrast
Comparison: 03/22/2016

CLINICAL DATA: Respiratory failure

EXAM:
PORTABLE CHEST 1 VIEW

[AP (1 of 2)]
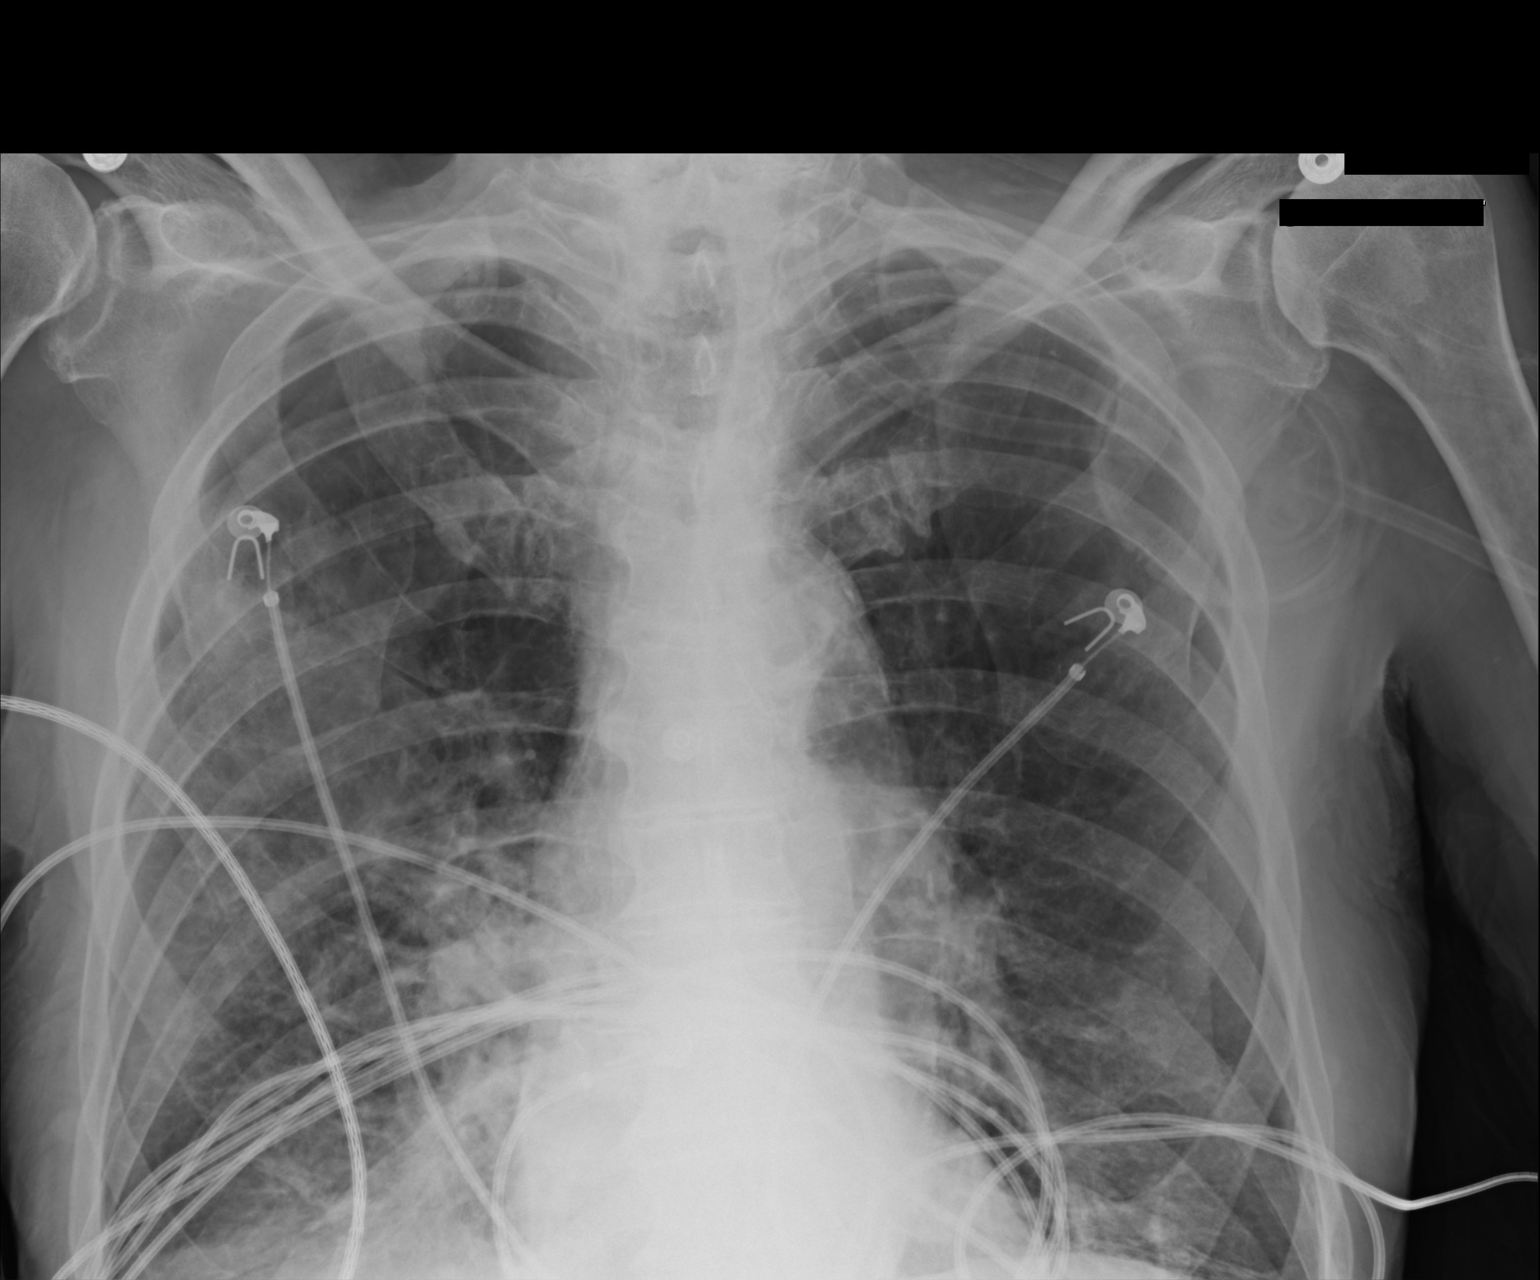

[AP (2 of 2)]
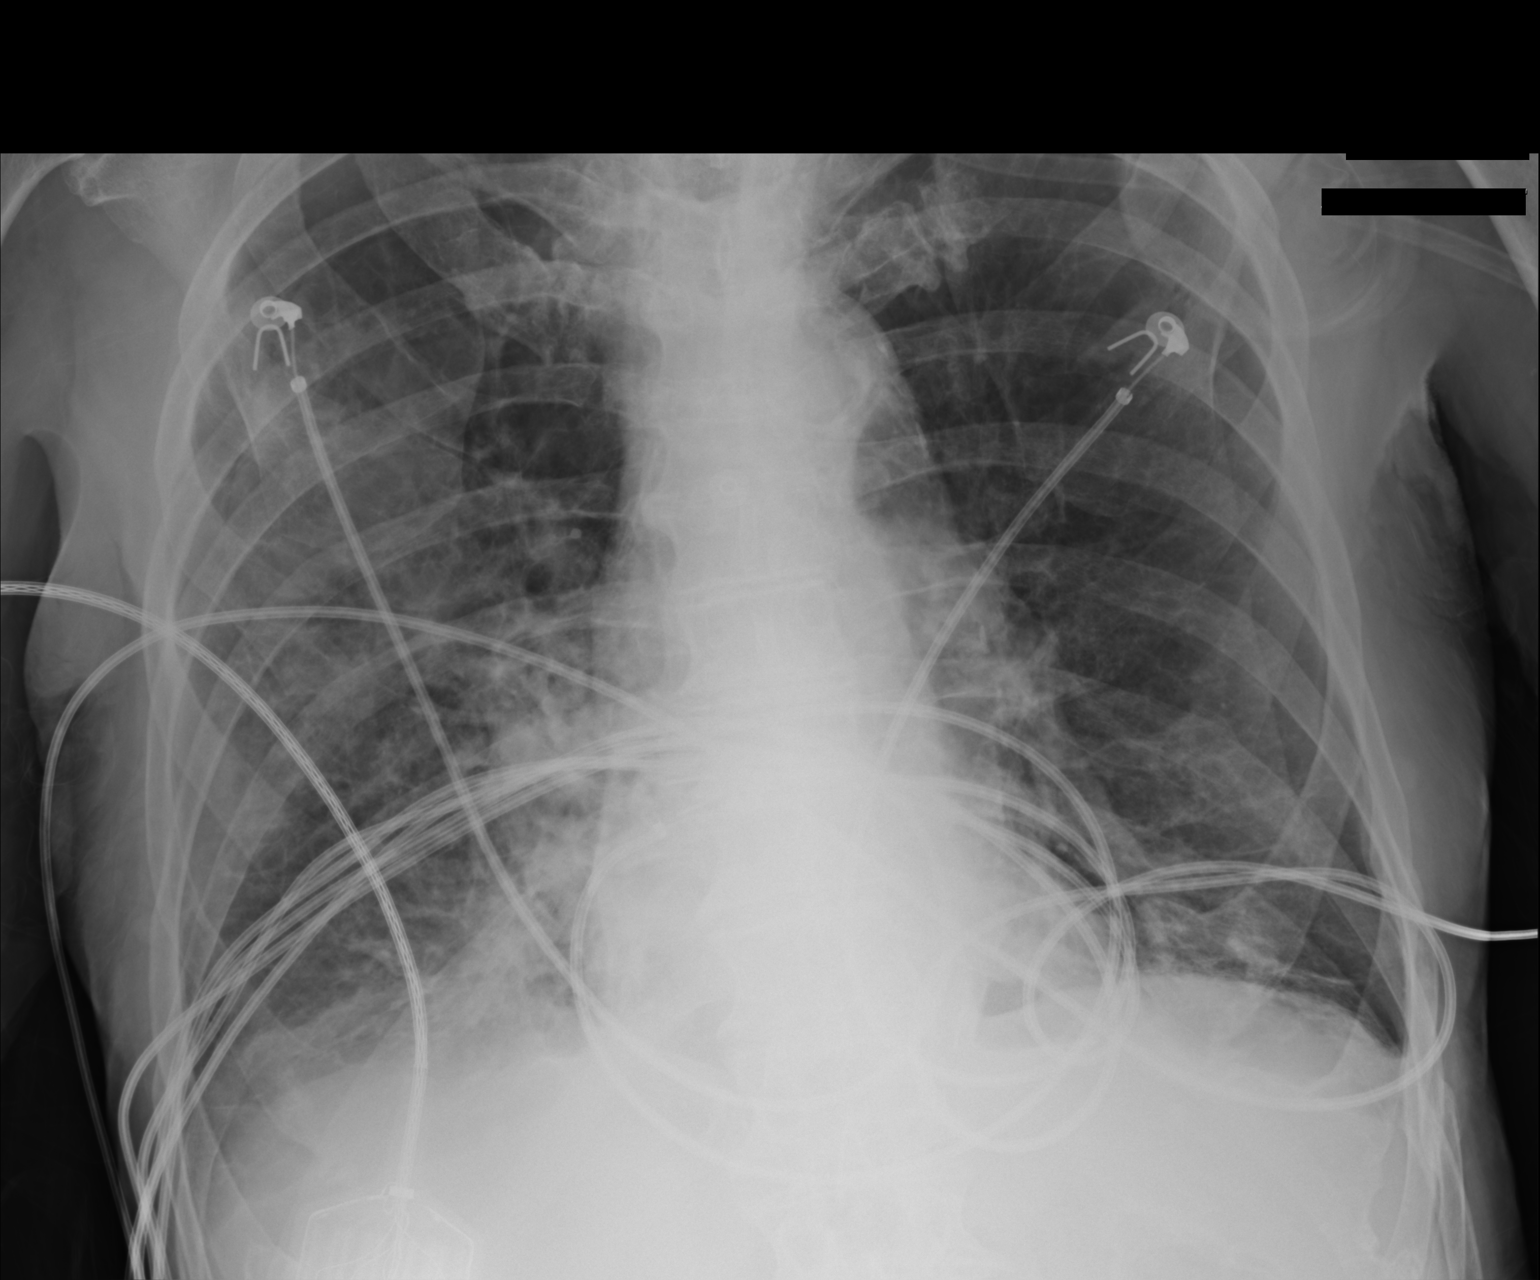

[2 of 2 positions shown; findings below may reference images not displayed]

FINDINGS: There is hyperinflation of the lungs compatible with COPD. Heart is
normal size. Patchy opacity in the right lower lobe, not
significantly changed. Left base atelectasis. No effusions or acute
bony abnormality.
IMPRESSION: Right lower lobe atelectasis or infiltrate.  Left base atelectasis.

COPD.

## 2016-12-22 DIAGNOSIS — Z9181 History of falling: Secondary | ICD-10-CM | POA: Diagnosis not present

## 2016-12-22 DIAGNOSIS — M6281 Muscle weakness (generalized): Secondary | ICD-10-CM | POA: Diagnosis not present

## 2016-12-22 DIAGNOSIS — R279 Unspecified lack of coordination: Secondary | ICD-10-CM | POA: Diagnosis not present

## 2016-12-22 DIAGNOSIS — R269 Unspecified abnormalities of gait and mobility: Secondary | ICD-10-CM | POA: Diagnosis not present

## 2016-12-23 DIAGNOSIS — I129 Hypertensive chronic kidney disease with stage 1 through stage 4 chronic kidney disease, or unspecified chronic kidney disease: Secondary | ICD-10-CM | POA: Diagnosis not present

## 2016-12-23 DIAGNOSIS — N139 Obstructive and reflux uropathy, unspecified: Secondary | ICD-10-CM | POA: Diagnosis not present

## 2016-12-23 DIAGNOSIS — J449 Chronic obstructive pulmonary disease, unspecified: Secondary | ICD-10-CM | POA: Diagnosis not present

## 2016-12-23 DIAGNOSIS — L720 Epidermal cyst: Secondary | ICD-10-CM | POA: Diagnosis not present

## 2016-12-23 DIAGNOSIS — J189 Pneumonia, unspecified organism: Secondary | ICD-10-CM | POA: Diagnosis not present

## 2016-12-23 DIAGNOSIS — E1122 Type 2 diabetes mellitus with diabetic chronic kidney disease: Secondary | ICD-10-CM | POA: Diagnosis not present

## 2016-12-23 DIAGNOSIS — E1169 Type 2 diabetes mellitus with other specified complication: Secondary | ICD-10-CM | POA: Diagnosis not present

## 2017-01-06 DIAGNOSIS — J189 Pneumonia, unspecified organism: Secondary | ICD-10-CM | POA: Diagnosis not present

## 2017-01-06 DIAGNOSIS — R1310 Dysphagia, unspecified: Secondary | ICD-10-CM | POA: Diagnosis not present

## 2017-01-12 DIAGNOSIS — M6281 Muscle weakness (generalized): Secondary | ICD-10-CM | POA: Diagnosis not present

## 2017-01-12 DIAGNOSIS — R1310 Dysphagia, unspecified: Secondary | ICD-10-CM | POA: Diagnosis not present

## 2017-01-12 DIAGNOSIS — J189 Pneumonia, unspecified organism: Secondary | ICD-10-CM | POA: Diagnosis not present

## 2017-01-13 DIAGNOSIS — J189 Pneumonia, unspecified organism: Secondary | ICD-10-CM | POA: Diagnosis not present

## 2017-01-13 DIAGNOSIS — R1310 Dysphagia, unspecified: Secondary | ICD-10-CM | POA: Diagnosis not present

## 2017-01-14 DIAGNOSIS — J189 Pneumonia, unspecified organism: Secondary | ICD-10-CM | POA: Diagnosis not present

## 2017-01-14 DIAGNOSIS — R1310 Dysphagia, unspecified: Secondary | ICD-10-CM | POA: Diagnosis not present

## 2017-01-17 DIAGNOSIS — R1310 Dysphagia, unspecified: Secondary | ICD-10-CM | POA: Diagnosis not present

## 2017-01-17 DIAGNOSIS — J189 Pneumonia, unspecified organism: Secondary | ICD-10-CM | POA: Diagnosis not present

## 2017-01-18 DIAGNOSIS — J189 Pneumonia, unspecified organism: Secondary | ICD-10-CM | POA: Diagnosis not present

## 2017-01-18 DIAGNOSIS — R1310 Dysphagia, unspecified: Secondary | ICD-10-CM | POA: Diagnosis not present

## 2017-01-20 DIAGNOSIS — R1310 Dysphagia, unspecified: Secondary | ICD-10-CM | POA: Diagnosis not present

## 2017-01-20 DIAGNOSIS — J189 Pneumonia, unspecified organism: Secondary | ICD-10-CM | POA: Diagnosis not present

## 2017-01-24 DIAGNOSIS — A419 Sepsis, unspecified organism: Secondary | ICD-10-CM | POA: Diagnosis not present

## 2017-01-24 DIAGNOSIS — R278 Other lack of coordination: Secondary | ICD-10-CM | POA: Diagnosis not present

## 2017-01-24 DIAGNOSIS — Z9981 Dependence on supplemental oxygen: Secondary | ICD-10-CM | POA: Diagnosis not present

## 2017-01-24 DIAGNOSIS — R300 Dysuria: Secondary | ICD-10-CM | POA: Diagnosis not present

## 2017-01-24 DIAGNOSIS — Z66 Do not resuscitate: Secondary | ICD-10-CM | POA: Diagnosis present

## 2017-01-24 DIAGNOSIS — E872 Acidosis: Secondary | ICD-10-CM | POA: Diagnosis not present

## 2017-01-24 DIAGNOSIS — D72829 Elevated white blood cell count, unspecified: Secondary | ICD-10-CM | POA: Diagnosis not present

## 2017-01-24 DIAGNOSIS — J690 Pneumonitis due to inhalation of food and vomit: Secondary | ICD-10-CM | POA: Diagnosis not present

## 2017-01-24 DIAGNOSIS — J90 Pleural effusion, not elsewhere classified: Secondary | ICD-10-CM | POA: Diagnosis not present

## 2017-01-24 DIAGNOSIS — R05 Cough: Secondary | ICD-10-CM | POA: Diagnosis not present

## 2017-01-24 DIAGNOSIS — R319 Hematuria, unspecified: Secondary | ICD-10-CM | POA: Diagnosis not present

## 2017-01-24 DIAGNOSIS — J9601 Acute respiratory failure with hypoxia: Secondary | ICD-10-CM | POA: Diagnosis not present

## 2017-01-24 DIAGNOSIS — J441 Chronic obstructive pulmonary disease with (acute) exacerbation: Secondary | ICD-10-CM | POA: Diagnosis present

## 2017-01-24 DIAGNOSIS — N281 Cyst of kidney, acquired: Secondary | ICD-10-CM | POA: Diagnosis not present

## 2017-01-24 DIAGNOSIS — E785 Hyperlipidemia, unspecified: Secondary | ICD-10-CM | POA: Diagnosis present

## 2017-01-24 DIAGNOSIS — I4891 Unspecified atrial fibrillation: Secondary | ICD-10-CM | POA: Diagnosis not present

## 2017-01-24 DIAGNOSIS — Z87891 Personal history of nicotine dependence: Secondary | ICD-10-CM | POA: Diagnosis not present

## 2017-01-24 DIAGNOSIS — Z452 Encounter for adjustment and management of vascular access device: Secondary | ICD-10-CM | POA: Diagnosis not present

## 2017-01-24 DIAGNOSIS — N2889 Other specified disorders of kidney and ureter: Secondary | ICD-10-CM | POA: Diagnosis not present

## 2017-01-24 DIAGNOSIS — D62 Acute posthemorrhagic anemia: Secondary | ICD-10-CM | POA: Diagnosis not present

## 2017-01-24 DIAGNOSIS — M6281 Muscle weakness (generalized): Secondary | ICD-10-CM | POA: Diagnosis not present

## 2017-01-24 DIAGNOSIS — E870 Hyperosmolality and hypernatremia: Secondary | ICD-10-CM | POA: Diagnosis not present

## 2017-01-24 DIAGNOSIS — J189 Pneumonia, unspecified organism: Secondary | ICD-10-CM | POA: Diagnosis not present

## 2017-01-24 DIAGNOSIS — J9811 Atelectasis: Secondary | ICD-10-CM | POA: Diagnosis not present

## 2017-01-24 DIAGNOSIS — I480 Paroxysmal atrial fibrillation: Secondary | ICD-10-CM | POA: Diagnosis present

## 2017-01-24 DIAGNOSIS — E119 Type 2 diabetes mellitus without complications: Secondary | ICD-10-CM | POA: Diagnosis present

## 2017-01-24 DIAGNOSIS — Z743 Need for continuous supervision: Secondary | ICD-10-CM | POA: Diagnosis not present

## 2017-01-24 DIAGNOSIS — J984 Other disorders of lung: Secondary | ICD-10-CM | POA: Diagnosis not present

## 2017-01-24 DIAGNOSIS — N390 Urinary tract infection, site not specified: Secondary | ICD-10-CM | POA: Diagnosis not present

## 2017-01-24 DIAGNOSIS — R0602 Shortness of breath: Secondary | ICD-10-CM | POA: Diagnosis not present

## 2017-01-24 DIAGNOSIS — A4189 Other specified sepsis: Secondary | ICD-10-CM | POA: Diagnosis not present

## 2017-01-24 DIAGNOSIS — Z9989 Dependence on other enabling machines and devices: Secondary | ICD-10-CM | POA: Diagnosis not present

## 2017-01-24 DIAGNOSIS — R600 Localized edema: Secondary | ICD-10-CM | POA: Diagnosis not present

## 2017-01-24 DIAGNOSIS — I10 Essential (primary) hypertension: Secondary | ICD-10-CM | POA: Diagnosis present

## 2017-01-24 DIAGNOSIS — J9690 Respiratory failure, unspecified, unspecified whether with hypoxia or hypercapnia: Secondary | ICD-10-CM | POA: Diagnosis not present

## 2017-01-24 DIAGNOSIS — J9621 Acute and chronic respiratory failure with hypoxia: Secondary | ICD-10-CM | POA: Diagnosis not present

## 2017-01-24 DIAGNOSIS — J449 Chronic obstructive pulmonary disease, unspecified: Secondary | ICD-10-CM | POA: Diagnosis present

## 2017-01-24 DIAGNOSIS — J8489 Other specified interstitial pulmonary diseases: Secondary | ICD-10-CM | POA: Diagnosis not present

## 2017-01-24 DIAGNOSIS — R339 Retention of urine, unspecified: Secondary | ICD-10-CM | POA: Diagnosis not present

## 2017-01-24 DIAGNOSIS — J439 Emphysema, unspecified: Secondary | ICD-10-CM | POA: Diagnosis not present

## 2017-01-24 DIAGNOSIS — R310 Gross hematuria: Secondary | ICD-10-CM | POA: Diagnosis not present

## 2017-01-24 DIAGNOSIS — R918 Other nonspecific abnormal finding of lung field: Secondary | ICD-10-CM | POA: Diagnosis not present

## 2017-01-24 DIAGNOSIS — Z7982 Long term (current) use of aspirin: Secondary | ICD-10-CM | POA: Diagnosis not present

## 2017-01-24 DIAGNOSIS — Z931 Gastrostomy status: Secondary | ICD-10-CM | POA: Diagnosis not present

## 2017-02-03 DIAGNOSIS — Z9911 Dependence on respirator [ventilator] status: Secondary | ICD-10-CM | POA: Diagnosis not present

## 2017-02-03 DIAGNOSIS — R05 Cough: Secondary | ICD-10-CM | POA: Diagnosis not present

## 2017-02-03 DIAGNOSIS — R0603 Acute respiratory distress: Secondary | ICD-10-CM | POA: Diagnosis not present

## 2017-02-03 DIAGNOSIS — J9621 Acute and chronic respiratory failure with hypoxia: Secondary | ICD-10-CM | POA: Diagnosis not present

## 2017-02-03 DIAGNOSIS — K5730 Diverticulosis of large intestine without perforation or abscess without bleeding: Secondary | ICD-10-CM | POA: Diagnosis not present

## 2017-02-03 DIAGNOSIS — R0600 Dyspnea, unspecified: Secondary | ICD-10-CM | POA: Diagnosis not present

## 2017-02-03 DIAGNOSIS — E1122 Type 2 diabetes mellitus with diabetic chronic kidney disease: Secondary | ICD-10-CM | POA: Diagnosis not present

## 2017-02-03 DIAGNOSIS — F3289 Other specified depressive episodes: Secondary | ICD-10-CM | POA: Diagnosis not present

## 2017-02-03 DIAGNOSIS — A419 Sepsis, unspecified organism: Secondary | ICD-10-CM | POA: Diagnosis present

## 2017-02-03 DIAGNOSIS — K5900 Constipation, unspecified: Secondary | ICD-10-CM | POA: Diagnosis present

## 2017-02-03 DIAGNOSIS — Z452 Encounter for adjustment and management of vascular access device: Secondary | ICD-10-CM | POA: Diagnosis not present

## 2017-02-03 DIAGNOSIS — J690 Pneumonitis due to inhalation of food and vomit: Secondary | ICD-10-CM | POA: Diagnosis present

## 2017-02-03 DIAGNOSIS — J90 Pleural effusion, not elsewhere classified: Secondary | ICD-10-CM | POA: Diagnosis not present

## 2017-02-03 DIAGNOSIS — R7881 Bacteremia: Secondary | ICD-10-CM | POA: Diagnosis not present

## 2017-02-03 DIAGNOSIS — J9620 Acute and chronic respiratory failure, unspecified whether with hypoxia or hypercapnia: Secondary | ICD-10-CM | POA: Diagnosis not present

## 2017-02-03 DIAGNOSIS — E870 Hyperosmolality and hypernatremia: Secondary | ICD-10-CM | POA: Diagnosis present

## 2017-02-03 DIAGNOSIS — J9600 Acute respiratory failure, unspecified whether with hypoxia or hypercapnia: Secondary | ICD-10-CM | POA: Diagnosis not present

## 2017-02-03 DIAGNOSIS — R1310 Dysphagia, unspecified: Secondary | ICD-10-CM | POA: Diagnosis present

## 2017-02-03 DIAGNOSIS — B9562 Methicillin resistant Staphylococcus aureus infection as the cause of diseases classified elsewhere: Secondary | ICD-10-CM | POA: Diagnosis present

## 2017-02-03 DIAGNOSIS — R000 Tachycardia, unspecified: Secondary | ICD-10-CM | POA: Diagnosis not present

## 2017-02-03 DIAGNOSIS — J189 Pneumonia, unspecified organism: Secondary | ICD-10-CM | POA: Diagnosis not present

## 2017-02-03 DIAGNOSIS — K8020 Calculus of gallbladder without cholecystitis without obstruction: Secondary | ICD-10-CM | POA: Diagnosis not present

## 2017-02-03 DIAGNOSIS — M6281 Muscle weakness (generalized): Secondary | ICD-10-CM | POA: Diagnosis not present

## 2017-02-03 DIAGNOSIS — J168 Pneumonia due to other specified infectious organisms: Secondary | ICD-10-CM | POA: Diagnosis not present

## 2017-02-03 DIAGNOSIS — Z931 Gastrostomy status: Secondary | ICD-10-CM | POA: Diagnosis not present

## 2017-02-03 DIAGNOSIS — R4182 Altered mental status, unspecified: Secondary | ICD-10-CM | POA: Diagnosis not present

## 2017-02-03 DIAGNOSIS — Z743 Need for continuous supervision: Secondary | ICD-10-CM | POA: Diagnosis not present

## 2017-02-03 DIAGNOSIS — J441 Chronic obstructive pulmonary disease with (acute) exacerbation: Secondary | ICD-10-CM | POA: Diagnosis present

## 2017-02-03 DIAGNOSIS — Z1624 Resistance to multiple antibiotics: Secondary | ICD-10-CM | POA: Diagnosis present

## 2017-02-03 DIAGNOSIS — N390 Urinary tract infection, site not specified: Secondary | ICD-10-CM | POA: Diagnosis present

## 2017-02-03 DIAGNOSIS — T80211A Bloodstream infection due to central venous catheter, initial encounter: Secondary | ICD-10-CM | POA: Diagnosis not present

## 2017-02-03 DIAGNOSIS — D72829 Elevated white blood cell count, unspecified: Secondary | ICD-10-CM | POA: Diagnosis not present

## 2017-02-03 DIAGNOSIS — E872 Acidosis: Secondary | ICD-10-CM | POA: Diagnosis present

## 2017-02-03 DIAGNOSIS — D638 Anemia in other chronic diseases classified elsewhere: Secondary | ICD-10-CM | POA: Diagnosis present

## 2017-02-03 DIAGNOSIS — R3129 Other microscopic hematuria: Secondary | ICD-10-CM | POA: Diagnosis present

## 2017-02-03 DIAGNOSIS — N139 Obstructive and reflux uropathy, unspecified: Secondary | ICD-10-CM | POA: Diagnosis not present

## 2017-02-03 DIAGNOSIS — I4891 Unspecified atrial fibrillation: Secondary | ICD-10-CM | POA: Diagnosis not present

## 2017-02-03 DIAGNOSIS — M6259 Muscle wasting and atrophy, not elsewhere classified, multiple sites: Secondary | ICD-10-CM | POA: Diagnosis present

## 2017-02-03 DIAGNOSIS — K5909 Other constipation: Secondary | ICD-10-CM | POA: Diagnosis not present

## 2017-02-03 DIAGNOSIS — Z66 Do not resuscitate: Secondary | ICD-10-CM | POA: Diagnosis present

## 2017-02-03 DIAGNOSIS — I509 Heart failure, unspecified: Secondary | ICD-10-CM | POA: Diagnosis not present

## 2017-02-03 DIAGNOSIS — R0902 Hypoxemia: Secondary | ICD-10-CM | POA: Diagnosis not present

## 2017-02-03 DIAGNOSIS — R319 Hematuria, unspecified: Secondary | ICD-10-CM | POA: Diagnosis not present

## 2017-02-03 DIAGNOSIS — L89153 Pressure ulcer of sacral region, stage 3: Secondary | ICD-10-CM | POA: Diagnosis not present

## 2017-02-03 DIAGNOSIS — R0689 Other abnormalities of breathing: Secondary | ICD-10-CM | POA: Diagnosis not present

## 2017-02-03 DIAGNOSIS — R339 Retention of urine, unspecified: Secondary | ICD-10-CM | POA: Diagnosis not present

## 2017-02-03 DIAGNOSIS — J9622 Acute and chronic respiratory failure with hypercapnia: Secondary | ICD-10-CM | POA: Diagnosis not present

## 2017-02-03 DIAGNOSIS — J9811 Atelectasis: Secondary | ICD-10-CM | POA: Diagnosis not present

## 2017-02-03 DIAGNOSIS — Z515 Encounter for palliative care: Secondary | ICD-10-CM | POA: Diagnosis present

## 2017-02-03 DIAGNOSIS — J9601 Acute respiratory failure with hypoxia: Secondary | ICD-10-CM | POA: Diagnosis not present

## 2017-02-03 DIAGNOSIS — B952 Enterococcus as the cause of diseases classified elsewhere: Secondary | ICD-10-CM | POA: Diagnosis present

## 2017-02-03 DIAGNOSIS — R5381 Other malaise: Secondary | ICD-10-CM | POA: Diagnosis not present

## 2017-02-03 DIAGNOSIS — N179 Acute kidney failure, unspecified: Secondary | ICD-10-CM | POA: Diagnosis not present

## 2017-02-03 DIAGNOSIS — Z23 Encounter for immunization: Secondary | ICD-10-CM | POA: Diagnosis not present

## 2017-02-03 DIAGNOSIS — E119 Type 2 diabetes mellitus without complications: Secondary | ICD-10-CM | POA: Diagnosis present

## 2017-02-03 DIAGNOSIS — I480 Paroxysmal atrial fibrillation: Secondary | ICD-10-CM | POA: Diagnosis present

## 2017-02-03 DIAGNOSIS — J449 Chronic obstructive pulmonary disease, unspecified: Secondary | ICD-10-CM | POA: Diagnosis not present

## 2017-02-14 DIAGNOSIS — E43 Unspecified severe protein-calorie malnutrition: Secondary | ICD-10-CM | POA: Diagnosis not present

## 2017-02-14 DIAGNOSIS — D72829 Elevated white blood cell count, unspecified: Secondary | ICD-10-CM | POA: Diagnosis not present

## 2017-02-14 DIAGNOSIS — I509 Heart failure, unspecified: Secondary | ICD-10-CM | POA: Diagnosis not present

## 2017-02-14 DIAGNOSIS — D638 Anemia in other chronic diseases classified elsewhere: Secondary | ICD-10-CM | POA: Diagnosis not present

## 2017-02-14 DIAGNOSIS — J9601 Acute respiratory failure with hypoxia: Secondary | ICD-10-CM | POA: Diagnosis not present

## 2017-02-14 DIAGNOSIS — N179 Acute kidney failure, unspecified: Secondary | ICD-10-CM | POA: Diagnosis not present

## 2017-02-14 DIAGNOSIS — J189 Pneumonia, unspecified organism: Secondary | ICD-10-CM | POA: Diagnosis not present

## 2017-02-14 DIAGNOSIS — G9340 Encephalopathy, unspecified: Secondary | ICD-10-CM | POA: Diagnosis not present

## 2017-02-14 DIAGNOSIS — B3749 Other urogenital candidiasis: Secondary | ICD-10-CM | POA: Diagnosis not present

## 2017-02-14 DIAGNOSIS — Z431 Encounter for attention to gastrostomy: Secondary | ICD-10-CM | POA: Diagnosis not present

## 2017-02-14 DIAGNOSIS — N390 Urinary tract infection, site not specified: Secondary | ICD-10-CM | POA: Diagnosis not present

## 2017-02-14 DIAGNOSIS — B952 Enterococcus as the cause of diseases classified elsewhere: Secondary | ICD-10-CM | POA: Diagnosis not present

## 2017-02-14 DIAGNOSIS — R6521 Severe sepsis with septic shock: Secondary | ICD-10-CM | POA: Diagnosis not present

## 2017-02-14 DIAGNOSIS — T80219D Unspecified infection due to central venous catheter, subsequent encounter: Secondary | ICD-10-CM | POA: Diagnosis not present

## 2017-02-14 DIAGNOSIS — E870 Hyperosmolality and hypernatremia: Secondary | ICD-10-CM | POA: Diagnosis not present

## 2017-02-14 DIAGNOSIS — J690 Pneumonitis due to inhalation of food and vomit: Secondary | ICD-10-CM | POA: Diagnosis not present

## 2017-02-14 DIAGNOSIS — R339 Retention of urine, unspecified: Secondary | ICD-10-CM | POA: Diagnosis not present

## 2017-02-14 DIAGNOSIS — R319 Hematuria, unspecified: Secondary | ICD-10-CM | POA: Diagnosis not present

## 2017-02-14 DIAGNOSIS — L89150 Pressure ulcer of sacral region, unstageable: Secondary | ICD-10-CM | POA: Diagnosis not present

## 2017-02-14 DIAGNOSIS — Z681 Body mass index (BMI) 19 or less, adult: Secondary | ICD-10-CM | POA: Diagnosis not present

## 2017-02-14 DIAGNOSIS — I480 Paroxysmal atrial fibrillation: Secondary | ICD-10-CM | POA: Diagnosis not present

## 2017-02-14 DIAGNOSIS — J449 Chronic obstructive pulmonary disease, unspecified: Secondary | ICD-10-CM | POA: Diagnosis not present

## 2017-02-14 DIAGNOSIS — A4102 Sepsis due to Methicillin resistant Staphylococcus aureus: Secondary | ICD-10-CM | POA: Diagnosis not present

## 2017-03-12 DEATH — deceased

## 2017-04-12 DEATH — deceased
